# Patient Record
Sex: Female | Born: 1945
Health system: Southern US, Community
[De-identification: ages and names within clinical notes are randomized; demographics above are authoritative.]

## PROBLEM LIST (undated history)

## (undated) DIAGNOSIS — D229 Melanocytic nevi, unspecified: Secondary | ICD-10-CM

## (undated) DIAGNOSIS — M1990 Unspecified osteoarthritis, unspecified site: Secondary | ICD-10-CM

## (undated) DIAGNOSIS — R011 Cardiac murmur, unspecified: Secondary | ICD-10-CM

## (undated) DIAGNOSIS — I341 Nonrheumatic mitral (valve) prolapse: Secondary | ICD-10-CM

## (undated) DIAGNOSIS — I219 Acute myocardial infarction, unspecified: Secondary | ICD-10-CM

## (undated) DIAGNOSIS — G5600 Carpal tunnel syndrome, unspecified upper limb: Secondary | ICD-10-CM

## (undated) DIAGNOSIS — G709 Myoneural disorder, unspecified: Secondary | ICD-10-CM

## (undated) DIAGNOSIS — I8390 Asymptomatic varicose veins of unspecified lower extremity: Secondary | ICD-10-CM

## (undated) DIAGNOSIS — R519 Headache, unspecified: Secondary | ICD-10-CM

## (undated) DIAGNOSIS — F32A Depression, unspecified: Secondary | ICD-10-CM

## (undated) DIAGNOSIS — E7800 Pure hypercholesterolemia, unspecified: Secondary | ICD-10-CM

## (undated) DIAGNOSIS — H269 Unspecified cataract: Secondary | ICD-10-CM

## (undated) DIAGNOSIS — K219 Gastro-esophageal reflux disease without esophagitis: Secondary | ICD-10-CM

## (undated) DIAGNOSIS — L570 Actinic keratosis: Secondary | ICD-10-CM

## (undated) DIAGNOSIS — F329 Major depressive disorder, single episode, unspecified: Secondary | ICD-10-CM

## (undated) DIAGNOSIS — R51 Headache: Secondary | ICD-10-CM

## (undated) DIAGNOSIS — F419 Anxiety disorder, unspecified: Secondary | ICD-10-CM

## (undated) HISTORY — PX: EYE SURGERY: SHX253

## (undated) HISTORY — DX: Anxiety disorder, unspecified: F41.9

## (undated) HISTORY — DX: Unspecified cataract: H26.9

## (undated) HISTORY — DX: Melanocytic nevi, unspecified: D22.9

## (undated) HISTORY — DX: Cardiac murmur, unspecified: R01.1

## (undated) HISTORY — DX: Myoneural disorder, unspecified: G70.9

## (undated) HISTORY — PX: TUBAL LIGATION: SHX77

## (undated) HISTORY — PX: BREAST CYST ASPIRATION: SHX578

## (undated) HISTORY — DX: Acute myocardial infarction, unspecified: I21.9

## (undated) HISTORY — DX: Actinic keratosis: L57.0

---
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
## 1968-06-17 HISTORY — PX: LAPAROSCOPY: SHX197

## 1979-02-16 HISTORY — PX: BREAST CYST ASPIRATION: SHX578

## 1979-06-18 HISTORY — PX: OTHER SURGICAL HISTORY: SHX169

## 1988-06-17 HISTORY — PX: OTHER SURGICAL HISTORY: SHX169

## 2005-06-05 ENCOUNTER — Ambulatory Visit: Payer: Self-pay | Admitting: Family Medicine

## 2005-08-21 ENCOUNTER — Ambulatory Visit: Payer: Self-pay | Admitting: Family Medicine

## 2005-10-18 ENCOUNTER — Ambulatory Visit: Payer: Self-pay | Admitting: Unknown Physician Specialty

## 2005-10-23 ENCOUNTER — Ambulatory Visit: Payer: Self-pay | Admitting: Unknown Physician Specialty

## 2005-12-20 ENCOUNTER — Ambulatory Visit: Payer: Self-pay | Admitting: Family Medicine

## 2006-05-20 DIAGNOSIS — D239 Other benign neoplasm of skin, unspecified: Secondary | ICD-10-CM

## 2006-05-20 HISTORY — DX: Other benign neoplasm of skin, unspecified: D23.9

## 2006-12-23 ENCOUNTER — Ambulatory Visit: Payer: Self-pay | Admitting: Family Medicine

## 2007-12-04 ENCOUNTER — Ambulatory Visit: Payer: Self-pay | Admitting: Family Medicine

## 2007-12-14 ENCOUNTER — Ambulatory Visit: Payer: Self-pay | Admitting: Family Medicine

## 2007-12-29 ENCOUNTER — Ambulatory Visit: Payer: Self-pay | Admitting: Family Medicine

## 2008-11-07 DIAGNOSIS — F419 Anxiety disorder, unspecified: Secondary | ICD-10-CM | POA: Insufficient documentation

## 2008-11-07 DIAGNOSIS — G43909 Migraine, unspecified, not intractable, without status migrainosus: Secondary | ICD-10-CM | POA: Insufficient documentation

## 2008-12-08 ENCOUNTER — Ambulatory Visit: Payer: Self-pay | Admitting: Family Medicine

## 2008-12-08 DIAGNOSIS — M545 Low back pain, unspecified: Secondary | ICD-10-CM | POA: Insufficient documentation

## 2008-12-08 DIAGNOSIS — F32A Depression, unspecified: Secondary | ICD-10-CM | POA: Insufficient documentation

## 2008-12-29 ENCOUNTER — Ambulatory Visit: Payer: Self-pay | Admitting: Family Medicine

## 2009-09-08 DIAGNOSIS — K219 Gastro-esophageal reflux disease without esophagitis: Secondary | ICD-10-CM | POA: Insufficient documentation

## 2010-03-07 ENCOUNTER — Ambulatory Visit: Payer: Self-pay | Admitting: Family Medicine

## 2010-06-29 ENCOUNTER — Ambulatory Visit: Payer: Self-pay | Admitting: Unknown Physician Specialty

## 2010-06-29 LAB — HM COLONOSCOPY

## 2010-07-03 LAB — PATHOLOGY REPORT

## 2010-08-20 ENCOUNTER — Ambulatory Visit: Payer: Self-pay

## 2010-11-06 ENCOUNTER — Ambulatory Visit: Payer: Self-pay | Admitting: Sports Medicine

## 2011-05-02 ENCOUNTER — Ambulatory Visit: Payer: Self-pay | Admitting: Family Medicine

## 2011-05-14 ENCOUNTER — Ambulatory Visit: Payer: Self-pay | Admitting: Family Medicine

## 2011-06-06 ENCOUNTER — Ambulatory Visit: Payer: Self-pay | Admitting: Orthopedic Surgery

## 2011-06-18 HISTORY — PX: ROTATOR CUFF REPAIR: SHX139

## 2011-06-20 ENCOUNTER — Ambulatory Visit: Payer: Self-pay | Admitting: Orthopedic Surgery

## 2012-04-06 ENCOUNTER — Ambulatory Visit: Payer: Self-pay | Admitting: Family Medicine

## 2012-04-14 ENCOUNTER — Ambulatory Visit: Payer: Self-pay | Admitting: Family Medicine

## 2012-04-14 LAB — HM DEXA SCAN

## 2012-05-11 ENCOUNTER — Ambulatory Visit: Payer: Self-pay | Admitting: Family Medicine

## 2013-04-07 LAB — HM PAP SMEAR: HM Pap smear: NEGATIVE

## 2013-05-12 ENCOUNTER — Ambulatory Visit: Payer: Self-pay | Admitting: Family Medicine

## 2013-06-29 LAB — CBC AND DIFFERENTIAL
HEMATOCRIT: 40 % (ref 36–46)
Hemoglobin: 13.4 g/dL (ref 12.0–16.0)
Platelets: 287 10*3/uL (ref 150–399)
WBC: 5.6 10^3/mL

## 2013-06-29 LAB — TSH: TSH: 1.78 u[IU]/mL (ref <=5.90)

## 2014-05-18 ENCOUNTER — Ambulatory Visit: Payer: Self-pay | Admitting: Family Medicine

## 2014-05-18 LAB — HM MAMMOGRAPHY

## 2014-08-26 ENCOUNTER — Ambulatory Visit: Payer: Self-pay | Admitting: Family Medicine

## 2014-09-05 LAB — LIPID PANEL
CHOLESTEROL: 179 mg/dL (ref 0–200)
HDL: 69 mg/dL (ref 35–70)
LDL Cholesterol: 95 mg/dL
Triglycerides: 74 mg/dL (ref 40–160)

## 2014-10-07 LAB — HEPATIC FUNCTION PANEL
ALT: 8 U/L (ref 7–35)
AST: 25 U/L (ref 13–35)

## 2014-10-07 LAB — BASIC METABOLIC PANEL
BUN: 14 mg/dL (ref 4–21)
Creatinine: 1 mg/dL (ref 0.5–1.1)
Glucose: 101 mg/dL
Potassium: 4.1 mmol/L (ref 3.4–5.3)
Sodium: 142 mmol/L (ref 137–147)

## 2014-10-09 NOTE — Op Note (Signed)
PATIENT NAME:  Diana Blanchard, Diana Blanchard MR#:  [id number] DATE OF BIRTH:  19-Jul-1945  DATE OF PROCEDURE:  06/20/2011  PREOPERATIVE DIAGNOSIS: Right rotator cuff tear.   POSTOPERATIVE DIAGNOSIS: Right rotator cuff tear.   PROCEDURES:  1. Right rotator cuff repair.  2. Subacromial decompression.  ANESTHESIA: General.    SURGEON: Laurene Footman, MD   DESCRIPTION OF PROCEDURE: The patient was brought to the operating room and after adequate anesthesia was obtained the patient was placed in a semirecumbent position with a bump underneath the right shoulder blade. After prepping and draping in the usual sterile fashion a saber incision was made over the distal acromion and a deltoid on approach made to enter the subacromial space. Initial inspection revealed a great deal of bursitis within the subacromial space. This was debrided with use of a rongeur. The CA ligament was released and spur off the anterior aspect and lateral aspect of the acromion was removed with the use of an oscillating bur. At this point there was a tear noted at the anterior aspect of the rotator cuff approximately 1.5 cm in length without retraction. The tissue itself appeared to be thick around it. The tissue had obvious inflammation. The distal end of the stump was then debrided and underlying this there was a sharp spur on the greater tuberosity which was the partial cause of the impingement. This was debrided using rongeur getting fresh bleeding bone for the tendon to heal to as well as removing source of impingement. Two sutures were placed using the suture passing device from [employer]. Two suture anchors were then placed anterior and posterior using first tapping and then placing the speed screw. Speed screw was inserted. The anterior sutures were brought to the posterior anchor, tightened, and crimped with good tendon to bone apposition and then the more posterior sutures were brought to the more anterior one to get a good watertight  closure of the tendon without undue tension. After these sutures had been placed, there was stable fixation and the sutures were cut. The repair appeared stable through a range of motion. The wound was thoroughly irrigated and closed using #1 Vicryl for the deltoid, 2-0 Vicryl subcutaneously, and 4-0 nylon for the skin and a preop block. No additional local given. Xeroform, 4 x 4, ABD, tape, ACE wrap, and a shoulder sling applied. The patient was sent to the recovery room in stable condition.   ESTIMATED BLOOD LOSS: 25.   COMPLICATIONS: None.   SPECIMEN: None.   IMPLANTS: Speed screw anchor x2.  ____________________________ Laurene Footman, MD mjm:drc D: 06/20/2011 19:36:04 ET T: 06/21/2011 09:18:35 ET JOB#: [id number]  cc: Laurene Footman, MD, <Dictator> Laurene Footman MD ELECTRONICALLY SIGNED 06/21/2011 12:35

## 2015-03-03 ENCOUNTER — Telehealth: Payer: Self-pay | Admitting: Family Medicine

## 2015-03-03 DIAGNOSIS — Z1211 Encounter for screening for malignant neoplasm of colon: Secondary | ICD-10-CM | POA: Insufficient documentation

## 2015-03-03 NOTE — Telephone Encounter (Signed)
Pt's last Colonoscopy was 06/2010 with Dr. Vira Agar.    Thanks,   -Mickel Baas

## 2015-03-03 NOTE — Telephone Encounter (Signed)
Pt states [employer] has contacted her stating it is time to have her Colonoscopy.  Pt states she has an appt 06/05/2015 for CPE but was wanting to go ahead have her colonoscopy.  IC#[phone]/[id number]

## 2015-03-03 NOTE — Telephone Encounter (Signed)
Ok to put in order. Thanks.   

## 2015-04-05 ENCOUNTER — Ambulatory Visit (INDEPENDENT_AMBULATORY_CARE_PROVIDER_SITE_OTHER): Payer: Medicare Other

## 2015-04-05 DIAGNOSIS — Z23 Encounter for immunization: Secondary | ICD-10-CM

## 2015-05-31 DIAGNOSIS — K921 Melena: Secondary | ICD-10-CM | POA: Insufficient documentation

## 2015-05-31 DIAGNOSIS — R319 Hematuria, unspecified: Secondary | ICD-10-CM | POA: Insufficient documentation

## 2015-05-31 DIAGNOSIS — I341 Nonrheumatic mitral (valve) prolapse: Secondary | ICD-10-CM | POA: Insufficient documentation

## 2015-05-31 DIAGNOSIS — R011 Cardiac murmur, unspecified: Secondary | ICD-10-CM | POA: Insufficient documentation

## 2015-05-31 DIAGNOSIS — B009 Herpesviral infection, unspecified: Secondary | ICD-10-CM | POA: Insufficient documentation

## 2015-05-31 DIAGNOSIS — E7800 Pure hypercholesterolemia, unspecified: Secondary | ICD-10-CM | POA: Insufficient documentation

## 2015-05-31 DIAGNOSIS — M75100 Unspecified rotator cuff tear or rupture of unspecified shoulder, not specified as traumatic: Secondary | ICD-10-CM | POA: Insufficient documentation

## 2015-05-31 DIAGNOSIS — E875 Hyperkalemia: Secondary | ICD-10-CM | POA: Insufficient documentation

## 2015-06-02 ENCOUNTER — Ambulatory Visit (INDEPENDENT_AMBULATORY_CARE_PROVIDER_SITE_OTHER): Payer: Medicare Other | Admitting: Family Medicine

## 2015-06-02 ENCOUNTER — Encounter: Payer: Self-pay | Admitting: Family Medicine

## 2015-06-02 VITALS — BP 120/76 | HR 52 | Temp 98.1°F | Resp 16 | Ht 66.0 in | Wt 140.0 lb

## 2015-06-02 DIAGNOSIS — Z1211 Encounter for screening for malignant neoplasm of colon: Secondary | ICD-10-CM

## 2015-06-02 DIAGNOSIS — Z0000 Encounter for general adult medical examination without abnormal findings: Secondary | ICD-10-CM

## 2015-06-02 DIAGNOSIS — Z780 Asymptomatic menopausal state: Secondary | ICD-10-CM | POA: Diagnosis not present

## 2015-06-02 DIAGNOSIS — Z23 Encounter for immunization: Secondary | ICD-10-CM | POA: Diagnosis not present

## 2015-06-02 DIAGNOSIS — Z1239 Encounter for other screening for malignant neoplasm of breast: Secondary | ICD-10-CM

## 2015-06-02 NOTE — Patient Instructions (Signed)
Please call the [facility] at [facility] to schedule this at [phone]   

## 2015-06-02 NOTE — Progress Notes (Signed)
Patient ID: Mylia Eversman, female   DOB: 22-May-1946, 69 y.o.   MRN: [id number]        Patient: Jenneth Gilberti, Female    DOB: 1946-05-05, 69 y.o.   MRN: [id number] Visit Date: 06/02/2015  Today's Provider: Margarita Rana, MD   Chief Complaint  Patient presents with  . Medicare Wellness   Subjective:    Annual wellness visit Eibhlin Gornik is a 69 y.o. female. She feels well. She reports exercising active with daily activites. She reports she is sleeping well. 06/03/14 CPE 04/07/13 Pap-neg 05/18/14 Mammo-BI-RADS 1 06/29/10 Colon-internal hemorrhoids, polyps 04/14/12 BDM-osteoporosis  Lab Results  Component Value Date   WBC 5.6 06/29/2013   HGB 13.4 06/29/2013   HCT 40 06/29/2013   PLT 287 06/29/2013   CHOL 179 09/05/2014   TRIG 74 09/05/2014   HDL 69 09/05/2014   LDLCALC 95 09/05/2014   ALT 8 10/07/2014   AST 25 10/07/2014   NA 142 10/07/2014   K 4.1 10/07/2014   CREATININE 1.0 10/07/2014   BUN 14 10/07/2014   TSH 1.78 06/29/2013    -----------------------------------------------------------   Review of Systems  Constitutional: Negative.        Crying  HENT: Negative.   Eyes: Negative.   Respiratory: Negative.   Cardiovascular: Negative.   Gastrointestinal: Negative.   Endocrine: Negative.   Genitourinary: Positive for genital sores.  Musculoskeletal: Positive for back pain.  Skin: Negative.   Allergic/Immunologic: Negative.   Neurological: Negative.   Hematological: Negative.   Psychiatric/Behavioral: Negative.     Social History   Social History  . Marital Status: Married    Spouse Name: N/A  . Number of Children: N/A  . Years of Education: N/A   Occupational History  . Not on file.   Social History Main Topics  . Smoking status: Former Smoker    Quit date: 06/16/1969  . Smokeless tobacco: Never Used  . Alcohol Use: 1.8 oz/week    3 Glasses of wine per week  . Drug Use: No  . Sexual Activity: Not on file   Other Topics Concern  . Not on  file   Social History Narrative    Patient Active Problem List   Diagnosis Date Noted  . Blood in feces 05/31/2015  . Blood in the urine 05/31/2015  . Herpes simplex type 2 infection 05/31/2015  . Hypercholesterolemia 05/31/2015  . High potassium 05/31/2015  . Billowing mitral valve 05/31/2015  . Cardiac murmur 05/31/2015  . Rotator cuff syndrome 05/31/2015  . Encounter for screening colonoscopy 03/03/2015  . Acid reflux 09/08/2009  . Clinical depression 12/08/2008  . LBP (low back pain) 12/08/2008  . Anxiety 11/07/2008  . Headache, migraine 11/07/2008    Past Surgical History  Procedure Laterality Date  . Rotator cuff repair Right 06/2011  . Bowel obstruction  1990  . Tonsillectomy and adenoidectomy  1953  . Laparoscopy  123XX123    complications from IUD  . Cyst removed  1981    on the ovary    Her family history includes Arthritis in her mother; CVA in her father; Diabetes in her father; HIV in her brother; Hyperlipidemia in her mother; Hypertension in her mother.    Previous Medications   ASCORBIC ACID (VITAMIN C) 500 MG CAPS    Take 1 capsule by mouth daily.   CHOLECALCIFEROL (VITAMIN D PO)    Take 1 tablet by mouth daily.   ESCITALOPRAM (LEXAPRO) 20 MG TABLET    Take 1 tablet by mouth daily.  OMEGA-3 FATTY ACIDS (OMEGA-3 FISH OIL) 1200 MG CAPS    Take 1 capsule by mouth daily.   SIMVASTATIN (ZOCOR) 20 MG TABLET    Take 1 tablet by mouth daily.   VALACYCLOVIR (VALTREX) 500 MG TABLET    Take 500 mg by mouth 2 (two) times daily.    Patient Care Team: Margarita Rana, MD as PCP - General (Family Medicine)     Objective:   Vitals: BP 120/76 mmHg  Pulse 52  Temp(Src) 98.1 F (36.7 C) (Oral)  Resp 16  Ht 5\' 6"  (1.676 m)  Wt 140 lb (63.504 kg)  BMI 22.61 kg/m2  SpO2 98%  Physical Exam  Constitutional: She is oriented to person, place, and time. She appears well-developed and well-nourished.  HENT:  Head: Normocephalic and atraumatic.  Right Ear: Tympanic  membrane, external ear and ear canal normal.  Left Ear: Tympanic membrane, external ear and ear canal normal.  Nose: Nose normal.  Mouth/Throat: Uvula is midline, oropharynx is clear and moist and mucous membranes are normal.  Eyes: Conjunctivae, EOM and lids are normal. Pupils are equal, round, and reactive to light.  Neck: Trachea normal and normal range of motion. Neck supple. Carotid bruit is not present. No thyroid mass and no thyromegaly present.  Cardiovascular: Normal rate and regular rhythm.   Murmur heard. Pulmonary/Chest: Effort normal and breath sounds normal.  Abdominal: Soft. Normal appearance and bowel sounds are normal. There is no hepatosplenomegaly. There is no tenderness.  Genitourinary: No breast swelling, tenderness or discharge. There is lesion on the left labia.  Musculoskeletal: Normal range of motion.  Lymphadenopathy:    She has no cervical adenopathy.    She has no axillary adenopathy.  Neurological: She is alert and oriented to person, place, and time. She has normal strength. No cranial nerve deficit.  Skin: Skin is warm, dry and intact.  Psychiatric: She has a normal mood and affect. Her speech is normal and behavior is normal. Judgment and thought content normal. Cognition and memory are normal.    Activities of Daily Living In your present state of health, do you have any difficulty performing the following activities: 06/02/2015  Hearing? N  Vision? N  Difficulty concentrating or making decisions? N  Walking or climbing stairs? N  Dressing or bathing? N  Doing errands, shopping? N    Fall Risk Assessment Fall Risk  06/02/2015  Falls in the past year? No     Depression Screen PHQ 2/9 Scores 06/02/2015  PHQ - 2 Score 3  PHQ- 9 Score 3    Cognitive Testing - 6-CIT  Correct? Score   What year is it? yes 0 0 or 4  What month is it? yes 0 0 or 3  Memorize:    Pia Mau,  43,  [place],      What time is it? (within 1 hour) yes 0 0  or 3  Count backwards from 20 yes 0 0, 2, or 4  Name the months of the year yes 0 0, 2, or 4  Repeat name & address above yes 0 0, 2, 4, 6, 8, or 10       TOTAL SCORE  0/28   Interpretation:  Normal  Normal (0-7) Abnormal (8-28)       Assessment & Plan:     Annual Wellness Visit  Reviewed patient's Family Medical History Reviewed and updated list of patient's medical providers Assessment of cognitive impairment was done Assessed patient's functional ability  Established a written schedule for health screening [place] Completed and Reviewed  Exercise Activities and Dietary recommendations Goals    . Exercise 150 minutes per week (moderate activity)       Immunization History  Administered Date(s) Administered  . Influenza, High Dose Seasonal PF 04/05/2015  . Pneumococcal Conjugate-13 06/03/2014  . Pneumococcal Polysaccharide-23 06/17/1998, 04/07/2013  . Tdap 04/09/2005  . Zoster 01/13/2008       1. Medicare annual wellness visit, subsequent Stable. Patient advised to continue eating healthy and exercise daily.  2. Breast cancer screening - MM DIGITAL SCREENING BILATERAL; Future  3. Colon cancer screening - Ambulatory referral to Gastroenterology  4. Postmenopausal - DG Bone Density; Future  5. Need for Tdap vaccination - Tdap vaccine greater than or equal to 7yo IM     Patient seen and examined by Dr. Jerrell Belfast, and note scribed by Philbert Riser. Dimas, CMA. I have reviewed the document for accuracy and completeness and I agree with above. Jerrell Belfast, MD   Margarita Rana, MD    ------------------------------------------------------------------------------------------------------------

## 2015-06-05 ENCOUNTER — Encounter: Payer: Self-pay | Admitting: Family Medicine

## 2015-06-22 ENCOUNTER — Ambulatory Visit
Admission: RE | Admit: 2015-06-22 | Discharge: 2015-06-22 | Disposition: A | Discharge status: Home / Self Care | Payer: Medicare Other | Source: Ambulatory Visit | Attending: Family Medicine | Admitting: Family Medicine

## 2015-06-22 ENCOUNTER — Telehealth: Payer: Self-pay

## 2015-06-22 DIAGNOSIS — Z780 Asymptomatic menopausal state: Secondary | ICD-10-CM

## 2015-06-22 DIAGNOSIS — Z1231 Encounter for screening mammogram for malignant neoplasm of breast: Secondary | ICD-10-CM | POA: Insufficient documentation

## 2015-06-22 DIAGNOSIS — Z1239 Encounter for other screening for malignant neoplasm of breast: Secondary | ICD-10-CM

## 2015-06-22 NOTE — Telephone Encounter (Signed)
LMTCB Alaiza Yau Drozdowski, CMA  

## 2015-06-22 NOTE — Telephone Encounter (Signed)
-----   Message from Margarita Rana, MD sent at 06/22/2015  2:17 PM EST ----- Early stages of osteoporosis.   Recommend follow up ov to review.  Not emergent. Thanks.

## 2015-06-23 NOTE — Telephone Encounter (Signed)
Informed pt of results and scheduled appointment. Renaldo Fiddler, CMA

## 2015-06-28 ENCOUNTER — Encounter: Payer: Self-pay | Admitting: *Deleted

## 2015-06-29 ENCOUNTER — Ambulatory Visit: Payer: Medicare Other | Admitting: Anesthesiology

## 2015-06-29 ENCOUNTER — Ambulatory Visit: Admission: AD | Admit: 2015-06-29 | Payer: Self-pay | Source: Ambulatory Visit | Admitting: Unknown Physician Specialty

## 2015-06-29 ENCOUNTER — Encounter: Admission: AD | Payer: Self-pay | Source: Ambulatory Visit

## 2015-06-29 ENCOUNTER — Encounter: Payer: Self-pay | Admitting: *Deleted

## 2015-06-29 ENCOUNTER — Encounter
Admission: RE | Disposition: A | Discharge status: Home / Self Care | Payer: Self-pay | Source: Ambulatory Visit | Attending: Unknown Physician Specialty

## 2015-06-29 ENCOUNTER — Ambulatory Visit
Admission: RE | Admit: 2015-06-29 | Discharge: 2015-06-29 | Disposition: A | Discharge status: Home / Self Care | Payer: Medicare Other | Source: Ambulatory Visit | Attending: Unknown Physician Specialty | Admitting: Unknown Physician Specialty

## 2015-06-29 DIAGNOSIS — Z8261 Family history of arthritis: Secondary | ICD-10-CM | POA: Insufficient documentation

## 2015-06-29 DIAGNOSIS — E7800 Pure hypercholesterolemia, unspecified: Secondary | ICD-10-CM | POA: Diagnosis not present

## 2015-06-29 DIAGNOSIS — I059 Rheumatic mitral valve disease, unspecified: Secondary | ICD-10-CM | POA: Diagnosis not present

## 2015-06-29 DIAGNOSIS — Z8489 Family history of other specified conditions: Secondary | ICD-10-CM | POA: Diagnosis not present

## 2015-06-29 DIAGNOSIS — Z833 Family history of diabetes mellitus: Secondary | ICD-10-CM | POA: Diagnosis not present

## 2015-06-29 DIAGNOSIS — K640 First degree hemorrhoids: Secondary | ICD-10-CM | POA: Diagnosis not present

## 2015-06-29 DIAGNOSIS — Z823 Family history of stroke: Secondary | ICD-10-CM | POA: Diagnosis not present

## 2015-06-29 DIAGNOSIS — Z8249 Family history of ischemic heart disease and other diseases of the circulatory system: Secondary | ICD-10-CM | POA: Insufficient documentation

## 2015-06-29 DIAGNOSIS — R51 Headache: Secondary | ICD-10-CM | POA: Insufficient documentation

## 2015-06-29 DIAGNOSIS — F329 Major depressive disorder, single episode, unspecified: Secondary | ICD-10-CM | POA: Insufficient documentation

## 2015-06-29 DIAGNOSIS — K219 Gastro-esophageal reflux disease without esophagitis: Secondary | ICD-10-CM | POA: Diagnosis not present

## 2015-06-29 DIAGNOSIS — F419 Anxiety disorder, unspecified: Secondary | ICD-10-CM | POA: Diagnosis not present

## 2015-06-29 DIAGNOSIS — Z79899 Other long term (current) drug therapy: Secondary | ICD-10-CM | POA: Diagnosis not present

## 2015-06-29 DIAGNOSIS — Z86010 Personal history of colonic polyps: Secondary | ICD-10-CM | POA: Diagnosis present

## 2015-06-29 DIAGNOSIS — Z87891 Personal history of nicotine dependence: Secondary | ICD-10-CM | POA: Insufficient documentation

## 2015-06-29 HISTORY — DX: Depression, unspecified: F32.A

## 2015-06-29 HISTORY — DX: Gastro-esophageal reflux disease without esophagitis: K21.9

## 2015-06-29 HISTORY — DX: Headache, unspecified: R51.9

## 2015-06-29 HISTORY — PX: COLONOSCOPY WITH PROPOFOL: SHX5780

## 2015-06-29 HISTORY — DX: Major depressive disorder, single episode, unspecified: F32.9

## 2015-06-29 HISTORY — DX: Pure hypercholesterolemia, unspecified: E78.00

## 2015-06-29 HISTORY — DX: Nonrheumatic mitral (valve) prolapse: I34.1

## 2015-06-29 HISTORY — DX: Headache: R51

## 2015-06-29 SURGERY — COLONOSCOPY WITH PROPOFOL
Anesthesia: General | Laterality: Bilateral

## 2015-06-29 SURGERY — COLONOSCOPY WITH PROPOFOL
Anesthesia: General

## 2015-06-29 MED ORDER — SODIUM CHLORIDE 0.9 % IV SOLN
INTRAVENOUS | Status: DC | PRN
  Administered 2015-06-29: 11:00:00 via INTRAVENOUS

## 2015-06-29 MED ORDER — SODIUM CHLORIDE 0.9 % IV SOLN
INTRAVENOUS | Status: DC
  Administered 2015-06-29: 10:00:00 via INTRAVENOUS

## 2015-06-29 MED ORDER — PROPOFOL 10 MG/ML IV BOLUS
INTRAVENOUS | Status: DC | PRN
  Administered 2015-06-29: 150 mg via INTRAVENOUS

## 2015-06-29 MED ORDER — SODIUM CHLORIDE 0.9 % IV SOLN: INTRAVENOUS | Status: DC

## 2015-06-29 NOTE — Transfer of Care (Signed)
Immediate Anesthesia Transfer of Care Note  Patient: Diana Blanchard  Procedure(s) Performed: Procedure(s): COLONOSCOPY WITH PROPOFOL (N/A)  Patient Location: PACU  Anesthesia Type:General  Level of Consciousness: awake, alert  and oriented  Airway & Oxygen Therapy: Patient Spontanous Breathing and Patient connected to nasal cannula oxygen  Post-op Assessment: Report given to RN and Post -op Vital signs reviewed and stable  Post vital signs: Reviewed and stable  Last Vitals:  Filed Vitals:   06/29/15 0947 06/29/15 1112  BP: 123/79 96/64  Pulse: 54 47  Temp: 36.3 C 36.4 C  Resp: 14 16    Complications: No apparent anesthesia complications

## 2015-06-29 NOTE — Op Note (Signed)
[facility] Patient Name: Diana Blanchard Procedure Date: 06/29/2015 10:36 AM MRN: [id number] Account #: [id number] Date of Birth: [birth year] Admit Type: Outpatient Age: 70 Room: [facility] ENDO ROOM 1 Gender: Female Note Status: Finalized Procedure:         Colonoscopy Indications:       High risk colon cancer surveillance: Personal history of                     colonic polyps Providers:         Manya Silvas, MD Referring MD:      Jerrell Belfast, MD (Referring MD) Medicines:         Propofol per Anesthesia Complications:     No immediate complications. Procedure:         Pre-Anesthesia Assessment:                    - After reviewing the risks and benefits, the patient was                     deemed in satisfactory condition to undergo the procedure.                    After obtaining informed consent, the colonoscope was                     passed under direct vision. Throughout the procedure, the                     patient's blood pressure, pulse, and oxygen saturations                     were monitored continuously. The Colonoscope was                     introduced through the anus and advanced to the the cecum,                     identified by appendiceal orifice and ileocecal valve. The                     colonoscopy was somewhat difficult due to significant                     looping and a tortuous colon. Successful completion of the                     procedure was aided by applying abdominal pressure. The                     patient tolerated the procedure well. The quality of the                     bowel preparation was excellent. Findings:      Internal hemorrhoids were found during endoscopy. The hemorrhoids were       small and Grade I (internal hemorrhoids that do not prolapse). The       sigmoid was difficult and required withdrawal and reinsertion.      The exam was otherwise without abnormality. Impression:        -  Internal hemorrhoids.                    - The examination was otherwise normal.                    -  No specimens collected. Recommendation:    - Repeat colonoscopy in 5 years for surveillance. Manya Silvas, MD 06/29/2015 11:11:24 AM This report has been signed electronically. Number of Addenda: 0 Note Initiated On: 06/29/2015 10:36 AM Scope Withdrawal Time: 0 hours 6 minutes 29 seconds  Total Procedure Duration: 0 hours 22 minutes 10 seconds       [facility]

## 2015-06-29 NOTE — Anesthesia Preprocedure Evaluation (Signed)
Anesthesia Evaluation  Patient identified by MRN, date of birth, ID band Patient awake    Reviewed: Allergy & Precautions, H&P , NPO status , Patient's Chart, lab work & pertinent test results, reviewed documented beta blocker date and time   Airway Mallampati: II  TM Distance: >3 FB Neck ROM: full    Dental no notable dental hx. (+) Teeth Intact   Pulmonary neg pulmonary ROS, former smoker,    Pulmonary exam normal breath sounds clear to auscultation       Cardiovascular Exercise Tolerance: Good negative cardio ROS   Rhythm:regular Rate:Normal     Neuro/Psych  Headaches, PSYCHIATRIC DISORDERS negative neurological ROS  negative psych ROS   GI/Hepatic negative GI ROS, Neg liver ROS, GERD  ,  Endo/Other  negative endocrine ROS  Renal/GU negative Renal ROS  negative genitourinary   Musculoskeletal   Abdominal   Peds  Hematology negative hematology ROS (+)   Anesthesia Other Findings   Reproductive/Obstetrics negative OB ROS                             Anesthesia Physical Anesthesia Plan  ASA: III  Anesthesia Plan: General   Post-op Pain Management:    Induction:   Airway Management Planned:   Additional Equipment:   Intra-op Plan:   Post-operative Plan:   Informed Consent: I have reviewed the patients History and Physical, chart, labs and discussed the procedure including the risks, benefits and alternatives for the proposed anesthesia with the patient or authorized representative who has indicated his/her understanding and acceptance.   Dental Advisory Given  Plan Discussed with: CRNA  Anesthesia Plan Comments:         Anesthesia Quick Evaluation

## 2015-06-29 NOTE — H&P (Signed)
   Primary Care Physician:  Margarita Rana, MD Primary Gastroenterologist:  Dr. Vira Agar  Pre-Procedure History & Physical: HPI:  Diana Blanchard is a 70 y.o. female is here for an colonoscopy.   Past Medical History  Diagnosis Date  . Anxiety   . Hypercholesterolemia   . Billowing mitral valve   . GERD (gastroesophageal reflux disease)   . Depression   . Headache     Past Surgical History  Procedure Laterality Date  . Rotator cuff repair Right 06/2011  . Bowel obstruction  1990  . Tonsillectomy and adenoidectomy  1953  . Laparoscopy  123XX123    complications from IUD  . Cyst removed  1981    on the ovary  . Breast cyst aspiration Right 1980's    Prior to Admission medications   Medication Sig Start Date End Date Taking? Authorizing Provider  escitalopram (LEXAPRO) 20 MG tablet Take 1 tablet by mouth daily. 07/01/14  Yes Historical Provider, MD  simvastatin (ZOCOR) 20 MG tablet Take 1 tablet by mouth daily. 07/01/14  Yes Historical Provider, MD  Ascorbic Acid (VITAMIN C) 500 MG CAPS Take 1 capsule by mouth daily.    Historical Provider, MD  Cholecalciferol (VITAMIN D PO) Take 1 tablet by mouth daily. 09/08/09   Historical Provider, MD  Omega-3 Fatty Acids (OMEGA-3 FISH OIL) 1200 MG CAPS Take 1 capsule by mouth daily. 03/01/10   Historical Provider, MD  valACYclovir (VALTREX) 500 MG tablet Take 500 mg by mouth 2 (two) times daily. 05/24/15   Historical Provider, MD    Allergies as of 06/27/2015  . (No Known Allergies)    Family History  Problem Relation Age of Onset  . Arthritis Mother   . Hyperlipidemia Mother   . Hypertension Mother   . Diabetes Father   . CVA Father   . HIV Brother   . Breast cancer Neg Hx     Social History   Social History  . Marital Status: Married    Spouse Name: N/A  . Number of Children: N/A  . Years of Education: N/A   Occupational History  . Not on file.   Social History Main Topics  . Smoking status: Former Smoker    Quit date:  06/16/1969  . Smokeless tobacco: Never Used  . Alcohol Use: 1.8 oz/week    3 Glasses of wine per week  . Drug Use: No  . Sexual Activity: Not on file   Other Topics Concern  . Not on file   Social History Narrative    Review of Systems: See HPI, otherwise negative ROS  Physical Exam: BP 123/79 mmHg  Pulse 54  Temp(Src) 97.4 F (36.3 C) (Tympanic)  Resp 14  Ht 5\' 6"  (1.676 m)  Wt 62.143 kg (137 lb)  BMI 22.12 kg/m2  SpO2 100% General:   Alert,  pleasant and cooperative in NAD Head:  Normocephalic and atraumatic. Neck:  Supple; no masses or thyromegaly. Lungs:  Clear throughout to auscultation.    Heart:  Regular rate and rhythm. Abdomen:  Soft, nontender and nondistended. Normal bowel sounds, without guarding, and without rebound.   Neurologic:  Alert and  oriented x4;  grossly normal neurologically.  Impression/Plan: Diana Blanchard is here for an colonoscopy to be performed for [facility] colon polyps  Risks, benefits, limitations, and alternatives regarding  colonoscopy have been reviewed with the patient.  Questions have been answered.  All parties agreeable.   Gaylyn Cheers, MD  06/29/2015, 10:35 AM

## 2015-07-02 NOTE — Anesthesia Postprocedure Evaluation (Signed)
Anesthesia Post Note  Patient: Diana Blanchard  Procedure(s) Performed: Procedure(s) (LRB): COLONOSCOPY WITH PROPOFOL (N/A)  Patient location during evaluation: PACU Anesthesia Type: General Level of consciousness: awake and alert Pain management: pain level controlled Vital Signs Assessment: post-procedure vital signs reviewed and stable Respiratory status: spontaneous breathing, nonlabored ventilation, respiratory function stable and patient connected to nasal cannula oxygen Cardiovascular status: blood pressure returned to baseline and stable Postop Assessment: no signs of nausea or vomiting Anesthetic complications: no    Last Vitals:  Filed Vitals:   06/29/15 1130 06/29/15 1140  BP: 109/79 123/77  Pulse:    Temp:    Resp:      Last Pain:  Filed Vitals:   06/30/15 0754  PainSc: 0-No pain                 Molli Barrows

## 2015-07-04 ENCOUNTER — Ambulatory Visit (INDEPENDENT_AMBULATORY_CARE_PROVIDER_SITE_OTHER): Payer: Medicare Other | Admitting: Family Medicine

## 2015-07-04 ENCOUNTER — Encounter: Payer: Self-pay | Admitting: Family Medicine

## 2015-07-04 VITALS — BP 106/64 | HR 56 | Temp 98.8°F | Resp 16 | Ht 66.0 in | Wt 142.0 lb

## 2015-07-04 DIAGNOSIS — M810 Age-related osteoporosis without current pathological fracture: Secondary | ICD-10-CM | POA: Diagnosis not present

## 2015-07-04 MED ORDER — ASPIRIN 81 MG PO TABS: 81.0000 mg | ORAL_TABLET | Freq: Every day | ORAL | Status: DC

## 2015-07-04 NOTE — Patient Instructions (Signed)
Alendronate tablets What is this medicine? ALENDRONATE (a LEN droe nate) slows calcium loss from bones. It helps to make normal healthy bone and to slow bone loss in people with Paget's disease and osteoporosis. It may be used in others at risk for bone loss. This medicine may be used for other purposes; ask your health care provider or pharmacist if you have questions. What should I tell my health care provider before I take this medicine? They need to know if you have any of these conditions: -dental disease -esophagus, stomach, or intestine problems, like acid reflux or GERD -kidney disease -low blood calcium -low vitamin D -problems sitting or standing 30 minutes -trouble swallowing -an unusual or allergic reaction to alendronate, other medicines, foods, dyes, or preservatives -pregnant or trying to get pregnant -breast-feeding How should I use this medicine? You must take this medicine exactly as directed or you will lower the amount of the medicine you absorb into your body or you may cause yourself harm. Take this medicine by mouth first thing in the morning, after you are up for the day. Do not eat or drink anything before you take your medicine. Swallow the tablet with a full glass (6 to 8 fluid ounces) of plain water. Do not take this medicine with any other drink. Do not chew or crush the tablet. After taking this medicine, do not eat breakfast, drink, or take any medicines or vitamins for at least 30 minutes. Sit or stand up for at least 30 minutes after you take this medicine; do not lie down. Do not take your medicine more often than directed. Talk to your pediatrician regarding the use of this medicine in children. Special care may be needed. Overdosage: If you think you have taken too much of this medicine contact a poison control center or emergency room at once. NOTE: This medicine is only for you. Do not share this medicine with others. What if I miss a dose? If you miss a  dose, do not take it later in the day. Continue your normal schedule starting the next morning. Do not take double or extra doses. What may interact with this medicine? -aluminum hydroxide -antacids -aspirin -calcium supplements -drugs for inflammation like ibuprofen, naproxen, and others -iron supplements -magnesium supplements -vitamins with minerals This list may not describe all possible interactions. Give your health care provider a list of all the medicines, herbs, non-prescription drugs, or dietary supplements you use. Also tell them if you smoke, drink alcohol, or use illegal drugs. Some items may interact with your medicine. What should I watch for while using this medicine? Visit your doctor or health care professional for regular checks ups. It may be some time before you see benefit from this medicine. Do not stop taking your medicine except on your doctor's advice. Your doctor or health care professional may order blood tests and other tests to see how you are doing. You should make sure you get enough calcium and vitamin D while you are taking this medicine, unless your doctor tells you not to. Discuss the foods you eat and the vitamins you take with your health care professional. Some people who take this medicine have severe bone, joint, and/or muscle pain. This medicine may also increase your risk for a broken thigh bone. Tell your doctor right away if you have pain in your upper leg or groin. Tell your doctor if you have any pain that does not go away or that gets worse. This medicine can make  you more sensitive to the sun. If you get a rash while taking this medicine, sunlight may cause the rash to get worse. Keep out of the sun. If you cannot avoid being in the sun, wear protective clothing and use sunscreen. Do not use sun lamps or tanning beds/booths. What side effects may I notice from receiving this medicine? Side effects that you should report to your doctor or health care  professional as soon as possible: -allergic reactions like skin rash, itching or hives, swelling of the face, lips, or tongue -black or tarry stools -bone, muscle or joint pain -changes in vision -chest pain -heartburn or stomach pain -jaw pain, especially after dental work -pain or trouble when swallowing -redness, blistering, peeling or loosening of the skin, including inside the mouth Side effects that usually do not require medical attention (report to your doctor or health care professional if they continue or are bothersome): -changes in taste -diarrhea or constipation -eye pain or itching -headache -nausea or vomiting -stomach gas or fullness This list may not describe all possible side effects. Call your doctor for medical advice about side effects. You may report side effects to FDA at 1-800-FDA-1088. Where should I keep my medicine? Keep out of the reach of children. Store at room temperature of 15 and 30 degrees C (59 and 86 degrees F). Throw away any unused medicine after the expiration date. NOTE: This sheet is a summary. It may not cover all possible information. If you have questions about this medicine, talk to your doctor, pharmacist, or health care provider.    2016, Elsevier/Gold Standard. (2010-11-30 08:56:09) Osteoporosis Osteoporosis is the thinning and loss of density in the bones. Osteoporosis makes the bones more brittle, fragile, and likely to break (fracture). Over time, osteoporosis can cause the bones to become so weak that they fracture after a simple fall. The bones most likely to fracture are the bones in the hip, wrist, and spine. CAUSES  The exact cause is not known. RISK FACTORS Anyone can develop osteoporosis. You may be at greater risk if you have a family history of the condition or have poor nutrition. You may also have a higher risk if you are:   Female.   17 years old or older.  A smoker.  Not physically active.   White or  Asian.  Slender. SIGNS AND SYMPTOMS  A fracture might be the first sign of the disease, especially if it results from a fall or injury that would not usually cause a bone to break. Other signs and symptoms include:   Low back and neck pain.  Stooped posture.  Height loss. DIAGNOSIS  To make a diagnosis, your health care provider may:  Take a medical history.  Perform a physical exam.  Order tests, such as:  A bone mineral density test.  A dual-energy X-ray absorptiometry test. TREATMENT  The goal of osteoporosis treatment is to strengthen your bones to reduce your risk of a fracture. Treatment may involve:  Making lifestyle changes, such as:  Eating a diet rich in calcium.  Doing weight-bearing and muscle-strengthening exercises.  Stopping tobacco use.  Limiting alcohol intake.  Taking medicine to slow the process of bone loss or to increase bone density.  Monitoring your levels of calcium and vitamin D. HOME CARE INSTRUCTIONS  Include calcium and vitamin D in your diet. Calcium is important for bone health, and vitamin D helps the body absorb calcium.  Perform weight-bearing and muscle-strengthening exercises as directed by your health care  provider.  Do not use any tobacco products, including cigarettes, chewing tobacco, and electronic cigarettes. If you need help quitting, ask your health care provider.  Limit your alcohol intake.  Take medicines only as directed by your health care provider.  Keep all follow-up visits as directed by your health care provider. This is important.  Take precautions at home to lower your risk of falling, such as:  Keeping rooms well lit and clutter free.  Installing safety rails on stairs.  Using rubber mats in the bathroom and other areas that are often wet or slippery. SEEK IMMEDIATE MEDICAL CARE IF:  You fall or injure yourself.    This information is not intended to replace advice given to you by your health care  provider. Make sure you discuss any questions you have with your health care provider.   Document Released: 03/13/2005 Document Revised: 06/24/2014 Document Reviewed: 11/11/2013 Elsevier Interactive Patient Education [employer].

## 2015-07-04 NOTE — Progress Notes (Signed)
Patient ID: Diana Blanchard, female   DOB: 1946/01/26, 70 y.o.   MRN: [id number]        Patient: Diana Blanchard Female    DOB: [birth year]   70 y.o.   MRN: [id number] Visit Date: 07/04/2015  Today's Provider: Margarita Rana, MD   Chief Complaint  Patient presents with  . Osteoporosis   Subjective:    HPI   Osteoporosis: Patient complains of osteoporosis. She was diagnosed with osteoporosis by bone density scan in 06/22/2015 . Patient denies history of fracture.The cause of osteoporosis is felt to be due to postmenopausal estrogen deficiency.   She is currently being treated with calcium and vitamin D supplementation.  She is not currently being treated with bisphosphonates  Osteoporosis Risk Factors  Nonmodifiable Personal Hx of fracture as an adult: no Hx of fracture in first-degree relative: no   Caucasian race: yes  Advanced age: yes Female sex: yes Dementia: no Poor health/frailty: no  Potentially modifiable: Tobacco use: no Low body weight (<127 lbs): no Estrogen deficiency  early menopause (age <45) or bilateral ovariectomy: Pt is unsure  prolonged premenopausal amenorrhea (>1 yr): no Low calcium intake (lifelong): no Alcoholism: no Recurrent falls: yes Inadequate physical activity: yes  Current calcium and Vit D intake: Dietary sources: Yes.  Supplements: Taking Vitamin D 1,000 Units a day.      Allergies  Allergen Reactions  . Latex Itching  . Neosporin [Neomycin-Bacitracin Zn-Polymyx]    Previous Medications   ASCORBIC ACID (VITAMIN C) 500 MG CAPS    Take 1 capsule by mouth daily.   CHOLECALCIFEROL (VITAMIN D PO)    Take 1 tablet by mouth daily.   ESCITALOPRAM (LEXAPRO) 20 MG TABLET    Take 1 tablet by mouth daily.   OMEGA-3 FATTY ACIDS (OMEGA-3 FISH OIL) 1200 MG CAPS    Take 1 capsule by mouth daily.   SIMVASTATIN (ZOCOR) 20 MG TABLET    Take 1 tablet by mouth daily.   VALACYCLOVIR (VALTREX) 500 MG TABLET    Take 500 mg by mouth 2 (two) times daily.     Review of Systems  Constitutional: Negative.   Musculoskeletal: Negative.     Social History  Substance Use Topics  . Smoking status: Former Smoker    Quit date: 06/16/1969  . Smokeless tobacco: Never Used  . Alcohol Use: 1.8 oz/week    3 Glasses of wine per week   Objective:   BP 106/64 mmHg  Pulse 56  Temp(Src) 98.8 F (37.1 C) (Oral)  Resp 16  Ht 5\' 6"  (1.676 m)  Wt 142 lb (64.411 kg)  BMI 22.93 kg/m2  Physical Exam  Constitutional: She is oriented to person, place, and time. She appears well-developed and well-nourished.  Neurological: She is alert and oriented to person, place, and time.  Psychiatric: She has a normal mood and affect. Her behavior is normal. Judgment and thought content normal.      Assessment & Plan:     1. Osteoporosis New diagnosis.   Discussed risks and benefits of Fosamax. Patient will think about it and call me with her plan.  Spent over 30 minutes in direct patient counseling.   - VITAMIN D 25 Hydroxy (Vit-D Deficiency, Fractures)     Patient was seen and examined by Jerrell Belfast, MD, and note scribed by Ashley Royalty, CMA.    Margarita Rana, MD  [facility]

## 2015-08-11 DIAGNOSIS — M810 Age-related osteoporosis without current pathological fracture: Secondary | ICD-10-CM | POA: Diagnosis not present

## 2015-08-12 LAB — VITAMIN D 25 HYDROXY (VIT D DEFICIENCY, FRACTURES): Vit D, 25-Hydroxy: 38.3 ng/mL (ref 30.0–100.0)

## 2015-08-14 ENCOUNTER — Telehealth: Payer: Self-pay

## 2015-08-14 NOTE — Telephone Encounter (Signed)
Advised pt of lab results. Pt verbally acknowledges understanding. Emily Drozdowski, CMA   

## 2015-08-14 NOTE — Telephone Encounter (Signed)
-----   Message from Margarita Rana, MD sent at 08/12/2015  7:39 AM EST ----- Vit D stable. Please notify patient. Thanks.

## 2015-08-15 ENCOUNTER — Other Ambulatory Visit: Payer: Self-pay | Admitting: Family Medicine

## 2015-08-15 DIAGNOSIS — B009 Herpesviral infection, unspecified: Secondary | ICD-10-CM

## 2015-08-16 ENCOUNTER — Other Ambulatory Visit: Payer: Self-pay | Admitting: Family Medicine

## 2015-08-16 MED ORDER — ESCITALOPRAM OXALATE 20 MG PO TABS: 20.0000 mg | ORAL_TABLET | Freq: Every day | ORAL | Status: DC

## 2015-08-16 MED ORDER — SIMVASTATIN 20 MG PO TABS: 20.0000 mg | ORAL_TABLET | Freq: Every day | ORAL | Status: DC

## 2015-08-16 NOTE — Telephone Encounter (Signed)
Last refills for both medications was 07/01/2014.  Last OV was 07/04/2015

## 2015-08-16 NOTE — Telephone Encounter (Signed)
Pt needs refill on   escitalopram (LEXAPRO) 20 MG tablet  simvastatin (ZOCOR) 20 MG tablet  [facility].  Her call back is [phone]  Thanks Con Memos

## 2015-08-22 ENCOUNTER — Other Ambulatory Visit: Payer: Self-pay

## 2015-08-22 DIAGNOSIS — E7800 Pure hypercholesterolemia, unspecified: Secondary | ICD-10-CM

## 2015-08-22 DIAGNOSIS — F419 Anxiety disorder, unspecified: Secondary | ICD-10-CM

## 2015-08-22 MED ORDER — ESCITALOPRAM OXALATE 20 MG PO TABS: 20.0000 mg | ORAL_TABLET | Freq: Every day | ORAL | Status: DC

## 2015-08-22 MED ORDER — SIMVASTATIN 20 MG PO TABS: 20.0000 mg | ORAL_TABLET | Freq: Every day | ORAL | Status: DC

## 2015-08-24 ENCOUNTER — Other Ambulatory Visit: Payer: Self-pay

## 2015-08-24 DIAGNOSIS — F419 Anxiety disorder, unspecified: Secondary | ICD-10-CM

## 2015-08-24 DIAGNOSIS — E7800 Pure hypercholesterolemia, unspecified: Secondary | ICD-10-CM

## 2015-08-24 MED ORDER — ESCITALOPRAM OXALATE 20 MG PO TABS: 20.0000 mg | ORAL_TABLET | Freq: Every day | ORAL | Status: DC

## 2015-08-24 MED ORDER — SIMVASTATIN 20 MG PO TABS: 20.0000 mg | ORAL_TABLET | Freq: Every day | ORAL | Status: DC

## 2015-09-06 DIAGNOSIS — Z85828 Personal history of other malignant neoplasm of skin: Secondary | ICD-10-CM | POA: Diagnosis not present

## 2015-09-06 DIAGNOSIS — L821 Other seborrheic keratosis: Secondary | ICD-10-CM | POA: Diagnosis not present

## 2015-09-06 DIAGNOSIS — Z1283 Encounter for screening for malignant neoplasm of skin: Secondary | ICD-10-CM | POA: Diagnosis not present

## 2015-09-06 DIAGNOSIS — D1800 Hemangioma unspecified site: Secondary | ICD-10-CM | POA: Diagnosis not present

## 2015-09-06 DIAGNOSIS — L812 Freckles: Secondary | ICD-10-CM | POA: Diagnosis not present

## 2015-09-06 DIAGNOSIS — D229 Melanocytic nevi, unspecified: Secondary | ICD-10-CM | POA: Diagnosis not present

## 2015-09-06 DIAGNOSIS — D485 Neoplasm of uncertain behavior of skin: Secondary | ICD-10-CM | POA: Diagnosis not present

## 2015-09-06 DIAGNOSIS — L578 Other skin changes due to chronic exposure to nonionizing radiation: Secondary | ICD-10-CM | POA: Diagnosis not present

## 2015-09-06 DIAGNOSIS — L720 Epidermal cyst: Secondary | ICD-10-CM | POA: Diagnosis not present

## 2015-09-06 DIAGNOSIS — L820 Inflamed seborrheic keratosis: Secondary | ICD-10-CM | POA: Diagnosis not present

## 2016-04-11 ENCOUNTER — Ambulatory Visit (INDEPENDENT_AMBULATORY_CARE_PROVIDER_SITE_OTHER): Payer: Medicare Other

## 2016-04-11 DIAGNOSIS — Z23 Encounter for immunization: Secondary | ICD-10-CM | POA: Diagnosis not present

## 2016-05-14 ENCOUNTER — Ambulatory Visit (INDEPENDENT_AMBULATORY_CARE_PROVIDER_SITE_OTHER): Payer: Medicare Other | Admitting: Physician Assistant

## 2016-05-14 ENCOUNTER — Telehealth: Payer: Self-pay

## 2016-05-14 ENCOUNTER — Ambulatory Visit
Admission: RE | Admit: 2016-05-14 | Discharge: 2016-05-14 | Disposition: A | Discharge status: Home / Self Care | Payer: Medicare Other | Source: Ambulatory Visit | Attending: Physician Assistant | Admitting: Physician Assistant

## 2016-05-14 ENCOUNTER — Encounter: Payer: Self-pay | Admitting: Physician Assistant

## 2016-05-14 VITALS — BP 102/64 | HR 54 | Temp 97.8°F | Resp 16 | Wt 148.0 lb

## 2016-05-14 DIAGNOSIS — M25561 Pain in right knee: Secondary | ICD-10-CM | POA: Insufficient documentation

## 2016-05-14 DIAGNOSIS — M179 Osteoarthritis of knee, unspecified: Secondary | ICD-10-CM | POA: Diagnosis not present

## 2016-05-14 DIAGNOSIS — M769 Unspecified enthesopathy, lower limb, excluding foot: Secondary | ICD-10-CM | POA: Insufficient documentation

## 2016-05-14 NOTE — Telephone Encounter (Signed)
-----   Message from Trinna Post, [place] sent at 05/14/2016  1:19 PM EST ----- Mild arthritic changes but no acute abnormality on knee xray. Have put in orthopedic referral, patient will be contacted by Judson Roch.

## 2016-05-14 NOTE — Progress Notes (Signed)
Patient: Diana Blanchard Female    DOB: 03-18-1946   70 y.o.   MRN: [id number] Visit Date: 05/14/2016  Today's Provider: Trinna Post, PA-C   Chief Complaint  Patient presents with  . Knee Pain    Started in October   Subjective:    Knee Pain   There was no injury mechanism. The pain is present in the right knee. The quality of the pain is described as aching. The pain is at a severity of 5/10 (Can be as high as 7/10. ). Pertinent negatives include no inability to bear weight, loss of motion, loss of sensation, muscle weakness, numbness or tingling. The symptoms are aggravated by palpation and movement (Climbing steps.). She has tried nothing for the symptoms.   Patient is a 70 y/o female with history of hyperextension injury to her right knee many years ago presenting today with pain in the right knee. She reports the pain started in October with no injury mechanism that she can remember. She was on a trip to the beach and woke up one morning with her right knee hurting. She denies history of arthritis. She describes it as an aching on the inside of her right knee that gets worse with climbing stairs. She reports walking without issues but does feel her right knee buckling on occasion.   Allergies  Allergen Reactions  . Latex Itching  . Neosporin [Neomycin-Bacitracin Zn-Polymyx]      Current Outpatient Prescriptions:  .  Ascorbic Acid (VITAMIN C) 500 MG CAPS, Take 1 capsule by mouth daily., Disp: , Rfl:  .  Cholecalciferol (VITAMIN D PO), Take 1 tablet by mouth daily., Disp: , Rfl:  .  escitalopram (LEXAPRO) 20 MG tablet, Take 1 tablet (20 mg total) by mouth daily., Disp: 90 tablet, Rfl: 1 .  simvastatin (ZOCOR) 20 MG tablet, Take 1 tablet (20 mg total) by mouth daily., Disp: 90 tablet, Rfl: 1 .  valACYclovir (VALTREX) 500 MG tablet, TAKE ONE TABLET BY MOUTH TWICE DAILY, Disp: 12 tablet, Rfl: 5 .  aspirin 81 MG tablet, Take 1 tablet (81 mg total) by mouth daily. (Patient not  taking: Reported on 05/14/2016), Disp: 1 tablet, Rfl: 0  Review of Systems  Constitutional: Negative.   Musculoskeletal: Positive for arthralgias. Negative for back pain, gait problem, joint swelling, myalgias, neck pain and neck stiffness.  Neurological: Negative for dizziness, tingling, light-headedness, numbness and headaches.    Social History  Substance Use Topics  . Smoking status: Former Smoker    Quit date: 06/16/1969  . Smokeless tobacco: Never Used  . Alcohol use 1.8 oz/week    3 Glasses of wine per week   Objective:   BP 102/64 (BP Location: Right Arm, Patient Position: Sitting, Cuff Size: Normal)   Pulse (!) 54   Temp 97.8 F (36.6 C) (Oral)   Resp 16   Wt 148 lb (67.1 kg)   BMI 23.89 kg/m   Physical Exam  Constitutional: She is oriented to person, place, and time. She appears well-developed and well-nourished.  Cardiovascular: Normal rate.   Musculoskeletal: Normal range of motion. She exhibits tenderness. She exhibits no edema or deformity.       Right knee: She exhibits normal range of motion, no swelling, no effusion, no ecchymosis, no deformity, no laceration, no erythema, normal alignment, no LCL laxity, normal patellar mobility, no bony tenderness, normal meniscus and no MCL laxity. Tenderness found. MCL tenderness noted. No medial joint line, no lateral joint line and  no LCL tenderness noted.       Left knee: Normal.       Left ankle: Normal.  There is some tenderness along the right MCL. No laxity upon anterior or posterior drawer tests. No MCL or LCL laxity, but pain upon testing for this in the medial knee area. Some crepitus felt bilateral knees upon ROM. Negative McMurray test on right leg.  Neurological: She is alert and oriented to person, place, and time.        Assessment & Plan:      Problem List Items Addressed This Visit    None    Visit Diagnoses    Right knee pain, unspecified chronicity    -  Primary   Relevant Orders   DG Knee  Complete 4 Views Right     Patient is 70 y/o female presenting with right knee pain concerning for soft tissue injury. Does have a history of injury in this knee. Will get XRAY to evaluate bony etiology. Informed patient she probably has some baseline arthritis, but this does not sound like the cause of her pain. Will refer to ortho for further evaluation pending XRAY. She has not tried anything for pain, may use Tylenol for relief.   Return if symptoms worsen or fail to improve.  Patient Instructions  Knee Pain Knee pain is a very common symptom and can have many causes. Knee pain often goes away when you follow your health care provider's instructions for relieving pain and discomfort at home. However, knee pain can develop into a condition that needs treatment. Some conditions may include:  Arthritis caused by wear and tear (osteoarthritis).  Arthritis caused by swelling and irritation (rheumatoid arthritis or gout).  A cyst or growth in your knee.  An infection in your knee joint.  An injury that will not heal.  Damage, swelling, or irritation of the tissues that support your knee (torn ligaments or tendinitis). If your knee pain continues, additional tests may be ordered to diagnose your condition. Tests may include X-rays or other imaging studies of your knee. You may also need to have fluid removed from your knee. Treatment for ongoing knee pain depends on the cause, but treatment may include:  Medicines to relieve pain or swelling.  Steroid injections in your knee.  Physical therapy.  Surgery. HOME CARE INSTRUCTIONS  Take medicines only as directed by your health care provider.  Rest your knee and keep it raised (elevated) while you are resting.  Do not do things that cause or worsen pain.  Avoid high-impact activities or exercises, such as running, jumping rope, or doing jumping jacks.  Apply ice to the knee area:  Put ice in a plastic bag.  Place a towel between  your skin and the bag.  Leave the ice on for 20 minutes, 2-3 times a day.  Ask your health care provider if you should wear an elastic knee support.  Keep a pillow under your knee when you sleep.  Lose weight if you are overweight. Extra weight can put pressure on your knee.  Do not use any tobacco products, including cigarettes, chewing tobacco, or electronic cigarettes. If you need help quitting, ask your health care provider. Smoking may slow the healing of any bone and joint problems that you may have. SEEK MEDICAL CARE IF:  Your knee pain continues, changes, or gets worse.  You have a fever along with knee pain.  Your knee buckles or locks up.  Your knee becomes more  swollen. SEEK IMMEDIATE MEDICAL CARE IF:   Your knee joint feels hot to the touch.  You have chest pain or trouble breathing. This information is not intended to replace advice given to you by your health care provider. Make sure you discuss any questions you have with your health care provider. Document Released: 03/31/2007 Document Revised: 06/24/2014 Document Reviewed: 01/17/2014 Elsevier Interactive Patient Education  2017 [employer].    The entirety of the information documented in the History of Present Illness, Review of Systems and Physical Exam were personally obtained by me. Portions of this information were initially documented by Ashley Royalty, CMA and reviewed by me for thoroughness and accuracy.         Trinna Post, PA-C  [facility]

## 2016-05-14 NOTE — Telephone Encounter (Signed)
Patient has been notified. KW

## 2016-05-14 NOTE — Telephone Encounter (Signed)
[id number] 

## 2016-05-14 NOTE — Patient Instructions (Signed)

## 2016-06-07 ENCOUNTER — Ambulatory Visit (INDEPENDENT_AMBULATORY_CARE_PROVIDER_SITE_OTHER): Payer: Medicare Other | Admitting: Physician Assistant

## 2016-06-07 ENCOUNTER — Encounter: Payer: Self-pay | Admitting: Physician Assistant

## 2016-06-07 VITALS — BP 110/76 | HR 60 | Temp 98.3°F | Resp 16 | Ht 66.0 in | Wt 146.0 lb

## 2016-06-07 DIAGNOSIS — Z1239 Encounter for other screening for malignant neoplasm of breast: Secondary | ICD-10-CM

## 2016-06-07 DIAGNOSIS — B009 Herpesviral infection, unspecified: Secondary | ICD-10-CM | POA: Diagnosis not present

## 2016-06-07 DIAGNOSIS — R0789 Other chest pain: Secondary | ICD-10-CM | POA: Diagnosis not present

## 2016-06-07 DIAGNOSIS — Z0000 Encounter for general adult medical examination without abnormal findings: Secondary | ICD-10-CM | POA: Diagnosis not present

## 2016-06-07 DIAGNOSIS — E7800 Pure hypercholesterolemia, unspecified: Secondary | ICD-10-CM | POA: Diagnosis not present

## 2016-06-07 DIAGNOSIS — Z1231 Encounter for screening mammogram for malignant neoplasm of breast: Secondary | ICD-10-CM | POA: Diagnosis not present

## 2016-06-07 DIAGNOSIS — E875 Hyperkalemia: Secondary | ICD-10-CM | POA: Diagnosis not present

## 2016-06-07 DIAGNOSIS — F419 Anxiety disorder, unspecified: Secondary | ICD-10-CM | POA: Diagnosis not present

## 2016-06-07 MED ORDER — ESCITALOPRAM OXALATE 20 MG PO TABS: 20.0000 mg | ORAL_TABLET | Freq: Every day | ORAL | 1 refills | Status: DC

## 2016-06-07 MED ORDER — VALACYCLOVIR HCL 500 MG PO TABS: 500.0000 mg | ORAL_TABLET | Freq: Two times a day (BID) | ORAL | 3 refills | Status: DC

## 2016-06-07 MED ORDER — SIMVASTATIN 20 MG PO TABS: 20.0000 mg | ORAL_TABLET | Freq: Every day | ORAL | 1 refills | Status: DC

## 2016-06-07 NOTE — Progress Notes (Deleted)
Patient: Diana Blanchard, Female    DOB: May 03, 1946, 70 y.o.   MRN: [id number] Visit Date: 06/07/2016  Today's Provider: Mar Daring, PA-C   No chief complaint on file.  Subjective:    Annual physical exam Diana Blanchard is a 70 y.o. female who presents today for health maintenance and complete physical. She feels {DESC; WELL/FAIRLY WELL/POORLY:18703}. She reports exercising ***. She reports she is sleeping {DESC; WELL/FAIRLY WELL/POORLY:18703}.  Last CPE- 06/02/2015 Last BMD- 06/22/2015- OP Last mammo- 06/22/2015- BI-RADS 1 Last pap- 04/07/2013- negative -----------------------------------------------------------------   Review of Systems  Social History      She  reports that she quit smoking about 47 years ago. She has never used smokeless tobacco. She reports that she drinks about 1.8 oz of alcohol per week . She reports that she does not use drugs.       Social History   Social History  . Marital status: Married    Spouse name: N/A  . Number of children: N/A  . Years of education: N/A   Social History Main Topics  . Smoking status: Former Smoker    Quit date: 06/16/1969  . Smokeless tobacco: Never Used  . Alcohol use 1.8 oz/week    3 Glasses of wine per week  . Drug use: No  . Sexual activity: Not on file   Other Topics Concern  . Not on file   Social History Narrative  . No narrative on file    Past Medical History:  Diagnosis Date  . Anxiety   . Billowing mitral valve   . Depression   . GERD (gastroesophageal reflux disease)   . Headache   . Hypercholesterolemia      Patient Active Problem List   Diagnosis Date Noted  . Blood in the urine 05/31/2015  . Herpes simplex type 2 infection 05/31/2015  . Hypercholesterolemia 05/31/2015  . High potassium 05/31/2015  . Billowing mitral valve 05/31/2015  . Rotator cuff syndrome 05/31/2015  . Encounter for screening colonoscopy 03/03/2015  . Acid reflux 09/08/2009  . Clinical depression  12/08/2008  . LBP (low back pain) 12/08/2008  . Anxiety 11/07/2008  . Headache, migraine 11/07/2008    Past Surgical History:  Procedure Laterality Date  . bowel obstruction  1990  . BREAST CYST ASPIRATION Right 1980's  . COLONOSCOPY WITH PROPOFOL N/A 06/29/2015   Procedure: COLONOSCOPY WITH PROPOFOL;  Surgeon: Manya Silvas, MD;  Location: [facility] END[facility]COPY;  Service: Endoscopy;  Laterality: N/A;  . cyst removed  1981   on the ovary  . LAPAROSCOPY  [id number]   complications from IUD  . ROTATOR CUFF REPAIR Right 06/2011  . TONSILLECTOMY AND ADENOIDECTOMY  1953    Family History        Family Status  Relation Status  . Mother Alive  . Father Deceased at age 50   MI  . Brother Deceased        Her family history includes Arthritis in her mother; CVA in her father; Diabetes in her father; HIV in her brother; Hyperlipidemia in her mother; Hypertension in her mother.     Allergies  Allergen Reactions  . Latex Itching  . Neosporin [Neomycin-Bacitracin Zn-Polymyx]      Current Outpatient Prescriptions:  .  Ascorbic Acid (VITAMIN C) 500 MG CAPS, Take 1 capsule by mouth daily., Disp: , Rfl:  .  aspirin 81 MG tablet, Take 1 tablet (81 mg total) by mouth daily. (Patient not taking: Reported on 05/14/2016), Disp: 1  tablet, Rfl: 0 .  Cholecalciferol (VITAMIN D PO), Take 1 tablet by mouth daily., Disp: , Rfl:  .  escitalopram (LEXAPRO) 20 MG tablet, Take 1 tablet (20 mg total) by mouth daily., Disp: 90 tablet, Rfl: 1 .  simvastatin (ZOCOR) 20 MG tablet, Take 1 tablet (20 mg total) by mouth daily., Disp: 90 tablet, Rfl: 1 .  valACYclovir (VALTREX) 500 MG tablet, TAKE ONE TABLET BY MOUTH TWICE DAILY, Disp: 12 tablet, Rfl: 5   Patient Care Team: Mar Daring, PA-C as PCP - General (Family Medicine)      Objective:   Vitals: There were no vitals taken for this visit.   Physical Exam   Depression Screen PHQ 2/9 Scores 06/02/2015  PHQ - 2 Score 3  PHQ- 9 Score 3       Assessment & Plan:     Routine Health Maintenance and Physical Exam  Exercise Activities and Dietary recommendations Goals    . Exercise 150 minutes per week (moderate activity)       Immunization History  Administered Date(s) Administered  . Influenza, High Dose Seasonal PF 04/05/2015, 04/11/2016  . Pneumococcal Conjugate-13 06/03/2014  . Pneumococcal Polysaccharide-23 04/07/2013  . Tdap 04/09/2005, 06/02/2015  . Zoster 01/13/2008    Health Maintenance  Topic Date Due  . Hepatitis C Screening  [birth year]  . MAMMOGRAM  06/21/2017  . TETANUS/TDAP  06/01/2025  . COLONOSCOPY  06/28/2025  . INFLUENZA VACCINE  Completed  . DEXA SCAN  Completed  . ZOSTAVAX  Completed  . PNA vac Low Risk Adult  Completed     Discussed health benefits of physical activity, and encouraged her to engage in regular exercise appropriate for her age and condition.    --------------------------------------------------------------------  Patient seen and examined by Mar Daring, PA-C, and note scribed by Renaldo Fiddler, CMA.   Mar Daring, PA-C  [facility]

## 2016-06-07 NOTE — Progress Notes (Signed)
Patient: Diana Blanchard, Female    DOB: 04-12-1946, 70 y.o.   MRN: [id number] Visit Date: 06/07/2016  Today's Provider: Mar Daring, PA-C   Chief Complaint  Patient presents with  . Medicare Wellness   Subjective:    Annual wellness visit Diana Blanchard is a 70 y.o. female. She feels well. She reports exercising daily. Walks for 1 mile per day. She reports she is sleeping well.  Last AWV- 06/02/2015 Last BMD- 06/22/2015- OP Last pap- 04/07/2013- negative Last mammogram- 06/22/2015- negative. Last colonoscopy- 06/29/2015- Internal hemorrhoids. Otherwise without abnormality. Repeat 5 years for surveillance. Dr. Vira Agar Immunizations are UTD.  -----------------------------------------------------------   Review of Systems  Constitutional: Negative.   HENT: Negative.   Eyes: Negative.   Respiratory: Negative.   Cardiovascular: Negative.   Gastrointestinal: Negative.   Endocrine: Negative.   Genitourinary: Positive for genital sores. Negative for decreased urine volume, difficulty urinating, dyspareunia, dysuria, enuresis, flank pain, frequency, hematuria, menstrual problem, pelvic pain, urgency, vaginal bleeding, vaginal discharge and vaginal pain.  Musculoskeletal: Positive for neck stiffness. Negative for arthralgias, back pain, gait problem, joint swelling, myalgias and neck pain.  Skin: Negative.   Allergic/Immunologic: Negative.   Neurological: Positive for numbness. Negative for dizziness, tremors, seizures, syncope, facial asymmetry, speech difficulty, weakness, light-headedness and headaches.  Hematological: Negative.   Psychiatric/Behavioral: Negative.     Social History   Social History  . Marital status: Married    Spouse name: Merry Proud  . Number of children: 1  . Years of education: business college   Occupational History  . Rertired    Social History Main Topics  . Smoking status: Former Smoker    Quit date: 06/16/1969  . Smokeless tobacco:  Never Used  . Alcohol use 1.8 oz/week    3 Glasses of wine per week  . Drug use: No  . Sexual activity: Not on file   Other Topics Concern  . Not on file   Social History Narrative  . No narrative on file    Past Medical History:  Diagnosis Date  . Anxiety   . Billowing mitral valve   . Depression   . GERD (gastroesophageal reflux disease)   . Headache   . Hypercholesterolemia      Patient Active Problem List   Diagnosis Date Noted  . Blood in the urine 05/31/2015  . Herpes simplex type 2 infection 05/31/2015  . Hypercholesterolemia 05/31/2015  . High potassium 05/31/2015  . Billowing mitral valve 05/31/2015  . Rotator cuff syndrome 05/31/2015  . Encounter for screening colonoscopy 03/03/2015  . Acid reflux 09/08/2009  . Clinical depression 12/08/2008  . LBP (low back pain) 12/08/2008  . Anxiety 11/07/2008  . Headache, migraine 11/07/2008    Past Surgical History:  Procedure Laterality Date  . bowel obstruction  1990  . BREAST CYST ASPIRATION Right 1980's  . COLONOSCOPY WITH PROPOFOL N/A 06/29/2015   Procedure: COLONOSCOPY WITH PROPOFOL;  Surgeon: Manya Silvas, MD;  Location: [facility] ENDOSCOPY;  Service: Endoscopy;  Laterality: N/A;  . cyst removed  1981   on the ovary  . LAPAROSCOPY  [id number]   complications from IUD  . ROTATOR CUFF REPAIR Right 06/2011  . TONSILLECTOMY AND ADENOIDECTOMY  1953    Her family history includes Arthritis in her mother; CVA in her father; Diabetes in her father; HIV in her brother; Hyperlipidemia in her mother; Hypertension in her mother.      Current Outpatient Prescriptions:  .  Ascorbic Acid (VITAMIN C) 500  MG CAPS, Take 1 capsule by mouth daily., Disp: , Rfl:  .  Cholecalciferol (VITAMIN D PO), Take 1 tablet by mouth daily., Disp: , Rfl:  .  escitalopram (LEXAPRO) 20 MG tablet, Take 1 tablet (20 mg total) by mouth daily., Disp: 90 tablet, Rfl: 1 .  simvastatin (ZOCOR) 20 MG tablet, Take 1 tablet (20 mg total) by mouth daily.,  Disp: 90 tablet, Rfl: 1 .  valACYclovir (VALTREX) 500 MG tablet, TAKE ONE TABLET BY MOUTH TWICE DAILY, Disp: 12 tablet, Rfl: 5 .  aspirin 81 MG tablet, Take 1 tablet (81 mg total) by mouth daily. (Patient not taking: Reported on 06/07/2016), Disp: 1 tablet, Rfl: 0  Patient Care Team: Mar Daring, PA-C as PCP - General (Family Medicine)     Objective:   Vitals: BP 110/76 (BP Location: Left Arm, Patient Position: Sitting, Cuff Size: Normal)   Pulse 60   Temp 98.3 F (36.8 C) (Oral)   Resp 16   Ht 5\' 6"  (1.676 m)   Wt 146 lb (66.2 kg)   BMI 23.57 kg/m   Physical Exam  Constitutional: She is oriented to person, place, and time. She appears well-developed and well-nourished. No distress.  HENT:  Head: Normocephalic and atraumatic.  Right Ear: Tympanic membrane, external ear and ear canal normal.  Left Ear: Tympanic membrane, external ear and ear canal normal.  Nose: Nose normal.  Mouth/Throat: Uvula is midline, oropharynx is clear and moist and mucous membranes are normal. No oropharyngeal exudate.  Eyes: Conjunctivae and EOM are normal. Pupils are equal, round, and reactive to light. Right eye exhibits no discharge. Left eye exhibits no discharge. No scleral icterus.  Neck: Normal range of motion. Neck supple. No JVD present. No tracheal deviation present. No thyromegaly present.  Cardiovascular: Normal rate, regular rhythm, normal heart sounds and intact distal pulses.  Exam reveals no gallop and no friction rub.   No murmur heard. Pulmonary/Chest: Effort normal and breath sounds normal. No respiratory distress. She has no decreased breath sounds. She has no wheezes. She has no rales. She exhibits no tenderness. Right breast exhibits no inverted nipple, no mass, no nipple discharge, no skin change and no tenderness. Left breast exhibits no inverted nipple, no mass, no nipple discharge, no skin change and no tenderness. Breasts are symmetrical.  Abdominal: Soft. Bowel sounds are  normal. She exhibits no distension and no mass. There is no tenderness. There is no rebound and no guarding.  Musculoskeletal: Normal range of motion. She exhibits no edema or tenderness.  Lymphadenopathy:    She has no cervical adenopathy.  Neurological: She is alert and oriented to person, place, and time.  Skin: Skin is warm and dry. No rash noted. She is not diaphoretic.  Psychiatric: She has a normal mood and affect. Her behavior is normal. Judgment and thought content normal.  Vitals reviewed.   Activities of Daily Living In your present state of health, do you have any difficulty performing the following activities: 06/07/2016  Hearing? N  Vision? N  Difficulty concentrating or making decisions? N  Walking or climbing stairs? N  Dressing or bathing? N  Doing errands, shopping? N  Some recent data might be hidden    Fall Risk Assessment Fall Risk  06/07/2016 06/02/2015  Falls in the past year? No No     Depression Screen PHQ 2/9 Scores 06/07/2016 06/02/2015  PHQ - 2 Score 1 3  PHQ- 9 Score - 3    Cognitive Testing - 6-CIT  Correct?  Score   What year is it? yes 0 0 or 4  What month is it? yes 0 0 or 3  Memorize:    Pia Mau,  43,  [address],  [place],      What time is it? (within 1 hour) yes 0 0 or 3  Count backwards from 20 yes 0 0, 2, or 4  Name the months of the year yes 0 0, 2, or 4  Repeat name & address above yes 0 0, 2, 4, 6, 8, or 10       TOTAL SCORE  0/28   Interpretation:  Normal  Normal (0-7) Abnormal (8-28)       Assessment & Plan:     Annual Wellness Visit  Reviewed patient's Family Medical History Reviewed and updated list of patient's medical providers Assessment of cognitive impairment was done Assessed patient's functional ability Established a written schedule for health screening [place] Completed and Reviewed  Exercise Activities and Dietary recommendations Goals    . Exercise 150 minutes per week  (moderate activity)       Immunization History  Administered Date(s) Administered  . Influenza, High Dose Seasonal PF 04/05/2015, 04/11/2016  . Pneumococcal Conjugate-13 06/03/2014  . Pneumococcal Polysaccharide-23 04/07/2013  . Tdap 04/09/2005, 06/02/2015  . Zoster 01/13/2008    Health Maintenance  Topic Date Due  . Hepatitis C Screening  [birth year]  . MAMMOGRAM  06/21/2017  . TETANUS/TDAP  06/01/2025  . COLONOSCOPY  06/28/2025  . INFLUENZA VACCINE  Completed  . DEXA SCAN  Completed  . ZOSTAVAX  Completed  . PNA vac Low Risk Adult  Completed     Discussed health benefits of physical activity, and encouraged her to engage in regular exercise appropriate for her age and condition.    1. Medicare annual wellness visit, subsequent Normal physical exam.  2. Breast cancer screening Breast exam today was normal. There is no family history of breast cancer. She does perform regular self breast exams. Mammogram was ordered as below. Information for [facility] clinic was given to patient so she may schedule her mammogram at her convenience. - MM Digital Screening; Future  3. Hypercholesterolemia Will check labs as below and f/u pending results. - CBC with Differential/Platelet - TSH - Lipid panel - simvastatin (ZOCOR) 20 MG tablet; Take 1 tablet (20 mg total) by mouth daily.  Dispense: 90 tablet; Refill: 1  4. High potassium Will check labs as below and f/u pending results. - Comprehensive metabolic panel  5. Anxiety Stable. Diagnosis pulled for medication refill. Continue current medical treatment plan. - escitalopram (LEXAPRO) 20 MG tablet; Take 1 tablet (20 mg total) by mouth daily.  Dispense: 90 tablet; Refill: 1  6. Herpes simplex type 2 infection Stable. Diagnosis pulled for medication refill. Continue current medical treatment plan. - valACYclovir (VALTREX) 500 MG tablet; Take 1 tablet (500 mg total) by mouth 2 (two) times daily. As needed  Dispense: 30 tablet;  Refill: 3  7. Other chest pain EKG stable. Noted sinus bradycardia. No heart block noted. No ST elevation. Patient is not symptomatic.  - EKG 12-Lead  ------------------------------------------------------------------------------------------------------------  Patient seen and examined by Mar Daring, PA-C, and note scribed by Renaldo Fiddler, CMA.  Mar Daring, PA-C  [facility]

## 2016-06-07 NOTE — Patient Instructions (Signed)

## 2016-06-11 DIAGNOSIS — M25561 Pain in right knee: Secondary | ICD-10-CM | POA: Diagnosis not present

## 2016-06-11 DIAGNOSIS — M2391 Unspecified internal derangement of right knee: Secondary | ICD-10-CM | POA: Diagnosis not present

## 2016-06-13 DIAGNOSIS — E875 Hyperkalemia: Secondary | ICD-10-CM | POA: Diagnosis not present

## 2016-06-13 DIAGNOSIS — E7800 Pure hypercholesterolemia, unspecified: Secondary | ICD-10-CM | POA: Diagnosis not present

## 2016-06-14 ENCOUNTER — Telehealth: Payer: Self-pay

## 2016-06-14 LAB — COMPREHENSIVE METABOLIC PANEL
A/G RATIO: 2 (ref 1.2–2.2)
ALK PHOS: 73 IU/L (ref 39–117)
ALT: 11 IU/L (ref 0–32)
AST: 23 IU/L (ref 0–40)
Albumin: 4.2 g/dL (ref 3.6–4.8)
BILIRUBIN TOTAL: 0.3 mg/dL (ref 0.0–1.2)
BUN/Creatinine Ratio: 17 (ref 12–28)
BUN: 15 mg/dL (ref 8–27)
CHLORIDE: 103 mmol/L (ref 96–106)
CO2: 27 mmol/L (ref 18–29)
Calcium: 9.6 mg/dL (ref 8.7–10.3)
Creatinine, Ser: 0.88 mg/dL (ref 0.57–1.00)
GFR calc Af Amer: 78 mL/min/{1.73_m2} (ref >59)
GFR calc non Af Amer: 67 mL/min/{1.73_m2} (ref >59)
GLUCOSE: 81 mg/dL (ref 65–99)
Globulin, Total: 2.1 g/dL (ref 1.5–4.5)
POTASSIUM: 4.7 mmol/L (ref 3.5–5.2)
Sodium: 144 mmol/L (ref 134–144)
Total Protein: 6.3 g/dL (ref 6.0–8.5)

## 2016-06-14 LAB — LIPID PANEL
CHOL/HDL RATIO: 2.7 ratio (ref 0.0–4.4)
CHOLESTEROL TOTAL: 195 mg/dL (ref 100–199)
HDL: 73 mg/dL (ref >39)
LDL Calculated: 111 mg/dL — ABNORMAL HIGH (ref 0–99)
TRIGLYCERIDES: 56 mg/dL (ref 0–149)
VLDL Cholesterol Cal: 11 mg/dL (ref 5–40)

## 2016-06-14 LAB — CBC WITH DIFFERENTIAL/PLATELET
BASOS ABS: 0 10*3/uL (ref 0.0–0.2)
BASOS: 1 %
EOS (ABSOLUTE): 0.3 10*3/uL (ref 0.0–0.4)
Eos: 6 %
Hematocrit: 37.8 % (ref 34.0–46.6)
Hemoglobin: 12.6 g/dL (ref 11.1–15.9)
IMMATURE GRANULOCYTES: 0 %
Immature Grans (Abs): 0 10*3/uL (ref 0.0–0.1)
Lymphocytes Absolute: 1.7 10*3/uL (ref 0.7–3.1)
Lymphs: 36 %
MCH: 32 pg (ref 26.6–33.0)
MCHC: 33.3 g/dL (ref 31.5–35.7)
MCV: 96 fL (ref 79–97)
MONOS ABS: 0.5 10*3/uL (ref 0.1–0.9)
Monocytes: 10 %
NEUTROS ABS: 2.3 10*3/uL (ref 1.4–7.0)
NEUTROS PCT: 47 %
PLATELETS: 246 10*3/uL (ref 150–379)
RBC: 3.94 x10E6/uL (ref 3.77–5.28)
RDW: 12.7 % (ref 12.3–15.4)
WBC: 4.8 10*3/uL (ref 3.4–10.8)

## 2016-06-14 LAB — TSH: TSH: 2.52 u[IU]/mL (ref 0.450–4.500)

## 2016-06-14 NOTE — Telephone Encounter (Signed)
Patient advised as below.  

## 2016-06-14 NOTE — Telephone Encounter (Signed)
-----   Message from Mar Daring, [place] sent at 06/14/2016  8:18 AM EST ----- All labs are within normal limits and stable.  Thanks! -JB

## 2016-06-25 ENCOUNTER — Ambulatory Visit
Admission: RE | Admit: 2016-06-25 | Discharge: 2016-06-25 | Disposition: A | Discharge status: Home / Self Care | Payer: Medicare Other | Source: Ambulatory Visit | Attending: Physician Assistant | Admitting: Physician Assistant

## 2016-06-25 DIAGNOSIS — Z1239 Encounter for other screening for malignant neoplasm of breast: Secondary | ICD-10-CM

## 2016-06-25 DIAGNOSIS — Z1231 Encounter for screening mammogram for malignant neoplasm of breast: Secondary | ICD-10-CM | POA: Diagnosis not present

## 2016-08-06 ENCOUNTER — Ambulatory Visit (INDEPENDENT_AMBULATORY_CARE_PROVIDER_SITE_OTHER): Payer: Medicare Other | Admitting: Physician Assistant

## 2016-08-06 VITALS — BP 102/60 | HR 68 | Temp 98.5°F | Resp 16 | Wt 143.0 lb

## 2016-08-06 DIAGNOSIS — A084 Viral intestinal infection, unspecified: Secondary | ICD-10-CM | POA: Diagnosis not present

## 2016-08-06 MED ORDER — PROMETHAZINE HCL 25 MG PO TABS: 25.0000 mg | ORAL_TABLET | Freq: Three times a day (TID) | ORAL | 0 refills | Status: DC | PRN

## 2016-08-06 NOTE — Patient Instructions (Signed)
Viral Gastroenteritis, Adult Viral gastroenteritis is also known as the stomach flu. This condition is caused by various viruses. These viruses can be passed from person to person very easily (are very contagious). This condition may affect your stomach, small intestine, and large intestine. It can cause sudden watery diarrhea, fever, and vomiting. Diarrhea and vomiting can make you feel weak and cause you to become dehydrated. You may not be able to keep fluids down. Dehydration can make you tired and thirsty, cause you to have a dry mouth, and decrease how often you urinate. Older adults and people with other diseases or a weak immune system are at higher risk for dehydration. It is important to replace the fluids that you lose from diarrhea and vomiting. If you become severely dehydrated, you may need to get fluids through an IV tube. What are the causes? Gastroenteritis is caused by various viruses, including rotavirus and norovirus. Norovirus is the most common cause in adults. You can get sick by eating food, drinking water, or touching a surface contaminated with one of these viruses. You can also get sick from sharing utensils or other personal items with an infected person. What increases the risk? This condition is more likely to develop in people:  Who have a weak defense system (immune system).  Who live with one or more children who are younger than 34 years old.  Who live in a nursing home.  Who go on cruise ships. What are the signs or symptoms? Symptoms of this condition start suddenly 1-2 days after exposure to a virus. Symptoms may last a few days or as long as a week. The most common symptoms are watery diarrhea and vomiting. Other symptoms include:  Fever.  Headache.  Fatigue.  Pain in the abdomen.  Chills.  Weakness.  Nausea.  Muscle aches.  Loss of appetite. How is this diagnosed? This condition is diagnosed with a medical history and physical exam. You may  also have a stool test to check for viruses or other infections. How is this treated? This condition typically goes away on its own. The focus of treatment is to restore lost fluids (rehydration). Your health care provider may recommend that you take an oral rehydration solution (ORS) to replace important salts and minerals (electrolytes) in your body. Severe cases of this condition may require giving fluids through an IV tube. Treatment may also include medicine to help with your symptoms. Follow these instructions at home: Follow instructions from your health care provider about how to care for yourself at home. Eating and drinking Follow these recommendations as told by your health care provider:  Take an ORS. This is a drink that is sold at pharmacies and retail stores.  Drink clear fluids in small amounts as you are able. Clear fluids include water, ice chips, diluted fruit juice, and low-calorie sports drinks.  Eat bland, easy-to-digest foods in small amounts as you are able. These foods include bananas, applesauce, rice, lean meats, toast, and crackers.  Avoid fluids that contain a lot of sugar or caffeine, such as energy drinks, sports drinks, and soda.  Avoid alcohol.  Avoid spicy or fatty foods. General instructions  Drink enough fluid to keep your urine clear or pale yellow.  Wash your hands often. If soap and water are not available, use hand sanitizer.  Make sure that all people in your household wash their hands well and often.  Take over-the-counter and prescription medicines only as told by your health care provider.  Rest  at home while you recover.  Watch your condition for any changes.  Take a warm bath to relieve any burning or pain from frequent diarrhea episodes.  Keep all follow-up visits as told by your health care provider. This is important. Contact a health care provider if:  You cannot keep fluids down.  Your symptoms get worse.  You have new  symptoms.  You feel light-headed or dizzy.  You have muscle cramps. Get help right away if:  You have chest pain.  You feel extremely weak or you faint.  You see blood in your vomit.  Your vomit looks like coffee grounds.  You have bloody or black stools or stools that look like tar.  You have a severe headache, a stiff neck, or both.  You have a rash.  You have severe pain, cramping, or bloating in your abdomen.  You have trouble breathing or you are breathing very quickly.  Your heart is beating very quickly.  Your skin feels cold and clammy.  You feel confused.  You have pain when you urinate.  You have signs of dehydration, such as:  Dark urine, very little urine, or no urine.  Cracked lips.  Dry mouth.  Sunken eyes.  Sleepiness.  Weakness. This information is not intended to replace advice given to you by your health care provider. Make sure you discuss any questions you have with your health care provider. Document Released: 06/03/2005 Document Revised: 11/15/2015 Document Reviewed: 02/07/2015 Elsevier Interactive Patient Education  2017 [place] Choices to Help Relieve Diarrhea, Adult When you have diarrhea, the foods you eat and your eating habits are very important. Choosing the right foods and drinks can help relieve diarrhea. Also, because diarrhea can last up to 7 days, you need to replace lost fluids and electrolytes (such as sodium, potassium, and chloride) in order to help prevent dehydration. What general guidelines do I need to follow?  Slowly drink 1 cup (8 oz) of fluid for each episode of diarrhea. If you are getting enough fluid, your urine will be clear or pale yellow.  Eat starchy foods. Some good choices include white rice, white toast, pasta, low-fiber cereal, baked potatoes (without the skin), saltine crackers, and bagels.  Avoid large servings of any cooked vegetables.  Limit fruit to two servings per day. A serving is   cup or 1 small piece.  Choose foods with less than 2 g of fiber per serving.  Limit fats to less than 8 tsp (38 g) per day.  Avoid fried foods.  Eat foods that have probiotics in them. Probiotics can be found in certain dairy products.  Avoid foods and beverages that may increase the speed at which food moves through the stomach and intestines (gastrointestinal tract). Things to avoid include:  High-fiber foods, such as dried fruit, raw fruits and vegetables, nuts, seeds, and whole grain foods.  Spicy foods and high-fat foods.  Foods and beverages sweetened with high-fructose corn syrup, honey, or sugar alcohols such as xylitol, sorbitol, and mannitol. What foods are recommended? Grains  White rice. White, [place], or pita breads (fresh or toasted), including plain rolls, buns, or bagels. White pasta. Saltine, soda, or graham crackers. Pretzels. Low-fiber cereal. Cooked cereals made with water (such as cornmeal, farina, or cream cereals). Plain muffins. Matzo. Melba toast. Zwieback. Vegetables  Potatoes (without the skin). Strained tomato and vegetable juices. Most well-cooked and canned vegetables without seeds. Tender lettuce. Fruits  Cooked or canned applesauce, apricots, cherries, fruit cocktail, grapefruit, peaches,  pears, or plums. Fresh bananas, apples without skin, cherries, grapes, cantaloupe, grapefruit, peaches, oranges, or plums. Meat and Other Protein Products  Baked or boiled chicken. Eggs. Tofu. Fish. Seafood. Smooth peanut butter. Ground or well-cooked tender beef, ham, veal, lamb, pork, or poultry. Dairy  Plain yogurt, kefir, and unsweetened liquid yogurt. Lactose-free milk, buttermilk, or soy milk. Plain hard cheese. Beverages  Sport drinks. Clear broths. Diluted fruit juices (except prune). Regular, caffeine-free sodas such as ginger ale. Water. Decaffeinated teas. Oral rehydration solutions. Sugar-free beverages not sweetened with sugar alcohols. Other  Bouillon,  broth, or soups made from recommended foods. The items listed above may not be a complete list of recommended foods or beverages. Contact your dietitian for more options.  What foods are not recommended? Grains  Whole grain, whole wheat, bran, or rye breads, rolls, pastas, crackers, and cereals. Wild or brown rice. Cereals that contain more than 2 g of fiber per serving. Corn tortillas or taco shells. Cooked or dry oatmeal. Granola. Popcorn. Vegetables  Raw vegetables. Cabbage, broccoli, Brussels sprouts, artichokes, baked beans, beet greens, corn, kale, legumes, peas, sweet potatoes, and yams. Potato skins. Cooked spinach and cabbage. Fruits  Dried fruit, including raisins and dates. Raw fruits. Stewed or dried prunes. Fresh apples with skin, apricots, mangoes, pears, raspberries, and strawberries. Meat and Other Protein Products  Chunky peanut butter. Nuts and seeds. Beans and lentils. Berniece Salines. Dairy  High-fat cheeses. Milk, chocolate milk, and beverages made with milk, such as milk shakes. Cream. Ice cream. Sweets and Desserts  Sweet rolls, doughnuts, and sweet breads. Pancakes and waffles. Fats and Oils  Butter. Cream sauces. Margarine. Salad oils. Plain salad dressings. Olives. Avocados. Beverages  Caffeinated beverages (such as coffee, tea, soda, or energy drinks). Alcoholic beverages. Fruit juices with pulp. Prune juice. Soft drinks sweetened with high-fructose corn syrup or sugar alcohols. Other  Coconut. Hot sauce. Chili powder. Mayonnaise. Gravy. Cream-based or milk-based soups. The items listed above may not be a complete list of foods and beverages to avoid. Contact your dietitian for more information.  What should I do if I become dehydrated? Diarrhea can sometimes lead to dehydration. Signs of dehydration include dark urine and dry mouth and skin. If you think you are dehydrated, you should rehydrate with an oral rehydration solution. These solutions can be purchased at pharmacies,  retail stores, or online. Drink -1 cup (120-240 mL) of oral rehydration solution each time you have an episode of diarrhea. If drinking this amount makes your diarrhea worse, try drinking smaller amounts more often. For example, drink 1-3 tsp (5-15 mL) every 5-10 minutes. A general rule for staying hydrated is to drink 1-2 L of fluid per day. Talk to your health care provider about the specific amount you should be drinking each day. Drink enough fluids to keep your urine clear or pale yellow. This information is not intended to replace advice given to you by your health care provider. Make sure you discuss any questions you have with your health care provider. Document Released: 08/24/2003 Document Revised: 11/09/2015 Document Reviewed: 04/26/2013 Elsevier Interactive Patient Education  2017 [employer].

## 2016-08-06 NOTE — Progress Notes (Signed)
Diana Blanchard  MRN: [id number] DOB: 07/01/1945  Subjective:  HPI  The patient is a 22 year oild female who presents with about 24-30 hours of nausea, vomiting, diarrhea and fever.  She has only has a couple of sips of fluid today and has kept that in but has not attempted anything more than that.  Patient started with vomiting and within minutes had diarrhea.    She checked her temperature yesterday and it was about 99. She states she was fine over the weekend but was with a large crowd of people.  She does not have anyone else in the household right now that is sick.  Patient Active Problem List   Diagnosis Date Noted  . Blood in the urine 05/31/2015  . Herpes simplex type 2 infection 05/31/2015  . Hypercholesterolemia 05/31/2015  . High potassium 05/31/2015  . Billowing mitral valve 05/31/2015  . Rotator cuff syndrome 05/31/2015  . Encounter for screening colonoscopy 03/03/2015  . Acid reflux 09/08/2009  . Clinical depression 12/08/2008  . LBP (low back pain) 12/08/2008  . Anxiety 11/07/2008  . Headache, migraine 11/07/2008    Past Medical History:  Diagnosis Date  . Anxiety   . Billowing mitral valve   . Depression   . GERD (gastroesophageal reflux disease)   . Headache   . Hypercholesterolemia     Social History   Social History  . Marital status: Married    Spouse name: Diana Blanchard  . Number of children: 1  . Years of education: business college   Occupational History  . Rertired    Social History Main Topics  . Smoking status: Former Smoker    Quit date: 06/16/1969  . Smokeless tobacco: Never Used  . Alcohol use 1.8 oz/week    3 Glasses of wine per week  . Drug use: No  . Sexual activity: Not on file   Other Topics Concern  . Not on file   Social History Narrative  . No narrative on file    Outpatient Encounter Prescriptions as of 08/06/2016  Medication Sig Note  . Ascorbic Acid (VITAMIN C) 500 MG CAPS Take 1 capsule by mouth daily. 05/31/2015:  Received from: [place]: Take by mouth.  Marland Kitchen aspirin 81 MG tablet Take 1 tablet (81 mg total) by mouth daily.   . Cholecalciferol (VITAMIN D PO) Take 1 tablet by mouth daily. 05/31/2015: Received from: [place]:   . escitalopram (LEXAPRO) 20 MG tablet Take 1 tablet (20 mg total) by mouth daily.   . simvastatin (ZOCOR) 20 MG tablet Take 1 tablet (20 mg total) by mouth daily.   . valACYclovir (VALTREX) 500 MG tablet Take 1 tablet (500 mg total) by mouth 2 (two) times daily. As needed   . promethazine (PHENERGAN) 25 MG tablet Take 1 tablet (25 mg total) by mouth every 8 (eight) hours as needed for nausea or vomiting.    No facility-administered encounter medications on file as of 08/06/2016.     Allergies  Allergen Reactions  . Latex Itching  . Neosporin [Neomycin-Bacitracin Zn-Polymyx]   . Sulfa Antibiotics Other (See Comments)    Review of Systems  Constitutional: Positive for chills, diaphoresis, fever and malaise/fatigue.  HENT: Negative for congestion, sinus pain and sore throat.   Eyes: Positive for discharge. Negative for photophobia, pain and redness.  Respiratory: Negative for cough, shortness of breath and wheezing.   Cardiovascular: Positive for palpitations. Negative for chest pain and orthopnea.  Gastrointestinal: Positive for  diarrhea, nausea and vomiting. Negative for abdominal pain (stomach rumbling with diarrhea), blood in stool, constipation, heartburn and melena.  Genitourinary: Negative for dysuria, flank pain, frequency, hematuria and urgency.  Musculoskeletal: Negative for myalgias.  Neurological: Positive for weakness.    Objective:  BP 102/60 (BP Location: Right Arm, Patient Position: Sitting, Cuff Size: Normal)   Pulse 68   Temp 98.5 F (36.9 C) (Oral)   Resp 16   Wt 143 lb (64.9 kg)   BMI 23.08 kg/m   Physical Exam  Constitutional: She is well-developed, well-nourished, and in no distress.  No distress.  HENT:  Head: Normocephalic and atraumatic.  Right Ear: Hearing, tympanic membrane, external ear and ear canal normal.  Left Ear: Hearing, tympanic membrane, external ear and ear canal normal.  Nose: Nose normal.  Mouth/Throat: Uvula is midline, oropharynx is clear and moist and mucous membranes are normal. No oropharyngeal exudate.  Eyes: Conjunctivae are normal. Pupils are equal, round, and reactive to light. Right eye exhibits no discharge. Left eye exhibits no discharge.  Neck: Normal range of motion. Neck supple. No tracheal deviation present. No thyromegaly present.  Cardiovascular: Normal rate, regular rhythm and normal heart sounds.  Exam reveals no gallop and no friction rub.   No murmur heard. Pulmonary/Chest: Effort normal and breath sounds normal. No respiratory distress. She has no wheezes. She has no rales.  Abdominal: Soft. Bowel sounds are normal. She exhibits no distension and no mass. There is no hepatosplenomegaly. There is generalized tenderness. There is no rebound, no guarding and no CVA tenderness.  Lymphadenopathy:    She has no cervical adenopathy.  Skin: She is not diaphoretic.  Vitals reviewed.   Assessment and Plan :  Viral gastroenteritis - Plan: promethazine (PHENERGAN) 25 MG tablet Suspect viral gastroenteritis. Offered promethazine IM but patient declined. Promethazine oral tablet sent in for patient. Advised to use immodium for diarrhea. Push fluids. Bland diet and slowly increase diet as tolerated.

## 2016-08-29 ENCOUNTER — Other Ambulatory Visit: Payer: Self-pay | Admitting: Physician Assistant

## 2016-08-29 DIAGNOSIS — F419 Anxiety disorder, unspecified: Secondary | ICD-10-CM

## 2016-08-29 DIAGNOSIS — E7800 Pure hypercholesterolemia, unspecified: Secondary | ICD-10-CM

## 2016-08-29 NOTE — Telephone Encounter (Signed)
Please review-aa 

## 2016-08-29 NOTE — Telephone Encounter (Signed)
Can we see if it is [facility] in [place] or the [facility] in [place]? Thanks

## 2016-08-29 NOTE — Telephone Encounter (Signed)
Pt needs refill on   simvastatin (ZOCOR) 20 MG tablet  escitalopram (LEXAPRO) 20 MG tablet  [facility] [place] told her we need to call them  Thanks teri

## 2016-08-30 MED ORDER — ESCITALOPRAM OXALATE 20 MG PO TABS: 20.0000 mg | ORAL_TABLET | Freq: Every day | ORAL | 1 refills | Status: DC

## 2016-08-30 MED ORDER — SIMVASTATIN 20 MG PO TABS: 20.0000 mg | ORAL_TABLET | Freq: Every day | ORAL | 1 refills | Status: DC

## 2016-08-30 NOTE — Telephone Encounter (Signed)
[facility] in [place]

## 2016-09-12 DIAGNOSIS — Z1283 Encounter for screening for malignant neoplasm of skin: Secondary | ICD-10-CM | POA: Diagnosis not present

## 2016-09-12 DIAGNOSIS — D485 Neoplasm of uncertain behavior of skin: Secondary | ICD-10-CM | POA: Diagnosis not present

## 2016-09-12 DIAGNOSIS — Z85828 Personal history of other malignant neoplasm of skin: Secondary | ICD-10-CM | POA: Diagnosis not present

## 2016-09-12 DIAGNOSIS — L820 Inflamed seborrheic keratosis: Secondary | ICD-10-CM | POA: Diagnosis not present

## 2016-10-21 ENCOUNTER — Other Ambulatory Visit: Payer: Self-pay | Admitting: Physician Assistant

## 2016-10-21 DIAGNOSIS — B009 Herpesviral infection, unspecified: Secondary | ICD-10-CM

## 2016-10-21 MED ORDER — VALACYCLOVIR HCL 500 MG PO TABS: 500.0000 mg | ORAL_TABLET | Freq: Two times a day (BID) | ORAL | 3 refills | Status: DC

## 2016-10-21 NOTE — Telephone Encounter (Signed)
[facility] faxed a request for the following medication. Thanks CC  valACYclovir (VALTREX) 500 MG tablet  Take 1 tablet by mouth twice daily.

## 2017-02-20 ENCOUNTER — Other Ambulatory Visit: Payer: Self-pay | Admitting: Physician Assistant

## 2017-02-20 DIAGNOSIS — E7800 Pure hypercholesterolemia, unspecified: Secondary | ICD-10-CM

## 2017-02-20 DIAGNOSIS — F419 Anxiety disorder, unspecified: Secondary | ICD-10-CM

## 2017-03-06 DIAGNOSIS — D485 Neoplasm of uncertain behavior of skin: Secondary | ICD-10-CM | POA: Diagnosis not present

## 2017-03-06 DIAGNOSIS — Z85828 Personal history of other malignant neoplasm of skin: Secondary | ICD-10-CM | POA: Diagnosis not present

## 2017-03-06 DIAGNOSIS — L820 Inflamed seborrheic keratosis: Secondary | ICD-10-CM | POA: Diagnosis not present

## 2017-03-06 DIAGNOSIS — L578 Other skin changes due to chronic exposure to nonionizing radiation: Secondary | ICD-10-CM | POA: Diagnosis not present

## 2017-03-26 ENCOUNTER — Ambulatory Visit (INDEPENDENT_AMBULATORY_CARE_PROVIDER_SITE_OTHER): Payer: Medicare Other | Admitting: Physician Assistant

## 2017-03-26 DIAGNOSIS — Z23 Encounter for immunization: Secondary | ICD-10-CM

## 2017-03-26 NOTE — Progress Notes (Signed)
Flu vaccine given today without complication. Patient sat upright for 15 minutes to check for adverse reaction before being released. °

## 2017-06-20 ENCOUNTER — Other Ambulatory Visit: Payer: Self-pay | Admitting: Physician Assistant

## 2017-06-20 DIAGNOSIS — Z1231 Encounter for screening mammogram for malignant neoplasm of breast: Secondary | ICD-10-CM

## 2017-06-23 ENCOUNTER — Ambulatory Visit (INDEPENDENT_AMBULATORY_CARE_PROVIDER_SITE_OTHER): Payer: Medicare Other | Admitting: Physician Assistant

## 2017-06-23 ENCOUNTER — Encounter: Payer: Self-pay | Admitting: Physician Assistant

## 2017-06-23 ENCOUNTER — Other Ambulatory Visit: Payer: Self-pay

## 2017-06-23 VITALS — BP 108/70 | HR 60 | Temp 98.4°F | Resp 16 | Ht 66.0 in | Wt 144.6 lb

## 2017-06-23 DIAGNOSIS — Z780 Asymptomatic menopausal state: Secondary | ICD-10-CM | POA: Diagnosis not present

## 2017-06-23 DIAGNOSIS — E875 Hyperkalemia: Secondary | ICD-10-CM

## 2017-06-23 DIAGNOSIS — Z0000 Encounter for general adult medical examination without abnormal findings: Secondary | ICD-10-CM | POA: Diagnosis not present

## 2017-06-23 DIAGNOSIS — E7800 Pure hypercholesterolemia, unspecified: Secondary | ICD-10-CM

## 2017-06-23 DIAGNOSIS — Z1159 Encounter for screening for other viral diseases: Secondary | ICD-10-CM | POA: Diagnosis not present

## 2017-06-23 DIAGNOSIS — Z1239 Encounter for other screening for malignant neoplasm of breast: Secondary | ICD-10-CM

## 2017-06-23 NOTE — Progress Notes (Signed)
Patient: Diana Blanchard, Female    DOB: Jul 10, 1945, 72 y.o.   MRN: [id number] Visit Date: 06/23/2017  Today's Provider: Mar Daring, PA-C   Chief Complaint  Patient presents with  . Medicare Wellness   Subjective:    Annual wellness visit Diana Blanchard is a 72 y.o. female. She feels well. She reports exercising some. She reports she is sleeping well.  Last AWE:06/07/2016 -----------------------------------------------------------   Review of Systems  Constitutional: Negative.   HENT: Positive for ear pain.   Eyes: Negative.   Respiratory: Negative.   Cardiovascular: Positive for palpitations.  Gastrointestinal: Negative.   Endocrine: Negative.   Genitourinary: Negative.   Musculoskeletal: Positive for back pain and neck stiffness.  Skin: Negative.   Allergic/Immunologic: Negative.   Neurological: Positive for numbness.  Hematological: Negative.   Psychiatric/Behavioral: Negative.     Social History   Socioeconomic History  . Marital status: Married    Spouse name: Merry Proud  . Number of children: 1  . Years of education: business college  . Highest education level: Not on file  Social Needs  . Financial resource strain: Not on file  . Food insecurity - worry: Not on file  . Food insecurity - inability: Not on file  . Transportation needs - medical: Not on file  . Transportation needs - non-medical: Not on file  Occupational History  . Occupation: Rertired  Tobacco Use  . Smoking status: Former Smoker    Last attempt to quit: 06/16/1969    Years since quitting: 48.0  . Smokeless tobacco: Never Used  Substance and Sexual Activity  . Alcohol use: Yes    Alcohol/week: 1.8 oz    Types: 3 Glasses of wine per week  . Drug use: No  . Sexual activity: Not on file  Other Topics Concern  . Not on file  Social History Narrative  . Not on file    Past Medical History:  Diagnosis Date  . Anxiety   . Billowing mitral valve   . Depression   . GERD  (gastroesophageal reflux disease)   . Headache   . Hypercholesterolemia      Patient Active Problem List   Diagnosis Date Noted  . Blood in the urine 05/31/2015  . Herpes simplex type 2 infection 05/31/2015  . Hypercholesterolemia 05/31/2015  . High potassium 05/31/2015  . Billowing mitral valve 05/31/2015  . Rotator cuff syndrome 05/31/2015  . Encounter for screening colonoscopy 03/03/2015  . Acid reflux 09/08/2009  . Clinical depression 12/08/2008  . LBP (low back pain) 12/08/2008  . Anxiety 11/07/2008  . Headache, migraine 11/07/2008    Past Surgical History:  Procedure Laterality Date  . bowel obstruction  1990  . BREAST CYST ASPIRATION Right 1980's  . COLONOSCOPY WITH PROPOFOL N/A 06/29/2015   Procedure: COLONOSCOPY WITH PROPOFOL;  Surgeon: Manya Silvas, MD;  Location: [facility] ENDOSCOPY;  Service: Endoscopy;  Laterality: N/A;  . cyst removed  1981   on the ovary  . LAPAROSCOPY  0174   complications from IUD  . ROTATOR CUFF REPAIR Right 06/2011  . TONSILLECTOMY AND ADENOIDECTOMY  1953    Her family history includes Arthritis in her mother; CVA in her father; Diabetes in her father; HIV in her brother; Hyperlipidemia in her mother; Hypertension in her mother.      Current Outpatient Medications:  .  Ascorbic Acid (VITAMIN C) 500 MG CAPS, Take 1 capsule by mouth daily., Disp: , Rfl:  .  aspirin 81 MG tablet,  Take 1 tablet (81 mg total) by mouth daily., Disp: 1 tablet, Rfl: 0 .  Cholecalciferol (VITAMIN D PO), Take 1 tablet by mouth daily., Disp: , Rfl:  .  escitalopram (LEXAPRO) 20 MG tablet, TAKE 1 TABLET BY MOUTH DAILY, Disp: 90 tablet, Rfl: 1 .  simvastatin (ZOCOR) 20 MG tablet, TAKE 1 TABLET BY MOUTH DAILY, Disp: 90 tablet, Rfl: 1 .  valACYclovir (VALTREX) 500 MG tablet, Take 1 tablet (500 mg total) by mouth 2 (two) times daily. As needed, Disp: 30 tablet, Rfl: 3  Patient Care Team: Mar Daring, PA-C as PCP - General (Family Medicine)     Objective:    Vitals: BP 108/70 (BP Location: Left Arm, Patient Position: Sitting, Cuff Size: Normal)   Pulse 60   Temp 98.4 F (36.9 C) (Oral)   Resp 16   Ht 5\' 6"  (1.676 m)   Wt 144 lb 9.6 oz (65.6 kg)   BMI 23.34 kg/m   Physical Exam  Constitutional: She is oriented to person, place, and time. She appears well-developed and well-nourished. No distress.  HENT:  Head: Normocephalic and atraumatic.  Right Ear: Hearing, tympanic membrane, external ear and ear canal normal.  Left Ear: Hearing, tympanic membrane, external ear and ear canal normal.  Nose: Nose normal.  Mouth/Throat: Uvula is midline, oropharynx is clear and moist and mucous membranes are normal. No oropharyngeal exudate.  Eyes: Conjunctivae and EOM are normal. Pupils are equal, round, and reactive to light. Right eye exhibits no discharge. Left eye exhibits no discharge. No scleral icterus.  Neck: Normal range of motion. Neck supple. No JVD present. Carotid bruit is not present. No tracheal deviation present. No thyromegaly present.  Cardiovascular: Normal rate, regular rhythm, normal heart sounds and intact distal pulses. Exam reveals no gallop and no friction rub.  No murmur heard. Pulmonary/Chest: Effort normal and breath sounds normal. No respiratory distress. She has no wheezes. She has no rales. She exhibits no tenderness.  Abdominal: Soft. Bowel sounds are normal. She exhibits no distension and no mass. There is no tenderness. There is no rebound and no guarding.  Musculoskeletal: Normal range of motion. She exhibits no edema or tenderness.  Lymphadenopathy:    She has no cervical adenopathy.  Neurological: She is alert and oriented to person, place, and time. She has normal reflexes.  Skin: Skin is warm and dry. No rash noted. She is not diaphoretic.  Psychiatric: She has a normal mood and affect. Her behavior is normal. Judgment and thought content normal.  Vitals reviewed.   Activities of Daily Living In your present  state of health, do you have any difficulty performing the following activities: 06/23/2017  Hearing? N  Vision? N  Difficulty concentrating or making decisions? N  Walking or climbing stairs? N  Dressing or bathing? N  Doing errands, shopping? N  Some recent data might be hidden    Fall Risk Assessment Fall Risk  06/23/2017 06/07/2016 06/02/2015  Falls in the past year? No No No     Depression Screen PHQ 2/9 Scores 06/23/2017 06/07/2016 06/02/2015  PHQ - 2 Score 1 1 3   PHQ- 9 Score - - 3    Cognitive Testing - 6-CIT  Correct? Score   What year is it? yes 0 0 or 4  What month is it? yes 0 0 or 3  Memorize:    Pia Mau,  43,  [place],      What time is it? (within 1 hour) yes  0 0 or 3  Count backwards from 20 yes 0 0, 2, or 4  Name the months of the year yes 0 0, 2, or 4  Repeat name & address above yes 0 0, 2, 4, 6, 8, or 10       TOTAL SCORE  0/28   Interpretation:  Normal  Normal (0-7) Abnormal (8-28)       Assessment & Plan:     Annual Wellness Visit  Reviewed patient's Family Medical History Reviewed and updated list of patient's medical providers Assessment of cognitive impairment was done Assessed patient's functional ability Established a written schedule for health screening [place] Completed and Reviewed  Exercise Activities and Dietary recommendations Goals    . Exercise 150 minutes per week (moderate activity)       Immunization History  Administered Date(s) Administered  . Influenza, High Dose Seasonal PF 04/05/2015, 04/11/2016, 03/26/2017  . Pneumococcal Conjugate-13 06/03/2014  . Pneumococcal Polysaccharide-23 04/07/2013  . Tdap 04/09/2005, 06/02/2015  . Zoster 01/13/2008    Health Maintenance  Topic Date Due  . Hepatitis C Screening  1945/11/26  . MAMMOGRAM  06/25/2018  . TETANUS/TDAP  06/01/2025  . COLONOSCOPY  06/28/2025  . INFLUENZA VACCINE  Completed  . DEXA SCAN  Completed  . PNA vac Low Risk  Adult  Completed     Discussed health benefits of physical activity, and encouraged her to engage in regular exercise appropriate for her age and condition.    1. Medicare annual wellness visit, subsequent Normal physical exam today. Up to date on all vaccinations.  2. Breast cancer screening There is no family history of breast cancer. She does perform regular self breast exams. Mammogram was ordered as below. Information for [facility] was given to patient so she may schedule her mammogram at her convenience. - MM Digital Screening; Future  3. Hypercholesterolemia Stable on simvastatin 20mg . Will check labs as below and f/u pending results. - CBC w/Diff/Platelet - Comprehensive Metabolic Panel (CMET) - Lipid Profile  4. High potassium H/O this. Will check labs as below and f/u pending results. - CBC w/Diff/Platelet - Comprehensive Metabolic Panel (CMET)  5. Postmenopausal estrogen deficiency Due for bone density. Last done in 2017. Osteopenic. - DG Bone Density; Future  6. Need for hepatitis C screening test - Hepatitis C Antibody  ------------------------------------------------------------------------------------------------------------    Mar Daring, PA-C  [facility]

## 2017-06-23 NOTE — Patient Instructions (Signed)

## 2017-06-25 ENCOUNTER — Encounter: Payer: Self-pay | Admitting: Physician Assistant

## 2017-06-27 DIAGNOSIS — E875 Hyperkalemia: Secondary | ICD-10-CM | POA: Diagnosis not present

## 2017-06-27 DIAGNOSIS — E7800 Pure hypercholesterolemia, unspecified: Secondary | ICD-10-CM | POA: Diagnosis not present

## 2017-06-27 DIAGNOSIS — Z1159 Encounter for screening for other viral diseases: Secondary | ICD-10-CM | POA: Diagnosis not present

## 2017-06-28 LAB — LIPID PANEL
CHOLESTEROL TOTAL: 203 mg/dL — AB (ref 100–199)
Chol/HDL Ratio: 2.4 ratio (ref 0.0–4.4)
HDL: 83 mg/dL (ref >39)
LDL CALC: 105 mg/dL — AB (ref 0–99)
Triglycerides: 77 mg/dL (ref 0–149)
VLDL Cholesterol Cal: 15 mg/dL (ref 5–40)

## 2017-06-28 LAB — CBC WITH DIFFERENTIAL/PLATELET
BASOS ABS: 0 10*3/uL (ref 0.0–0.2)
Basos: 1 %
EOS (ABSOLUTE): 0.2 10*3/uL (ref 0.0–0.4)
Eos: 4 %
HEMATOCRIT: 40.2 % (ref 34.0–46.6)
Hemoglobin: 13.6 g/dL (ref 11.1–15.9)
Immature Grans (Abs): 0 10*3/uL (ref 0.0–0.1)
Immature Granulocytes: 0 %
LYMPHS ABS: 1.6 10*3/uL (ref 0.7–3.1)
Lymphs: 37 %
MCH: 32 pg (ref 26.6–33.0)
MCHC: 33.8 g/dL (ref 31.5–35.7)
MCV: 95 fL (ref 79–97)
Monocytes Absolute: 0.4 10*3/uL (ref 0.1–0.9)
Monocytes: 10 %
NEUTROS ABS: 2 10*3/uL (ref 1.4–7.0)
Neutrophils: 48 %
Platelets: 277 10*3/uL (ref 150–379)
RBC: 4.25 x10E6/uL (ref 3.77–5.28)
RDW: 13.1 % (ref 12.3–15.4)
WBC: 4.2 10*3/uL (ref 3.4–10.8)

## 2017-06-28 LAB — COMPREHENSIVE METABOLIC PANEL
ALBUMIN: 4.7 g/dL (ref 3.5–4.8)
ALK PHOS: 75 IU/L (ref 39–117)
ALT: 9 IU/L (ref 0–32)
AST: 18 IU/L (ref 0–40)
Albumin/Globulin Ratio: 2 (ref 1.2–2.2)
BILIRUBIN TOTAL: 0.6 mg/dL (ref 0.0–1.2)
BUN / CREAT RATIO: 15 (ref 12–28)
BUN: 14 mg/dL (ref 8–27)
CHLORIDE: 104 mmol/L (ref 96–106)
CO2: 25 mmol/L (ref 20–29)
CREATININE: 0.95 mg/dL (ref 0.57–1.00)
Calcium: 9.7 mg/dL (ref 8.7–10.3)
GFR calc Af Amer: 70 mL/min/{1.73_m2} (ref >59)
GFR calc non Af Amer: 61 mL/min/{1.73_m2} (ref >59)
GLOBULIN, TOTAL: 2.3 g/dL (ref 1.5–4.5)
GLUCOSE: 88 mg/dL (ref 65–99)
POTASSIUM: 4.4 mmol/L (ref 3.5–5.2)
SODIUM: 144 mmol/L (ref 134–144)
Total Protein: 7 g/dL (ref 6.0–8.5)

## 2017-06-28 LAB — HEPATITIS C ANTIBODY: Hep C Virus Ab: 0.1 s/co ratio (ref 0.0–0.9)

## 2017-07-01 ENCOUNTER — Telehealth: Payer: Self-pay

## 2017-07-01 NOTE — Telephone Encounter (Signed)
-----   Message from Mar Daring, [place] sent at 07/01/2017 11:26 AM EST ----- All labs are within normal limits and stable.  Thanks! -JB

## 2017-07-01 NOTE — Telephone Encounter (Signed)
Patient advised as directed below.  Thanks,  -Sharunda Salmon 

## 2017-07-04 ENCOUNTER — Ambulatory Visit
Admission: RE | Admit: 2017-07-04 | Discharge: 2017-07-04 | Disposition: A | Discharge status: Home / Self Care | Payer: Medicare Other | Source: Ambulatory Visit | Attending: Physician Assistant | Admitting: Physician Assistant

## 2017-07-04 DIAGNOSIS — Z1231 Encounter for screening mammogram for malignant neoplasm of breast: Secondary | ICD-10-CM

## 2017-07-22 ENCOUNTER — Ambulatory Visit
Admission: RE | Admit: 2017-07-22 | Discharge: 2017-07-22 | Disposition: A | Discharge status: Home / Self Care | Payer: Medicare Other | Source: Ambulatory Visit | Attending: Physician Assistant | Admitting: Physician Assistant

## 2017-07-22 DIAGNOSIS — Z780 Asymptomatic menopausal state: Secondary | ICD-10-CM | POA: Insufficient documentation

## 2017-07-22 DIAGNOSIS — M8589 Other specified disorders of bone density and structure, multiple sites: Secondary | ICD-10-CM | POA: Diagnosis not present

## 2017-07-22 DIAGNOSIS — M8588 Other specified disorders of bone density and structure, other site: Secondary | ICD-10-CM | POA: Insufficient documentation

## 2017-07-22 DIAGNOSIS — M85851 Other specified disorders of bone density and structure, right thigh: Secondary | ICD-10-CM | POA: Insufficient documentation

## 2017-07-24 ENCOUNTER — Telehealth: Payer: Self-pay

## 2017-07-24 NOTE — Telephone Encounter (Signed)
Patient advised as below. Patient reports she will continue OTC treatment. sd

## 2017-07-24 NOTE — Telephone Encounter (Signed)
-----   Message from Mar Daring, [place] sent at 07/22/2017  8:23 PM EST ----- Bone density shows osteopenia still but risk for hip fracture and risk for "major" fracture have both increased when compared to bone density from 2017, thus bone density has worsened slightly over the last 2 years. Definitely continue Vit D and calcium supplementation with weight bearing exercises such as walking. If interested in other treatment options please schedule an appointment to discuss treatment options.

## 2017-08-28 ENCOUNTER — Other Ambulatory Visit: Payer: Self-pay | Admitting: Physician Assistant

## 2017-08-28 DIAGNOSIS — F419 Anxiety disorder, unspecified: Secondary | ICD-10-CM

## 2017-08-28 DIAGNOSIS — E7800 Pure hypercholesterolemia, unspecified: Secondary | ICD-10-CM

## 2017-09-17 DIAGNOSIS — D225 Melanocytic nevi of trunk: Secondary | ICD-10-CM | POA: Diagnosis not present

## 2017-09-17 DIAGNOSIS — D485 Neoplasm of uncertain behavior of skin: Secondary | ICD-10-CM | POA: Diagnosis not present

## 2017-09-17 DIAGNOSIS — D2230 Melanocytic nevi of unspecified part of face: Secondary | ICD-10-CM | POA: Diagnosis not present

## 2017-09-17 DIAGNOSIS — Z85828 Personal history of other malignant neoplasm of skin: Secondary | ICD-10-CM | POA: Diagnosis not present

## 2017-12-01 ENCOUNTER — Other Ambulatory Visit: Payer: Self-pay | Admitting: Physician Assistant

## 2017-12-01 DIAGNOSIS — E7800 Pure hypercholesterolemia, unspecified: Secondary | ICD-10-CM

## 2017-12-01 DIAGNOSIS — F419 Anxiety disorder, unspecified: Secondary | ICD-10-CM

## 2017-12-01 MED ORDER — SIMVASTATIN 20 MG PO TABS: 20.0000 mg | ORAL_TABLET | Freq: Every day | ORAL | 1 refills | Status: DC

## 2017-12-01 MED ORDER — ESCITALOPRAM OXALATE 20 MG PO TABS: 20.0000 mg | ORAL_TABLET | Freq: Every day | ORAL | 1 refills | Status: DC

## 2017-12-01 NOTE — Telephone Encounter (Signed)
Pt contacted office for refill request on the following medications:  1. escitalopram (LEXAPRO) 20 MG tablet  2. simvastatin (ZOCOR) 20 MG tablet  [facility]  Pt stated that her insurance is requiring her to use [facility] and needs her medications switched over. Please advise. Thanks TNP

## 2018-02-06 ENCOUNTER — Other Ambulatory Visit: Payer: Self-pay | Admitting: Physician Assistant

## 2018-02-06 DIAGNOSIS — B009 Herpesviral infection, unspecified: Secondary | ICD-10-CM

## 2018-02-06 MED ORDER — VALACYCLOVIR HCL 500 MG PO TABS: 500.0000 mg | ORAL_TABLET | Freq: Two times a day (BID) | ORAL | 3 refills | Status: DC

## 2018-02-06 NOTE — Telephone Encounter (Signed)
Pt contacted office for refill request on the following medications:  valACYclovir (VALTREX) 500 MG tablet  [facility]  Last Rx: 10/21/16 LOV: 06/23/17 Please advise. Thanks TNP

## 2018-03-04 ENCOUNTER — Other Ambulatory Visit: Payer: Self-pay

## 2018-03-04 DIAGNOSIS — E7800 Pure hypercholesterolemia, unspecified: Secondary | ICD-10-CM

## 2018-03-04 DIAGNOSIS — F419 Anxiety disorder, unspecified: Secondary | ICD-10-CM

## 2018-03-04 MED ORDER — ESCITALOPRAM OXALATE 20 MG PO TABS: 20.0000 mg | ORAL_TABLET | Freq: Every day | ORAL | 1 refills | Status: DC

## 2018-03-04 MED ORDER — SIMVASTATIN 20 MG PO TABS: 20.0000 mg | ORAL_TABLET | Freq: Every day | ORAL | 1 refills | Status: DC

## 2018-03-04 NOTE — Telephone Encounter (Signed)
Patient called saying that she is out of medication. She reports that she requested med 2 days ago and never got a response. Please review. Thanks!

## 2018-04-08 ENCOUNTER — Ambulatory Visit (INDEPENDENT_AMBULATORY_CARE_PROVIDER_SITE_OTHER): Payer: Medicare Other

## 2018-04-08 DIAGNOSIS — Z23 Encounter for immunization: Secondary | ICD-10-CM

## 2018-06-23 ENCOUNTER — Encounter: Payer: Self-pay | Admitting: Physician Assistant

## 2018-06-30 ENCOUNTER — Ambulatory Visit (INDEPENDENT_AMBULATORY_CARE_PROVIDER_SITE_OTHER): Payer: Medicare Other

## 2018-06-30 VITALS — BP 108/62 | HR 56 | Temp 98.8°F | Ht 66.0 in | Wt 150.4 lb

## 2018-06-30 DIAGNOSIS — Z0000 Encounter for general adult medical examination without abnormal findings: Secondary | ICD-10-CM | POA: Diagnosis not present

## 2018-06-30 NOTE — Patient Instructions (Addendum)
Diana Blanchard , Thank you for taking time to come for your Medicare Wellness Visit. I appreciate your ongoing commitment to your health goals. Please review the following plan we discussed and let me know if I can assist you in the future.   Screening recommendations/referrals: Colonoscopy: Up to date, due 06/2020 Mammogram: Up to date, due 06/2019 Bone Density: Up to date, due 07/2022 Recommended yearly ophthalmology/optometry visit for glaucoma screening and checkup Recommended yearly dental visit for hygiene and checkup  Vaccinations: Influenza vaccine: Up to date Pneumococcal vaccine: Completed series Tdap vaccine: Up to date, due 05/2025 Shingles vaccine: Completed series.    Advanced directives: Please bring a copy of your POA (Power of Attorney) and/or Living Will to your next appointment.   Conditions/risks identified: Recommend to exercise for 3 days a week for at least 30 minutes at a time.   Next appointment: 07/08/18 @ 4:00 PM with Fenton Malling.    Preventive Care 75 Years and Older, Female Preventive care refers to lifestyle choices and visits with your health care provider that can promote health and wellness. What does preventive care include?  A yearly physical exam. This is also called an annual well check.  Dental exams once or twice a year.  Routine eye exams. Ask your health care provider how often you should have your eyes checked.  Personal lifestyle choices, including:  Daily care of your teeth and gums.  Regular physical activity.  Eating a healthy diet.  Avoiding tobacco and drug use.  Limiting alcohol use.  Practicing safe sex.  Taking low-dose aspirin every day.  Taking vitamin and mineral supplements as recommended by your health care provider. What happens during an annual well check? The services and screenings done by your health care provider during your annual well check will depend on your age, overall health, lifestyle risk  factors, and family history of disease. Counseling  Your health care provider may ask you questions about your:  Alcohol use.  Tobacco use.  Drug use.  Emotional well-being.  Home and relationship well-being.  Sexual activity.  Eating habits.  History of falls.  Memory and ability to understand (cognition).  Work and work Statistician.  Reproductive health. Screening  You may have the following tests or measurements:  Height, weight, and BMI.  Blood pressure.  Lipid and cholesterol levels. These may be checked every 5 years, or more frequently if you are over 74 years old.  Skin check.  Lung cancer screening. You may have this screening every year starting at age 65 if you have a 30-pack-year history of smoking and currently smoke or have quit within the past 15 years.  Fecal occult blood test (FOBT) of the stool. You may have this test every year starting at age 19.  Flexible sigmoidoscopy or colonoscopy. You may have a sigmoidoscopy every 5 years or a colonoscopy every 10 years starting at age 72.  Hepatitis C blood test.  Hepatitis B blood test.  Sexually transmitted disease (STD) testing.  Diabetes screening. This is done by checking your blood sugar (glucose) after you have not eaten for a while (fasting). You may have this done every 1-3 years.  Bone density scan. This is done to screen for osteoporosis. You may have this done starting at age 17.  Mammogram. This may be done every 1-2 years. Talk to your health care provider about how often you should have regular mammograms. Talk with your health care provider about your test results, treatment options, and if necessary,  the need for more tests. Vaccines  Your health care provider may recommend certain vaccines, such as:  Influenza vaccine. This is recommended every year.  Tetanus, diphtheria, and acellular pertussis (Tdap, Td) vaccine. You may need a Td booster every 10 years.  Zoster vaccine. You  may need this after age 94.  Pneumococcal 13-valent conjugate (PCV13) vaccine. One dose is recommended after age 102.  Pneumococcal polysaccharide (PPSV23) vaccine. One dose is recommended after age 4. Talk to your health care provider about which screenings and vaccines you need and how often you need them. This information is not intended to replace advice given to you by your health care provider. Make sure you discuss any questions you have with your health care provider. Document Released: 06/30/2015 Document Revised: 02/21/2016 Document Reviewed: 04/04/2015 Elsevier Interactive Patient Education  2017 [place] Prevention in the Home Falls can cause injuries. They can happen to people of all ages. There are many things you can do to make your home safe and to help prevent falls. What can I do on the outside of my home?  Regularly fix the edges of walkways and driveways and fix any cracks.  Remove anything that might make you trip as you walk through a door, such as a raised step or threshold.  Trim any bushes or trees on the path to your home.  Use bright outdoor lighting.  Clear any walking paths of anything that might make someone trip, such as rocks or tools.  Regularly check to see if handrails are loose or broken. Make sure that both sides of any steps have handrails.  Any raised decks and porches should have guardrails on the edges.  Have any leaves, snow, or ice cleared regularly.  Use sand or salt on walking paths during winter.  Clean up any spills in your garage right away. This includes oil or grease spills. What can I do in the bathroom?  Use night lights.  Install grab bars by the toilet and in the tub and shower. Do not use towel bars as grab bars.  Use non-skid mats or decals in the tub or shower.  If you need to sit down in the shower, use a plastic, non-slip stool.  Keep the floor dry. Clean up any water that spills on the floor as soon as  it happens.  Remove soap buildup in the tub or shower regularly.  Attach bath mats securely with double-sided non-slip rug tape.  Do not have throw rugs and other things on the floor that can make you trip. What can I do in the bedroom?  Use night lights.  Make sure that you have a light by your bed that is easy to reach.  Do not use any sheets or blankets that are too big for your bed. They should not hang down onto the floor.  Have a firm chair that has side arms. You can use this for support while you get dressed.  Do not have throw rugs and other things on the floor that can make you trip. What can I do in the kitchen?  Clean up any spills right away.  Avoid walking on wet floors.  Keep items that you use a lot in easy-to-reach places.  If you need to reach something above you, use a strong step stool that has a grab bar.  Keep electrical cords out of the way.  Do not use floor polish or wax that makes floors slippery. If you must use  wax, use non-skid floor wax.  Do not have throw rugs and other things on the floor that can make you trip. What can I do with my stairs?  Do not leave any items on the stairs.  Make sure that there are handrails on both sides of the stairs and use them. Fix handrails that are broken or loose. Make sure that handrails are as long as the stairways.  Check any carpeting to make sure that it is firmly attached to the stairs. Fix any carpet that is loose or worn.  Avoid having throw rugs at the top or bottom of the stairs. If you do have throw rugs, attach them to the floor with carpet tape.  Make sure that you have a light switch at the top of the stairs and the bottom of the stairs. If you do not have them, ask someone to add them for you. What else can I do to help prevent falls?  Wear shoes that:  Do not have high heels.  Have rubber bottoms.  Are comfortable and fit you well.  Are closed at the toe. Do not wear sandals.  If  you use a stepladder:  Make sure that it is fully opened. Do not climb a closed stepladder.  Make sure that both sides of the stepladder are locked into place.  Ask someone to hold it for you, if possible.  Clearly mark and make sure that you can see:  Any grab bars or handrails.  First and last steps.  Where the edge of each step is.  Use tools that help you move around (mobility aids) if they are needed. These include:  Canes.  Walkers.  Scooters.  Crutches.  Turn on the lights when you go into a dark area. Replace any light bulbs as soon as they burn out.  Set up your furniture so you have a clear path. Avoid moving your furniture around.  If any of your floors are uneven, fix them.  If there are any pets around you, be aware of where they are.  Review your medicines with your doctor. Some medicines can make you feel dizzy. This can increase your chance of falling. Ask your doctor what other things that you can do to help prevent falls. This information is not intended to replace advice given to you by your health care provider. Make sure you discuss any questions you have with your health care provider. Document Released: 03/30/2009 Document Revised: 11/09/2015 Document Reviewed: 07/08/2014 Elsevier Interactive Patient Education  2017 [employer].

## 2018-06-30 NOTE — Progress Notes (Signed)
Subjective:   Diana Blanchard is a 73 y.o. female who presents for Medicare Annual (Subsequent) preventive examination.  Review of Systems:  N/A  Cardiac Risk Factors include: advanced age (>44men, >27 women);dyslipidemia     Objective:     Vitals: BP 108/62 (BP Location: Right Arm)   Pulse (!) 56   Temp 98.8 F (37.1 C) (Oral)   Ht 5\' 6"  (1.676 m)   Wt 150 lb 6.4 oz (68.2 kg)   BMI 24.28 kg/m   Body mass index is 24.28 kg/m.  Advanced Directives 06/30/2018 08/06/2016 06/07/2016 06/02/2015  Does Patient Have a Medical Advance Directive? Yes Yes Yes Yes  Type of Paramedic of [place];Living will [place];Living will [place];Living will [place];Living will  Copy of [place] in Chart? No - copy requested - - -    Tobacco Social History   Tobacco Use  Smoking Status Former Smoker  . Last attempt to quit: 06/16/1969  . Years since quitting: 49.0  Smokeless Tobacco Never Used     Counseling given: Not Answered   Clinical Intake:  Pre-visit preparation completed: Yes  Pain : No/denies pain Pain Score: 0-No pain     Nutritional Status: BMI of 19-24  Normal Nutritional Risks: None Diabetes: No  How often do you need to have someone help you when you read instructions, pamphlets, or other written materials from your doctor or pharmacy?: 1 - Never  Interpreter Needed?: No  Information entered by :: [facility], LPN  Past Medical History:  Diagnosis Date  . Anxiety   . Billowing mitral valve   . Depression   . GERD (gastroesophageal reflux disease)   . Headache   . Hypercholesterolemia    Past Surgical History:  Procedure Laterality Date  . bowel obstruction  1990  . BREAST CYST ASPIRATION Right 1980's  . COLONOSCOPY WITH PROPOFOL N/A 06/29/2015   Procedure: COLONOSCOPY WITH PROPOFOL;  Surgeon: Manya Silvas, MD;  Location: [facility] END[facility]COPY;  Service:  Endoscopy;  Laterality: N/A;  . cyst removed  1981   on the ovary  . LAPAROSCOPY  1027   complications from IUD  . ROTATOR CUFF REPAIR Right 06/2011  . TONSILLECTOMY AND ADENOIDECTOMY  1953   Family History  Problem Relation Age of Onset  . Arthritis Mother   . Hyperlipidemia Mother   . Hypertension Mother   . Alzheimer's disease Mother   . Diabetes Father   . CVA Father   . HIV Brother   . Breast cancer Neg Hx    Social History   Socioeconomic History  . Marital status: Married    Spouse name: Merry Proud  . Number of children: 1  . Years of education: business college  . Highest education level: Associate degree: occupational, Hotel manager, or vocational program  Occupational History  . Occupation: Rertired  Scientific laboratory technician  . Financial resource strain: Not hard at all  . Food insecurity:    Worry: Never true    Inability: Never true  . Transportation needs:    Medical: No    Non-medical: No  Tobacco Use  . Smoking status: Former Smoker    Last attempt to quit: 06/16/1969    Years since quitting: 49.0  . Smokeless tobacco: Never Used  Substance and Sexual Activity  . Alcohol use: Yes    Alcohol/week: 0.0 - 4.0 standard drinks  . Drug use: No  . Sexual activity: Not on file  Lifestyle  .  Physical activity:    Days per week: 0 days    Minutes per session: 0 min  . Stress: Rather much  Relationships  . Social connections:    Talks on phone: Patient refused    Gets together: Patient refused    Attends religious service: Patient refused    Active member of club or organization: Patient refused    Attends meetings of clubs or organizations: Patient refused    Relationship status: Patient refused  Other Topics Concern  . Not on file  Social History Narrative  . Not on file    Outpatient Encounter Medications as of 06/30/2018  Medication Sig  . Ascorbic Acid (VITAMIN C) 500 MG CAPS Take 1 capsule by mouth daily.  . Cholecalciferol (VITAMIN D PO) Take 1 tablet by mouth  daily.  Marland Kitchen escitalopram (LEXAPRO) 20 MG tablet Take 1 tablet (20 mg total) by mouth daily.  . simvastatin (ZOCOR) 20 MG tablet Take 1 tablet (20 mg total) by mouth daily.  . valACYclovir (VALTREX) 500 MG tablet Take 1 tablet (500 mg total) by mouth 2 (two) times daily. As needed  . aspirin 81 MG tablet Take 1 tablet (81 mg total) by mouth daily. (Patient not taking: Reported on 06/30/2018)   No facility-administered encounter medications on file as of 06/30/2018.     Activities of Daily Living In your present state of health, do you have any difficulty performing the following activities: 06/30/2018  Hearing? N  Vision? N  Comment Wears eyeglasses.   Difficulty concentrating or making decisions? N  Walking or climbing stairs? N  Dressing or bathing? N  Doing errands, shopping? N  Preparing Food and eating ? N  Using the Toilet? N  In the past six months, have you accidently leaked urine? N  Do you have problems with loss of bowel control? N  Managing your Medications? N  Managing your Finances? N  Housekeeping or managing your Housekeeping? N  Some recent data might be hidden    Patient Care Team: Rubye Beach as PCP - General (Family Medicine) Ralene Bathe, MD (Dermatology) Arelia Sneddon, OD (Optometry)    Assessment:   This is a routine wellness examination for Diana Blanchard.  Exercise Activities and Dietary recommendations Current Exercise Habits: The patient does not participate in regular exercise at present, Exercise limited by: None identified  Goals    . Exercise 150 minutes per week (moderate activity)    . Exercise 3x per week (30 min per time)     Recommend to exercise for 3 days a week for at least 30 minutes at a time.        Fall Risk Fall Risk  06/30/2018 06/23/2017 06/07/2016 06/02/2015  Falls in the past year? 0 No No No   FALL RISK PREVENTION PERTAINING TO THE HOME:  Any stairs in or around the home WITH handrails? Yes  Home free of loose  throw rugs in walkways, pet beds, electrical cords, etc? Yes  Adequate lighting in your home to reduce risk of falls? Yes   ASSISTIVE DEVICES UTILIZED TO PREVENT FALLS:  Life alert? No  Use of a cane, walker or w/c? No  Grab bars in the bathroom? Yes Shower chair or bench in shower? No  Elevated toilet seat or a handicapped toilet? Yes    TIMED UP AND GO:  Was the test performed? No .     Depression Screen PHQ 2/9 Scores 06/30/2018 06/23/2017 06/07/2016 06/02/2015  PHQ - 2 Score 2  1 1 3   PHQ- 9 Score 2 - - 3     Cognitive Function     6CIT Screen 06/30/2018  What Year? 0 points  What month? 0 points  What time? 0 points  Count back from 20 0 points  Months in reverse 0 points  Repeat phrase 0 points  Total Score 0    Immunization History  Administered Date(s) Administered  . Influenza Split 04/03/2009, 03/01/2010, 03/08/2011, 04/06/2012  . Influenza, High Dose Seasonal PF 04/19/2014, 04/05/2015, 04/11/2016, 03/26/2017, 04/08/2018  . Influenza,inj,Quad PF,6+ Mos 04/07/2013  . Pneumococcal Conjugate-13 06/03/2014  . Pneumococcal Polysaccharide-23 06/17/1998, 04/07/2013  . Tdap 04/09/2005, 06/02/2015  . Zoster 01/13/2008  . Zoster Recombinat (Shingrix) 12/02/2017, 04/07/2018    Qualifies for Shingles Vaccine? Up to date  Tdap: Up to date  Flu Vaccine: Up to date  Pneumococcal Vaccine: Up to date   Screening Tests Health Maintenance  Topic Date Due  . MAMMOGRAM  07/05/2019  . COLONOSCOPY  06/28/2020  . DEXA SCAN  07/22/2022  . TETANUS/TDAP  06/01/2025  . INFLUENZA VACCINE  Completed  . Hepatitis C Screening  Completed  . PNA vac Low Risk Adult  Completed    Cancer Screenings:  Colorectal Screening: Completed 06/29/15. Repeat every 5 years.  Mammogram: Completed 07/04/17.    Bone Density: Completed 07/22/14. Results reflect OSTEOPENIA. Repeat every 5 years.   Lung Cancer Screening: (Low Dose CT Chest recommended if Age 87-80 years, 30 pack-year  currently smoking OR have quit w/in 15years.) does not qualify.    Additional Screening:  Hepatitis C Screening: Up to date  Vision Screening: Recommended annual ophthalmology exams for early detection of glaucoma and other disorders of the eye.  Dental Screening: Recommended annual dental exams for proper oral hygiene  Community Resource Referral:  CRR required this visit?  No       Plan:  I have personally reviewed and addressed the Medicare Annual Wellness questionnaire and have noted the following in the patient's chart:  A. Medical and social history B. Use of alcohol, tobacco or illicit drugs  C. Current medications and supplements D. Functional ability and status E.  Nutritional status F.  Physical activity G. Advance directives H. List of other physicians I.  Hospitalizations, surgeries, and ER visits in previous 12 months J.  [place] such as hearing and vision if needed, cognitive and depression L. Referrals and appointments - none  In addition, I have reviewed and discussed with patient certain preventive protocols, quality metrics, and best practice recommendations. A written personalized care plan for preventive services as well as general preventive health recommendations were provided to patient.  See attached scanned questionnaire for additional information.   Signed,  Fabio Neighbors, LPN Nurse Health Advisor   Nurse Recommendations: None.

## 2018-07-06 NOTE — Progress Notes (Signed)
Patient: Diana Blanchard, Female    DOB: Nov 13, 1945, 73 y.o.   MRN: [id number] Visit Date: 07/08/2018  Today's Provider: Mar Daring, PA-C   Chief Complaint  Patient presents with  . Annual Exam   Subjective:     Complete Physical Diana Blanchard is a 73 y.o. female. She feels fairly well. She reports exercising some. She reports she is sleeping well. -----------------------------------------------------------   Review of Systems  Musculoskeletal: Positive for arthralgias, back pain, neck pain and neck stiffness.  Neurological: Positive for numbness.  Psychiatric/Behavioral:       Crying  All other systems reviewed and are negative.   Social History   Socioeconomic History  . Marital status: Married    Spouse name: Merry Proud  . Number of children: 1  . Years of education: business college  . Highest education level: Associate degree: occupational, Hotel manager, or vocational program  Occupational History  . Occupation: Rertired  Scientific laboratory technician  . Financial resource strain: Not hard at all  . Food insecurity:    Worry: Never true    Inability: Never true  . Transportation needs:    Medical: No    Non-medical: No  Tobacco Use  . Smoking status: Former Smoker    Last attempt to quit: 06/16/1969    Years since quitting: 49.0  . Smokeless tobacco: Never Used  Substance and Sexual Activity  . Alcohol use: Yes    Alcohol/week: 0.0 - 4.0 standard drinks  . Drug use: No  . Sexual activity: Not on file  Lifestyle  . Physical activity:    Days per week: 0 days    Minutes per session: 0 min  . Stress: Rather much  Relationships  . Social connections:    Talks on phone: Patient refused    Gets together: Patient refused    Attends religious service: Patient refused    Active member of club or organization: Patient refused    Attends meetings of clubs or organizations: Patient refused    Relationship status: Patient refused  . Intimate partner violence:    Fear of  current or ex partner: Patient refused    Emotionally abused: Patient refused    Physically abused: Patient refused    Forced sexual activity: Patient refused  Other Topics Concern  . Not on file  Social History Narrative  . Not on file    Past Medical History:  Diagnosis Date  . Anxiety   . Billowing mitral valve   . Depression   . GERD (gastroesophageal reflux disease)   . Headache   . Hypercholesterolemia      Patient Active Problem List   Diagnosis Date Noted  . Herpes simplex type 2 infection 05/31/2015  . Hypercholesterolemia 05/31/2015  . High potassium 05/31/2015  . Billowing mitral valve 05/31/2015  . Rotator cuff syndrome 05/31/2015  . Acid reflux 09/08/2009  . Clinical depression 12/08/2008  . Low back pain 12/08/2008  . Anxiety 11/07/2008  . Headache, migraine 11/07/2008    Past Surgical History:  Procedure Laterality Date  . bowel obstruction  1990  . BREAST CYST ASPIRATION Right 1980's  . COLONOSCOPY WITH PROPOFOL N/A 06/29/2015   Procedure: COLONOSCOPY WITH PROPOFOL;  Surgeon: Manya Silvas, MD;  Location: [facility] ENDOSCOPY;  Service: Endoscopy;  Laterality: N/A;  . cyst removed  1981   on the ovary  . LAPAROSCOPY  3086   complications from IUD  . ROTATOR CUFF REPAIR Right 06/2011  . [place]  Her family history includes Alzheimer's disease in her mother; Arthritis in her mother; CVA in her father; Diabetes in her father; HIV in her brother; Hyperlipidemia in her mother; Hypertension in her mother. There is no history of Breast cancer.      Current Outpatient Medications:  .  Ascorbic Acid (VITAMIN C) 500 MG CAPS, Take 1 capsule by mouth daily., Disp: , Rfl:  .  Cholecalciferol (VITAMIN D PO), Take 1 tablet by mouth daily., Disp: , Rfl:  .  escitalopram (LEXAPRO) 20 MG tablet, Take 1 tablet (20 mg total) by mouth daily., Disp: 90 tablet, Rfl: 1 .  simvastatin (ZOCOR) 20 MG tablet, Take 1 tablet (20 mg total) by mouth  daily., Disp: 90 tablet, Rfl: 1 .  valACYclovir (VALTREX) 500 MG tablet, Take 1 tablet (500 mg total) by mouth 2 (two) times daily. As needed, Disp: 30 tablet, Rfl: 3  Patient Care Team: Mar Daring, PA-C as PCP - General (Family Medicine) Ralene Bathe, MD (Dermatology) Arelia Sneddon, OD (Optometry)     Objective:   Vitals: BP 120/79 (BP Location: Left Arm, Cuff Size: Normal)   Pulse (!) 56   Temp 97.9 F (36.6 C) (Oral)   Resp 16   Ht 5\' 6"  (1.676 m)   Wt 151 lb (68.5 kg)   SpO2 96%   BMI 24.37 kg/m   Physical Exam Vitals signs reviewed.  Constitutional:      General: She is not in acute distress.    Appearance: Normal appearance. She is well-developed and normal weight. She is not diaphoretic.  HENT:     Head: Normocephalic and atraumatic.     Right Ear: Tympanic membrane, ear canal and external ear normal.     Left Ear: Tympanic membrane, ear canal and external ear normal.     Nose: Nose normal.     Mouth/Throat:     Pharynx: No oropharyngeal exudate.  Eyes:     General: No scleral icterus.       Right eye: No discharge.        Left eye: No discharge.     Conjunctiva/sclera: Conjunctivae normal.     Pupils: Pupils are equal, round, and reactive to light.  Neck:     Musculoskeletal: Normal range of motion and neck supple.     Thyroid: No thyromegaly.     Vascular: No carotid bruit or JVD.     Trachea: No tracheal deviation.  Cardiovascular:     Rate and Rhythm: Normal rate and regular rhythm.     Heart sounds: Normal heart sounds. No murmur. No friction rub. No gallop.   Pulmonary:     Effort: Pulmonary effort is normal. No respiratory distress.     Breath sounds: Normal breath sounds. No wheezing or rales.  Chest:     Chest wall: No tenderness.  Abdominal:     General: Bowel sounds are normal. There is no distension.     Palpations: Abdomen is soft. There is no mass.     Tenderness: There is no abdominal tenderness. There is no guarding or  rebound.  Musculoskeletal: Normal range of motion.        General: No tenderness.  Lymphadenopathy:     Cervical: No cervical adenopathy.  Skin:    General: Skin is warm and dry.     Findings: No rash.  Neurological:     Mental Status: She is alert and oriented to person, place, and time.  Psychiatric:  Behavior: Behavior normal.        Thought Content: Thought content normal.        Judgment: Judgment normal.     Activities of Daily Living In your present state of health, do you have any difficulty performing the following activities: 06/30/2018  Hearing? N  Vision? N  Comment Wears eyeglasses.   Difficulty concentrating or making decisions? N  Walking or climbing stairs? N  Dressing or bathing? N  Doing errands, shopping? N  Preparing Food and eating ? N  Using the Toilet? N  In the past six months, have you accidently leaked urine? N  Do you have problems with loss of bowel control? N  Managing your Medications? N  Managing your Finances? N  Housekeeping or managing your Housekeeping? N  Some recent data might be hidden    Fall Risk Assessment Fall Risk  06/30/2018 06/23/2017 06/07/2016 06/02/2015  Falls in the past year? 0 No No No     Depression Screen PHQ 2/9 Scores 06/30/2018 06/23/2017 06/07/2016 06/02/2015  PHQ - 2 Score 2 1 1 3   PHQ- 9 Score 2 - - 3    6CIT Screen 06/30/2018  What Year? 0 points  What month? 0 points  What time? 0 points  Count back from 20 0 points  Months in reverse 0 points  Repeat phrase 0 points  Total Score 0      Assessment & Plan:    Annual Physical Reviewed patient's Family Medical History Reviewed and updated list of patient's medical providers Assessment of cognitive impairment was done Assessed patient's functional ability Established a written schedule for health screening [place] Completed and Reviewed  Exercise Activities and Dietary recommendations Goals    . Exercise 150 minutes per  week (moderate activity)    . Exercise 3x per week (30 min per time)     Recommend to exercise for 3 days a week for at least 30 minutes at a time.        Immunization History  Administered Date(s) Administered  . Influenza Split 04/03/2009, 03/01/2010, 03/08/2011, 04/06/2012  . Influenza, High Dose Seasonal PF 04/19/2014, 04/05/2015, 04/11/2016, 03/26/2017, 04/08/2018  . Influenza,inj,Quad PF,6+ Mos 04/07/2013  . Pneumococcal Conjugate-13 06/03/2014  . Pneumococcal Polysaccharide-23 06/17/1998, 04/07/2013  . Tdap 04/09/2005, 06/02/2015  . Zoster 01/13/2008  . Zoster Recombinat (Shingrix) 12/02/2017, 04/07/2018    Health Maintenance  Topic Date Due  . MAMMOGRAM  07/05/2019  . COLONOSCOPY  06/28/2020  . DEXA SCAN  07/22/2022  . TETANUS/TDAP  06/01/2025  . INFLUENZA VACCINE  Completed  . Hepatitis C Screening  Completed  . PNA vac Low Risk Adult  Completed     Discussed health benefits of physical activity, and encouraged her to engage in regular exercise appropriate for her age and condition.    1. Annual physical exam Normal physical exam today. Will check labs as below and f/u pending lab results. If labs are stable and WNL she will not need to have these rechecked for one year at her next annual physical exam. She is to call the office in the meantime if she has any acute issue, questions or concerns. - CBC w/Diff/Platelet - Comprehensive Metabolic Panel (CMET) - Lipid Profile  2. Breast cancer screening There is no family history of breast cancer. She does perform regular self breast exams. Mammogram was ordered as below. Information for [facility] was given to patient so she may schedule her mammogram at her convenience. - MM 3D  SCREEN BREAST BILATERAL; Future  3. High potassium H/O this. Will check labs as below and f/u pending results. - Comprehensive Metabolic Panel (CMET)  4. Hypercholesterolemia Stable on simvastatin 20mg . Will check labs as below  and f/u pending results. - CBC w/Diff/Platelet - Comprehensive Metabolic Panel (CMET) - Lipid Profile  5. Chronic bilateral low back pain without sciatica H/O this. Acute flares intermittently that improve with rest and heat.   ------------------------------------------------------------------------------------------------------------    Mar Daring, PA-C  [facility]

## 2018-07-08 ENCOUNTER — Ambulatory Visit (INDEPENDENT_AMBULATORY_CARE_PROVIDER_SITE_OTHER): Payer: Medicare Other | Admitting: Physician Assistant

## 2018-07-08 ENCOUNTER — Encounter: Payer: Self-pay | Admitting: Physician Assistant

## 2018-07-08 VITALS — BP 120/79 | HR 56 | Temp 97.9°F | Resp 16 | Ht 66.0 in | Wt 151.0 lb

## 2018-07-08 DIAGNOSIS — Z0000 Encounter for general adult medical examination without abnormal findings: Secondary | ICD-10-CM | POA: Diagnosis not present

## 2018-07-08 DIAGNOSIS — Z1239 Encounter for other screening for malignant neoplasm of breast: Secondary | ICD-10-CM

## 2018-07-08 DIAGNOSIS — G8929 Other chronic pain: Secondary | ICD-10-CM

## 2018-07-08 DIAGNOSIS — M545 Low back pain, unspecified: Secondary | ICD-10-CM

## 2018-07-08 DIAGNOSIS — E875 Hyperkalemia: Secondary | ICD-10-CM | POA: Diagnosis not present

## 2018-07-08 DIAGNOSIS — E7800 Pure hypercholesterolemia, unspecified: Secondary | ICD-10-CM | POA: Diagnosis not present

## 2018-07-08 NOTE — Patient Instructions (Addendum)
Tumeric/Cucurmin 500-1000mg  daily for inflammation  Health Maintenance After Age 72 After age 88, you are at a higher risk for certain long-term diseases and infections as well as injuries from falls. Falls are a major cause of broken bones and head injuries in people who are older than age 41. Getting regular preventive care can help to keep you healthy and well. Preventive care includes getting regular testing and making lifestyle changes as recommended by your health care provider. Talk with your health care provider about:  Which screenings and tests you should have. A screening is a test that checks for a disease when you have no symptoms.  A diet and exercise plan that is right for you. What should I know about screenings and tests to prevent falls? Screening and testing are the best ways to find a health problem early. Early diagnosis and treatment give you the best chance of managing medical conditions that are common after age 57. Certain conditions and lifestyle choices may make you more likely to have a fall. Your health care provider may recommend:  Regular vision checks. Poor vision and conditions such as cataracts can make you more likely to have a fall. If you wear glasses, make sure to get your prescription updated if your vision changes.  Medicine review. Work with your health care provider to regularly review all of the medicines you are taking, including over-the-counter medicines. Ask your health care provider about any side effects that may make you more likely to have a fall. Tell your health care provider if any medicines that you take make you feel dizzy or sleepy.  Osteoporosis screening. Osteoporosis is a condition that causes the bones to get weaker. This can make the bones weak and cause them to break more easily.  Blood pressure screening. Blood pressure changes and medicines to control blood pressure can make you feel dizzy.  Strength and balance checks. Your health  care provider may recommend certain tests to check your strength and balance while standing, walking, or changing positions.  Foot health exam. Foot pain and numbness, as well as not wearing proper footwear, can make you more likely to have a fall.  Depression screening. You may be more likely to have a fall if you have a fear of falling, feel emotionally low, or feel unable to do activities that you used to do.  Alcohol use screening. Using too much alcohol can affect your balance and may make you more likely to have a fall. What actions can I take to lower my risk of falls? General instructions  Talk with your health care provider about your risks for falling. Tell your health care provider if: ? You fall. Be sure to tell your health care provider about all falls, even ones that seem minor. ? You feel dizzy, sleepy, or off-balance.  Take over-the-counter and prescription medicines only as told by your health care provider. These include any supplements.  Eat a healthy diet and maintain a healthy weight. A healthy diet includes low-fat dairy products, low-fat (lean) meats, and fiber from whole grains, beans, and lots of fruits and vegetables. Home safety  Remove any tripping hazards, such as rugs, cords, and clutter.  Install safety equipment such as grab bars in bathrooms and safety rails on stairs.  Keep rooms and walkways well-lit. Activity   Follow a regular exercise program to stay fit. This will help you maintain your balance. Ask your health care provider what types of exercise are appropriate for you.  If you need a cane or walker, use it as recommended by your health care provider.  Wear supportive shoes that have nonskid soles. Lifestyle  Do not drink alcohol if your health care provider tells you not to drink.  If you drink alcohol, limit how much you have: ? 0-1 drink a day for women. ? 0-2 drinks a day for men.  Be aware of how much alcohol is in your drink. In  the [place] one drink equals one typical bottle of beer (12 oz), one-half glass of wine (5 oz), or one shot of hard liquor (1 oz).  Do not use any products that contain nicotine or tobacco, such as cigarettes and e-cigarettes. If you need help quitting, ask your health care provider. Summary  Having a healthy lifestyle and getting preventive care can help to protect your health and wellness after age 74.  Screening and testing are the best way to find a health problem early and help you avoid having a fall. Early diagnosis and treatment give you the best chance for managing medical conditions that are more common for people who are older than age 64.  Falls are a major cause of broken bones and head injuries in people who are older than age 60. Take precautions to prevent a fall at home.  Work with your health care provider to learn what changes you can make to improve your health and wellness and to prevent falls. This information is not intended to replace advice given to you by your health care provider. Make sure you discuss any questions you have with your health care provider. Document Released: 04/16/2017 Document Revised: 04/16/2017 Document Reviewed: 04/16/2017 Elsevier Interactive Patient Education  2019 [employer].

## 2018-07-13 DIAGNOSIS — Z0000 Encounter for general adult medical examination without abnormal findings: Secondary | ICD-10-CM | POA: Diagnosis not present

## 2018-07-13 DIAGNOSIS — E875 Hyperkalemia: Secondary | ICD-10-CM | POA: Diagnosis not present

## 2018-07-13 DIAGNOSIS — E7800 Pure hypercholesterolemia, unspecified: Secondary | ICD-10-CM | POA: Diagnosis not present

## 2018-07-14 ENCOUNTER — Ambulatory Visit
Admission: RE | Admit: 2018-07-14 | Discharge: 2018-07-14 | Disposition: A | Discharge status: Home / Self Care | Payer: Medicare Other | Source: Ambulatory Visit | Attending: Physician Assistant | Admitting: Physician Assistant

## 2018-07-14 DIAGNOSIS — Z1239 Encounter for other screening for malignant neoplasm of breast: Secondary | ICD-10-CM

## 2018-07-14 DIAGNOSIS — Z1231 Encounter for screening mammogram for malignant neoplasm of breast: Secondary | ICD-10-CM | POA: Diagnosis not present

## 2018-07-14 LAB — COMPREHENSIVE METABOLIC PANEL
ALBUMIN: 4.5 g/dL (ref 3.7–4.7)
ALK PHOS: 72 IU/L (ref 39–117)
ALT: 5 IU/L (ref 0–32)
AST: 18 IU/L (ref 0–40)
Albumin/Globulin Ratio: 2.5 — ABNORMAL HIGH (ref 1.2–2.2)
BILIRUBIN TOTAL: 0.6 mg/dL (ref 0.0–1.2)
BUN / CREAT RATIO: 17 (ref 12–28)
BUN: 16 mg/dL (ref 8–27)
CHLORIDE: 104 mmol/L (ref 96–106)
CO2: 23 mmol/L (ref 20–29)
Calcium: 9.6 mg/dL (ref 8.7–10.3)
Creatinine, Ser: 0.92 mg/dL (ref 0.57–1.00)
GFR calc Af Amer: 72 mL/min/{1.73_m2} (ref >59)
GFR calc non Af Amer: 63 mL/min/{1.73_m2} (ref >59)
GLOBULIN, TOTAL: 1.8 g/dL (ref 1.5–4.5)
Glucose: 90 mg/dL (ref 65–99)
Potassium: 4.3 mmol/L (ref 3.5–5.2)
Sodium: 141 mmol/L (ref 134–144)
Total Protein: 6.3 g/dL (ref 6.0–8.5)

## 2018-07-14 LAB — CBC WITH DIFFERENTIAL/PLATELET
BASOS: 1 %
Basophils Absolute: 0 10*3/uL (ref 0.0–0.2)
EOS (ABSOLUTE): 0.2 10*3/uL (ref 0.0–0.4)
EOS: 4 %
HEMATOCRIT: 37.1 % (ref 34.0–46.6)
HEMOGLOBIN: 12.7 g/dL (ref 11.1–15.9)
IMMATURE GRANS (ABS): 0 10*3/uL (ref 0.0–0.1)
Immature Granulocytes: 0 %
LYMPHS ABS: 1.7 10*3/uL (ref 0.7–3.1)
Lymphs: 37 %
MCH: 33 pg (ref 26.6–33.0)
MCHC: 34.2 g/dL (ref 31.5–35.7)
MCV: 96 fL (ref 79–97)
MONOCYTES: 10 %
Monocytes Absolute: 0.5 10*3/uL (ref 0.1–0.9)
NEUTROS ABS: 2.3 10*3/uL (ref 1.4–7.0)
Neutrophils: 48 %
Platelets: 249 10*3/uL (ref 150–450)
RBC: 3.85 x10E6/uL (ref 3.77–5.28)
RDW: 12 % (ref 11.7–15.4)
WBC: 4.7 10*3/uL (ref 3.4–10.8)

## 2018-07-14 LAB — LIPID PANEL
CHOLESTEROL TOTAL: 177 mg/dL (ref 100–199)
Chol/HDL Ratio: 2.3 ratio (ref 0.0–4.4)
HDL: 78 mg/dL (ref >39)
LDL CALC: 85 mg/dL (ref 0–99)
Triglycerides: 71 mg/dL (ref 0–149)
VLDL Cholesterol Cal: 14 mg/dL (ref 5–40)

## 2018-07-15 ENCOUNTER — Telehealth: Payer: Self-pay

## 2018-07-15 NOTE — Telephone Encounter (Signed)
-----   Message from Mar Daring, [place] sent at 07/15/2018  7:54 AM EST ----- Blood count is normal. Kidney and liver function normal. Sugar normal. Cholesterol much improved from last year and lowest it has been in the last 3 years! Keep up the good work and continue medications.

## 2018-07-15 NOTE — Telephone Encounter (Signed)
Patient was advised.  

## 2018-08-09 DIAGNOSIS — J209 Acute bronchitis, unspecified: Secondary | ICD-10-CM | POA: Diagnosis not present

## 2018-08-11 ENCOUNTER — Telehealth: Payer: Self-pay

## 2018-08-11 NOTE — Telephone Encounter (Signed)
Patient calling that she went to the Urgent Care on Sunday and was dx with Bronchitis. Patient reports that she was given a steroid injection and was prescribed Zpak,prednisone and inhaler. Per patient she is feeling better but thinks that she has a reaction to the prednisone and wants to know if is ok to stop it. She reports that her face feels hot and it looks swollen. Told patient that yes is ok to stop the Prednisone and to take some Benadryl.If symptoms get worse to call us or go the emergency room. Patient agreed. Patient denies sob, throat closing,choking,no tongue swelling. Dr. B advised.

## 2018-08-26 ENCOUNTER — Other Ambulatory Visit: Payer: Self-pay | Admitting: Physician Assistant

## 2018-08-26 DIAGNOSIS — E7800 Pure hypercholesterolemia, unspecified: Secondary | ICD-10-CM

## 2018-08-26 DIAGNOSIS — F419 Anxiety disorder, unspecified: Secondary | ICD-10-CM

## 2018-09-23 DIAGNOSIS — D485 Neoplasm of uncertain behavior of skin: Secondary | ICD-10-CM | POA: Diagnosis not present

## 2018-09-23 DIAGNOSIS — Z1283 Encounter for screening for malignant neoplasm of skin: Secondary | ICD-10-CM | POA: Diagnosis not present

## 2018-09-23 DIAGNOSIS — Z85828 Personal history of other malignant neoplasm of skin: Secondary | ICD-10-CM | POA: Diagnosis not present

## 2018-09-23 DIAGNOSIS — L820 Inflamed seborrheic keratosis: Secondary | ICD-10-CM | POA: Diagnosis not present

## 2018-12-04 IMAGING — MG MM DIGITAL SCREENING BILAT W/ TOMO W/ CAD
9 of 13 series · 9 of 29 positions shown · non-contrast
Comparison: Previous exam(s).

CLINICAL DATA: Screening.

EXAM:
2D DIGITAL SCREENING BILATERAL MAMMOGRAM WITH 3D TOMO WITH CAD

[R MLO (1 of 2)]
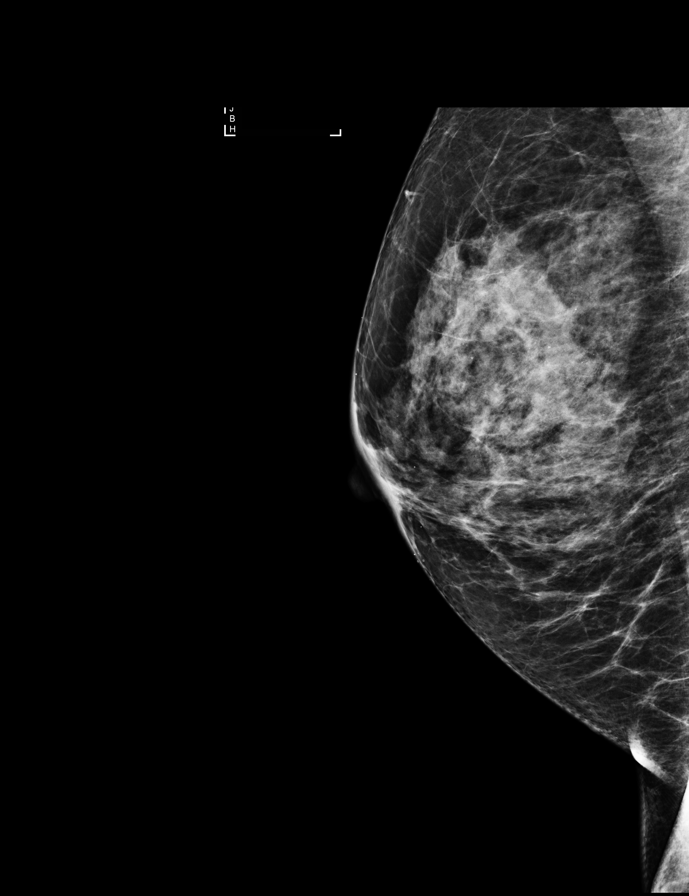

[R MLO (2 of 2)]
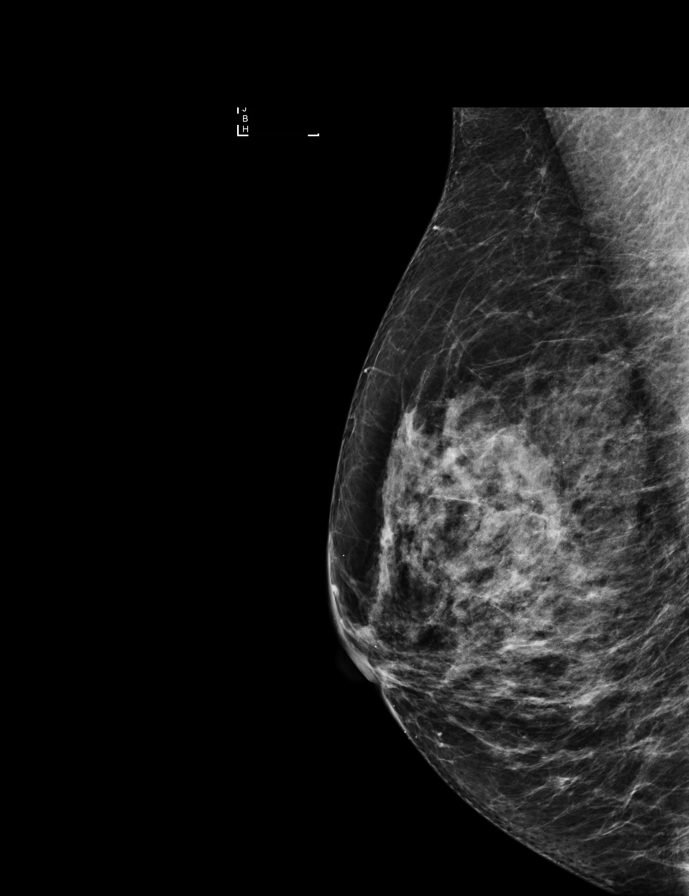

[R CC]
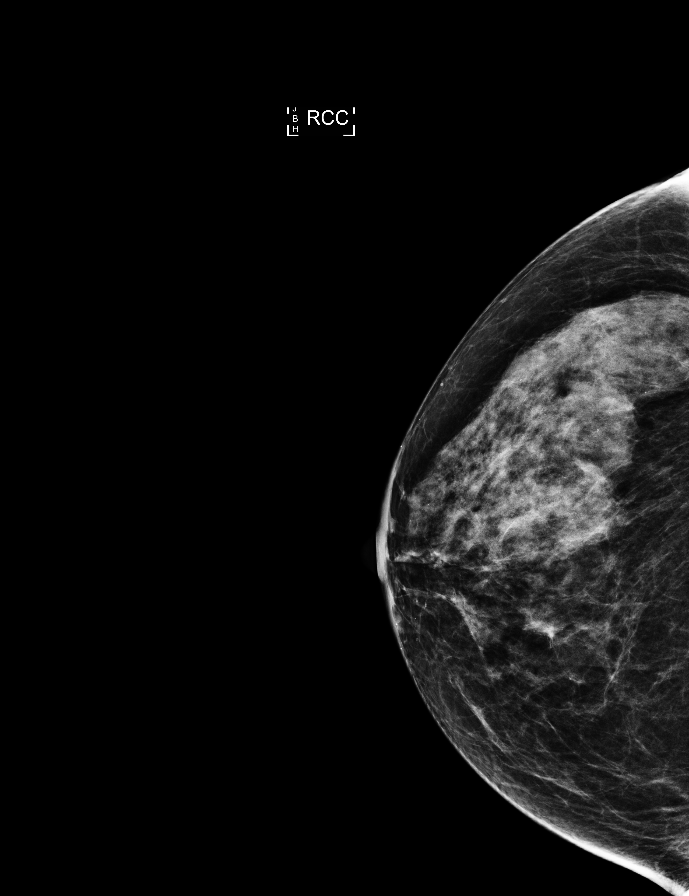

[R MLO synth-2D]
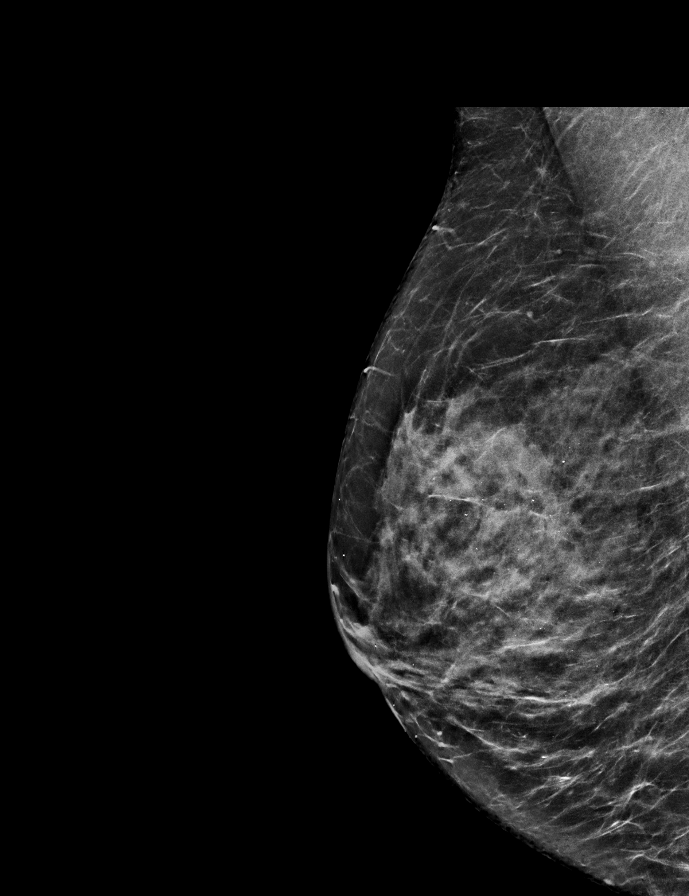

[L MLO synth-2D]
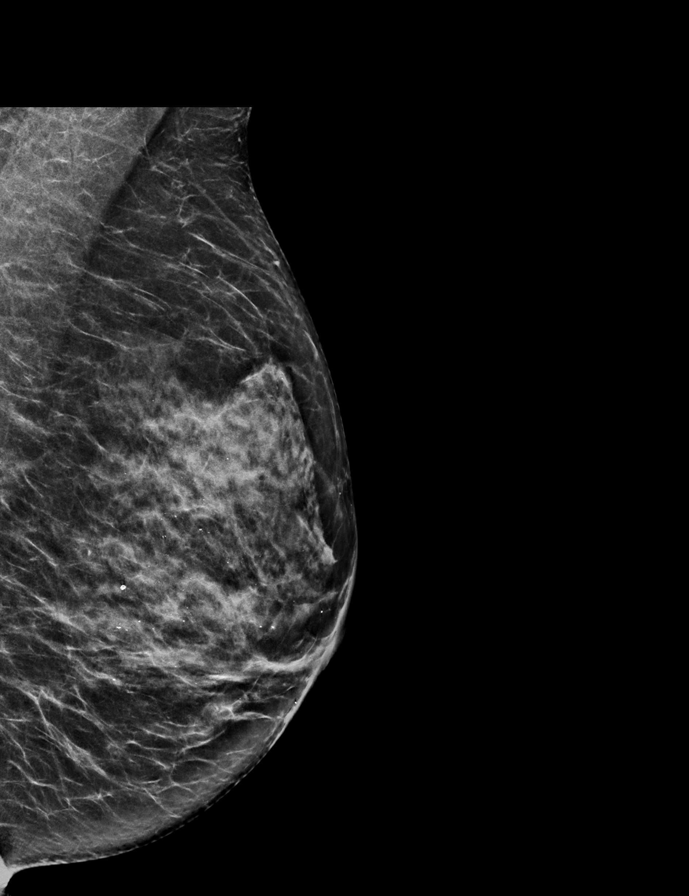

[L CC]
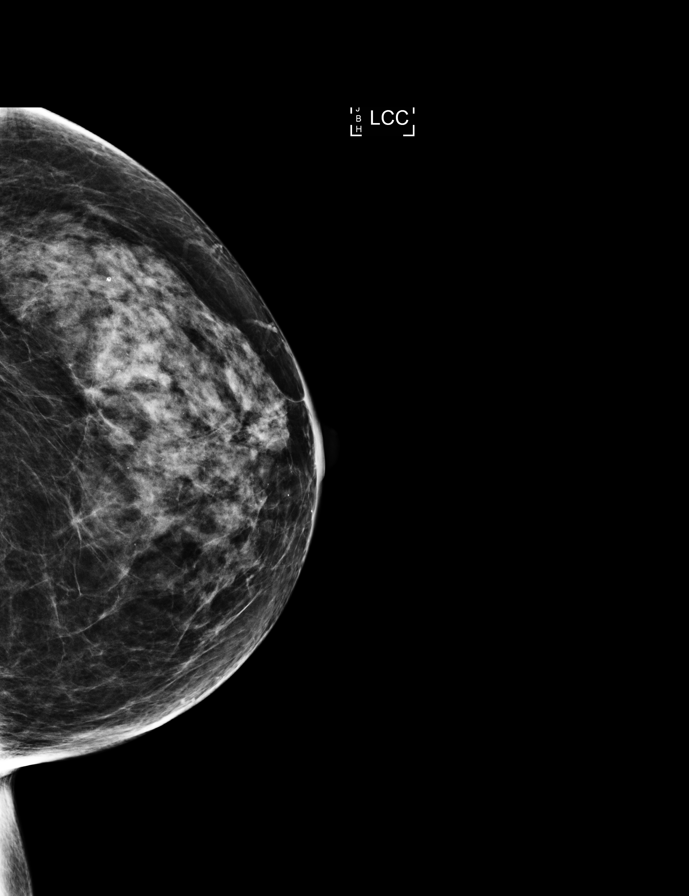

[R CC synth-2D]
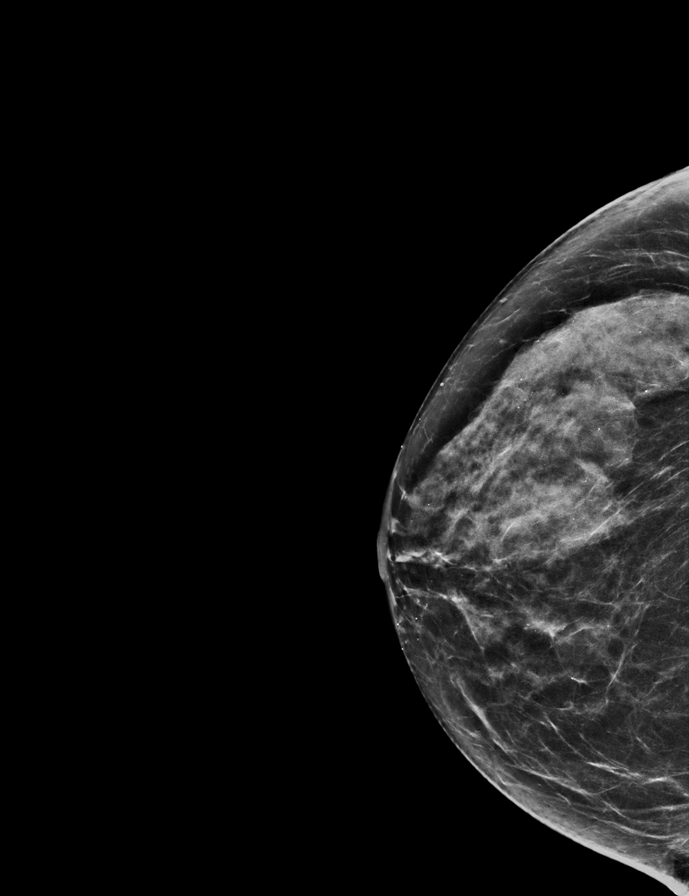

[L MLO]
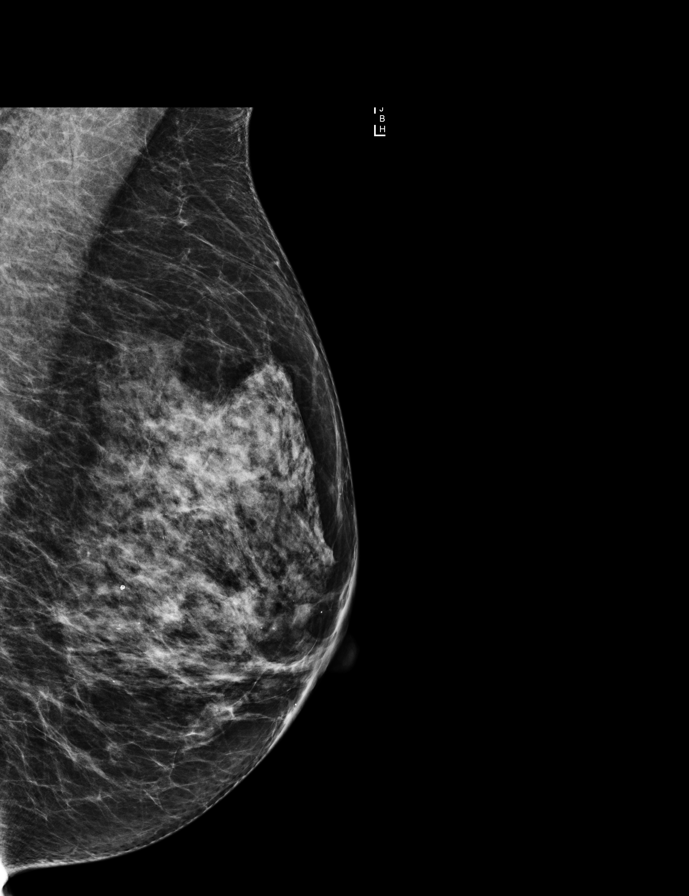

[L CC synth-2D]
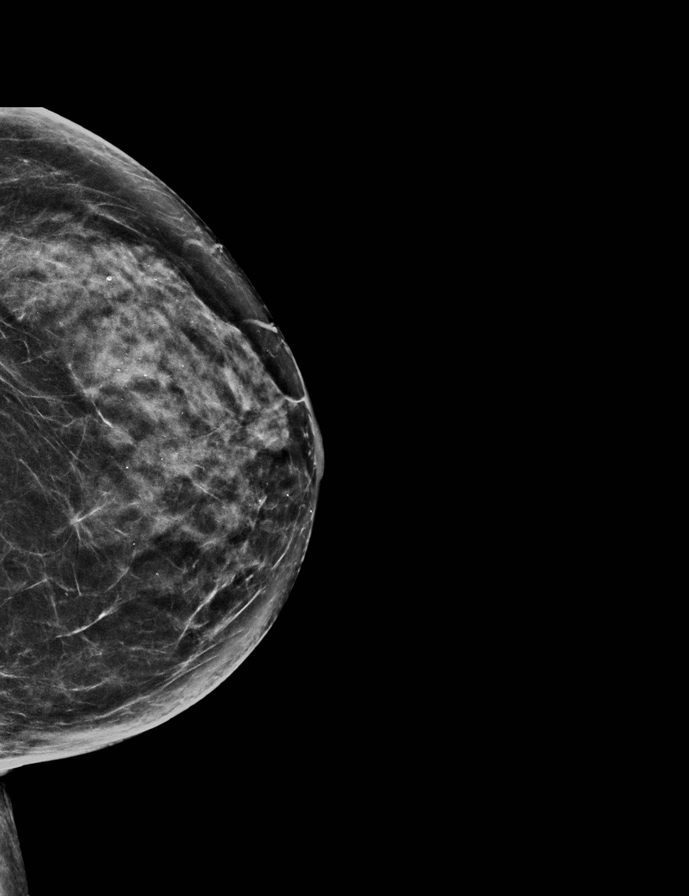

[9 of 29 positions shown; findings below may reference images not displayed]

ACR Breast Density Category c: The breast tissue is heterogeneously
dense, which may obscure small masses.
FINDINGS: There are no findings suspicious for malignancy. Images were
processed with CAD.
IMPRESSION: No mammographic evidence of malignancy. A result letter of this
screening mammogram will be mailed directly to the patient.

RECOMMENDATION:
Screening mammogram in one year. (Code:[id number])

BI-RADS CATEGORY  1: Negative.

## 2019-02-04 ENCOUNTER — Other Ambulatory Visit: Payer: Self-pay | Admitting: Physician Assistant

## 2019-02-04 DIAGNOSIS — F419 Anxiety disorder, unspecified: Secondary | ICD-10-CM

## 2019-02-04 DIAGNOSIS — E7800 Pure hypercholesterolemia, unspecified: Secondary | ICD-10-CM

## 2019-02-12 ENCOUNTER — Other Ambulatory Visit: Payer: Self-pay | Admitting: Physician Assistant

## 2019-02-12 DIAGNOSIS — B009 Herpesviral infection, unspecified: Secondary | ICD-10-CM

## 2019-02-25 ENCOUNTER — Other Ambulatory Visit: Payer: Self-pay | Admitting: Physician Assistant

## 2019-02-25 DIAGNOSIS — B009 Herpesviral infection, unspecified: Secondary | ICD-10-CM

## 2019-03-17 ENCOUNTER — Ambulatory Visit (INDEPENDENT_AMBULATORY_CARE_PROVIDER_SITE_OTHER): Payer: Medicare Other

## 2019-03-17 ENCOUNTER — Other Ambulatory Visit: Payer: Self-pay

## 2019-03-17 DIAGNOSIS — Z23 Encounter for immunization: Secondary | ICD-10-CM | POA: Diagnosis not present

## 2019-05-18 ENCOUNTER — Telehealth: Payer: Self-pay

## 2019-05-18 NOTE — Telephone Encounter (Signed)
Copied from [place] [phone] [phone]. Topic: General - Call Back - No Documentation >> May 17, 2019  4:48 PM Erick Blinks wrote: Reason for CRM: Pt called requesting call back, has a form that needs to be filled out by PCP. Its a mental health clearance form needed to renew her license to carry concealed weapons  Best contact: [phone]

## 2019-05-19 NOTE — Telephone Encounter (Signed)
Patient states she was calling to see if we had a clearance form in office for carry concealed weapons. Patient states she will get the form and drop it off for [facility] to complete. Patient advised she may need a virtual visit for form to be completed, but someone will let her know if so.

## 2019-06-23 ENCOUNTER — Other Ambulatory Visit: Payer: Self-pay

## 2019-06-23 ENCOUNTER — Other Ambulatory Visit: Payer: Self-pay | Admitting: Physician Assistant

## 2019-06-23 DIAGNOSIS — Z1231 Encounter for screening mammogram for malignant neoplasm of breast: Secondary | ICD-10-CM

## 2019-07-05 NOTE — Progress Notes (Addendum)
Subjective:   Diana Blanchard is a 74 y.o. female who presents for Medicare Annual (Subsequent) preventive examination.    This visit is being conducted through telemedicine due to the COVID-19 pandemic. This patient has given me verbal consent via doximity to conduct this visit, patient states they are participating from their home address. Some vital signs may be absent or patient reported.    Patient identification: identified by name, DOB, and current address  Review of Systems:  N/A  Cardiac Risk Factors include: advanced age (>63men, >29 women);dyslipidemia     Objective:     Vitals: There were no vitals taken for this visit.  There is no height or weight on file to calculate BMI. Unable to obtain vitals due to visit being conducted via telephonically.   Advanced Directives 07/06/2019 06/30/2018 08/06/2016 06/07/2016 06/02/2015  Does Patient Have a Medical Advance Directive? Yes Yes Yes Yes Yes  Type of Paramedic of [place];Living will [place];Living will [place];Living will [place];Living will [place];Living will  Copy of [place] in Chart? No - copy requested No - copy requested - - -    Tobacco Social History   Tobacco Use  Smoking Status Former Smoker  . Quit date: 06/16/1969  . Years since quitting: 50.0  Smokeless Tobacco Never Used     Counseling given: Not Answered   Clinical Intake:  Pre-visit preparation completed: Yes  Pain : No/denies pain Pain Score: 0-No pain     Nutritional Risks: None Diabetes: No  How often do you need to have someone help you when you read instructions, pamphlets, or other written materials from your doctor or pharmacy?: 1 - Never  Interpreter Needed?: No  Information entered by :: [facility], LPN  Past Medical History:  Diagnosis Date  . Anxiety   . Billowing mitral valve   . Depression   .  GERD (gastroesophageal reflux disease)   . Headache   . Hypercholesterolemia    Past Surgical History:  Procedure Laterality Date  . bowel obstruction  1990  . BREAST CYST ASPIRATION Right 1980's  . COLONOSCOPY WITH PROPOFOL N/A 06/29/2015   Procedure: COLONOSCOPY WITH PROPOFOL;  Surgeon: Manya Silvas, MD;  Location: [facility] ENDOSCOPY;  Service: Endoscopy;  Laterality: N/A;  . cyst removed  1981   on the ovary  . LAPAROSCOPY  [id number]   complications from IUD  . ROTATOR CUFF REPAIR Right 06/2011  . TONSILLECTOMY AND ADENOIDECTOMY  1953   Family History  Problem Relation Age of Onset  . Arthritis Mother   . Hyperlipidemia Mother   . Hypertension Mother   . Alzheimer's disease Mother   . Diabetes Father   . CVA Father   . HIV Brother   . Breast cancer Neg Hx    Social History   Socioeconomic History  . Marital status: Married    Spouse name: Merry Proud  . Number of children: 1  . Years of education: business college  . Highest education level: Associate degree: occupational, Hotel manager, or vocational program  Occupational History  . Occupation: Rertired  Tobacco Use  . Smoking status: Former Smoker    Quit date: 06/16/1969    Years since quitting: 50.0  . Smokeless tobacco: Never Used  Substance and Sexual Activity  . Alcohol use: Yes    Alcohol/week: 4.0 - 6.0 standard drinks    Types: 4 - 6 Glasses of wine per week  . Drug  use: No  . Sexual activity: Not on file  Other Topics Concern  . Not on file  Social History Narrative  . Not on file   Social Determinants of Health   Financial Resource Strain: Low Risk   . Difficulty of Paying Living Expenses: Not hard at all  Food Insecurity: No Food Insecurity  . Worried About Charity fundraiser in the Last Year: Never true  . Ran Out of Food in the Last Year: Never true  Transportation Needs: No Transportation Needs  . Lack of Transportation (Medical): No  . Lack of Transportation (Non-Medical): No  Physical Activity:  Inactive  . Days of Exercise per Week: 0 days  . Minutes of Exercise per Session: 0 min  Stress: Stress Concern Present  . Feeling of Stress : Rather much  Social Connections: Slightly Isolated  . Frequency of Communication with Friends and Family: More than three times a week  . Frequency of Social Gatherings with Friends and Family: More than three times a week  . Attends Religious Services: More than 4 times per year  . Active Member of Clubs or Organizations: No  . Attends Archivist Meetings: Never  . Marital Status: Married    Outpatient Encounter Medications as of 07/06/2019  Medication Sig  . escitalopram (LEXAPRO) 20 MG tablet TAKE 1 TABLET BY MOUTH EVERY DAY  . Multiple Vitamins-Minerals (WOMENS 50+ MULTI VITAMIN/MIN) TABS Take by mouth daily.  . simvastatin (ZOCOR) 20 MG tablet TAKE 1 TABLET BY MOUTH EVERY DAY  . valACYclovir (VALTREX) 500 MG tablet TAKE 1 TABLET (500 MG TOTAL) BY MOUTH 2 (TWO) TIMES DAILY. AS NEEDED  . Ascorbic Acid (VITAMIN C) 500 MG CAPS Take 1 capsule by mouth daily.  . Cholecalciferol (VITAMIN D PO) Take 1 tablet by mouth daily.   No facility-administered encounter medications on file as of 07/06/2019.    Activities of Daily Living In your present state of health, do you have any difficulty performing the following activities: 07/06/2019  Hearing? N  Vision? N  Difficulty concentrating or making decisions? N  Walking or climbing stairs? N  Dressing or bathing? N  Doing errands, shopping? N  Preparing Food and eating ? N  Using the Toilet? N  In the past six months, have you accidently leaked urine? N  Do you have problems with loss of bowel control? N  Managing your Medications? N  Managing your Finances? N  Housekeeping or managing your Housekeeping? N  Some recent data might be hidden    Patient Care Team: Rubye Beach as PCP - General (Family Medicine) Ralene Bathe, MD (Dermatology) Arelia Sneddon, OD  (Optometry)    Assessment:   This is a routine wellness examination for Diana Blanchard.  Exercise Activities and Dietary recommendations Current Exercise Habits: The patient does not participate in regular exercise at present, Exercise limited by: None identified  Goals    . Exercise 150 minutes per week (moderate activity)    . Exercise 3x per week (30 min per time)     Recommend to exercise for 3 days a week for at least 30 minutes at a time.        Fall Risk: Fall Risk  07/06/2019 06/30/2018 06/23/2017 06/07/2016 06/02/2015  Falls in the past year? 0 0 No No No  Number falls in past yr: 0 - - - -  Injury with Fall? 0 - - - -    FALL RISK PREVENTION PERTAINING TO THE HOME:  Any stairs in or around the home? Yes  If so, are there any without handrails? No   Home free of loose throw rugs in walkways, pet beds, electrical cords, etc? Yes  Adequate lighting in your home to reduce risk of falls? Yes   ASSISTIVE DEVICES UTILIZED TO PREVENT FALLS:  Life alert? No  Use of a cane, walker or w/c? No  Grab bars in the bathroom? Yes  Shower chair or bench in shower? No  Elevated toilet seat or a handicapped toilet? Yes    TIMED UP AND GO:  Was the test performed? No .    Depression Screen PHQ 2/9 Scores 07/06/2019 07/06/2019 06/30/2018 06/23/2017  PHQ - 2 Score 1 1 2 1   PHQ- 9 Score - - 2 -     Cognitive Function     6CIT Screen 07/06/2019 06/30/2018  What Year? 0 points 0 points  What month? 0 points 0 points  What time? 0 points 0 points  Count back from 20 0 points 0 points  Months in reverse 0 points 0 points  Repeat phrase 0 points 0 points  Total Score 0 0    Immunization History  Administered Date(s) Administered  . Fluad Quad(high Dose 65+) 03/17/2019  . Influenza Split 04/03/2009, 03/01/2010, 03/08/2011, 04/06/2012  . Influenza, High Dose Seasonal PF 04/19/2014, 04/05/2015, 04/11/2016, 03/26/2017, 04/08/2018  . Influenza,inj,Quad PF,6+ Mos 04/07/2013  . Pneumococcal  Conjugate-13 06/03/2014  . Pneumococcal Polysaccharide-23 06/17/1998, 04/07/2013  . Tdap 04/09/2005, 06/02/2015  . Zoster 01/13/2008  . Zoster Recombinat (Shingrix) 12/02/2017, 04/07/2018    Qualifies for Shingles Vaccine? Completed series  Tdap: Up to date  Flu Vaccine: Up to date  Pneumococcal Vaccine: Completed series  Screening Tests Health Maintenance  Topic Date Due  . COLONOSCOPY  06/28/2020  . MAMMOGRAM  07/14/2020  . DEXA SCAN  07/22/2022  . TETANUS/TDAP  06/01/2025  . INFLUENZA VACCINE  Completed  . Hepatitis C Screening  Completed  . PNA vac Low Risk Adult  Completed    Cancer Screenings:  Colorectal Screening: Completed 06/29/15. Repeat every 5 years.   Mammogram: Completed 07/14/18. Repeat every 1-2 years as advised.   Bone Density: Completed 07/22/17. Results reflect OSTEOPENIA. Repeat every 5 years.   Lung Cancer Screening: (Low Dose CT Chest recommended if Age 54-80 years, 30 pack-year currently smoking OR have quit w/in 15years.) does not qualify.   Additional Screening:  Hepatitis C Screening: Up to date  Vision Screening: Recommended annual ophthalmology exams for early detection of glaucoma and other disorders of the eye.  Dental Screening: Recommended annual dental exams for proper oral hygiene  Community Resource Referral:  CRR required this visit?  No       Plan:  I have personally reviewed and addressed the Medicare Annual Wellness questionnaire and have noted the following in the patient's chart:  A. Medical and social history B. Use of alcohol, tobacco or illicit drugs  C. Current medications and supplements D. Functional ability and status E.  Nutritional status F.  Physical activity G. Advance directives H. List of other physicians I.  Hospitalizations, surgeries, and ER visits in previous 12 months J.  [place] such as hearing and vision if needed, cognitive and depression L. Referrals and appointments   In  addition, I have reviewed and discussed with patient certain preventive protocols, quality metrics, and best practice recommendations. A written personalized care plan for preventive services as well as general preventive health recommendations were provided to patient. Nurse Health  Advisor  Signed,    Fabio Neighbors, [place]  [id number] Nurse Health Advisor   Nurse Notes: None.

## 2019-07-06 ENCOUNTER — Other Ambulatory Visit: Payer: Self-pay

## 2019-07-06 ENCOUNTER — Ambulatory Visit (INDEPENDENT_AMBULATORY_CARE_PROVIDER_SITE_OTHER): Payer: Medicare Other

## 2019-07-06 DIAGNOSIS — Z0000 Encounter for general adult medical examination without abnormal findings: Secondary | ICD-10-CM | POA: Diagnosis not present

## 2019-07-06 NOTE — Patient Instructions (Signed)
[name] , Thank you for taking time to come for your Medicare Wellness Visit. I appreciate your ongoing commitment to your health goals. Please review the following plan we discussed and let me know if I can assist you in the future.   Screening recommendations/referrals: Colonoscopy: Up to date, due 06/2020 Mammogram: Up to date, ordered 07/16/19 Bone Density: Up to date, due 07/2022 Recommended yearly ophthalmology/optometry visit for glaucoma screening and checkup Recommended yearly dental visit for hygiene and checkup  Vaccinations: Influenza vaccine: Up to date Pneumococcal vaccine: Completed series Tdap vaccine: Up to date, due 05/2025 Shingles vaccine: Completed series    Advanced directives: Please bring a copy of your POA (Power of [place]) and/or Living Will to your next appointment.   Conditions/risks identified: Recommend to exercise 3 days a week for at least 30 minutes at a time.   Next appointment: 07/08/19 @ 2:00 PM with Diana Blanchard.    Preventive Care 60 Years and Older, Female Preventive care refers to lifestyle choices and visits with your health care provider that can promote health and wellness. What does preventive care include?  A yearly physical exam. This is also called an annual well check.  Dental exams once or twice a year.  Routine eye exams. Ask your health care provider how often you should have your eyes checked.  Personal lifestyle choices, including:  Daily care of your teeth and gums.  Regular physical activity.  Eating a healthy diet.  Avoiding tobacco and drug use.  Limiting alcohol use.  Practicing safe sex.  Taking low-dose aspirin every day.  Taking vitamin and mineral supplements as recommended by your health care provider. What happens during an annual well check? The services and screenings done by your health care provider during your annual well check will depend on your age, overall health, lifestyle risk factors,  and family history of disease. Counseling  Your health care provider may ask you questions about your:  Alcohol use.  Tobacco use.  Drug use.  Emotional well-being.  Home and relationship well-being.  Sexual activity.  Eating habits.  History of falls.  Memory and ability to understand (cognition).  Work and work Statistician.  Reproductive health. Screening  You may have the following tests or measurements:  Height, weight, and BMI.  Blood pressure.  Lipid and cholesterol levels. These may be checked every 5 years, or more frequently if you are over 57 years old.  Skin check.  Lung cancer screening. You may have this screening every year starting at age 73 if you have a 30-pack-year history of smoking and currently smoke or have quit within the past 15 years.  Fecal occult blood test (FOBT) of the stool. You may have this test every year starting at age 73.  Flexible sigmoidoscopy or colonoscopy. You may have a sigmoidoscopy every 5 years or a colonoscopy every 10 years starting at age 73.  Hepatitis C blood test.  Hepatitis B blood test.  Sexually transmitted disease (STD) testing.  Diabetes screening. This is done by checking your blood sugar (glucose) after you have not eaten for a while (fasting). You may have this done every 1-3 years.  Bone density scan. This is done to screen for osteoporosis. You may have this done starting at age 73.  Mammogram. This may be done every 1-2 years. Talk to your health care provider about how often you should have regular mammograms. Talk with your health care provider about your test results, treatment options, and if necessary, the  need for more tests. Vaccines  Your health care provider may recommend certain vaccines, such as:  Influenza vaccine. This is recommended every year.  Tetanus, diphtheria, and acellular pertussis (Tdap, Td) vaccine. You may need a Td booster every 10 years.  Zoster vaccine. You may need  this after age 73.  Pneumococcal 13-valent conjugate (PCV13) vaccine. One dose is recommended after age 11.  Pneumococcal polysaccharide (PPSV23) vaccine. One dose is recommended after age 84. Talk to your health care provider about which screenings and vaccines you need and how often you need them. This information is not intended to replace advice given to you by your health care provider. Make sure you discuss any questions you have with your health care provider. Document Released: 06/30/2015 Document Revised: 02/21/2016 Document Reviewed: 04/04/2015 Elsevier Interactive Patient Education  2017 [place] Prevention in the Home Falls can cause injuries. They can happen to people of all ages. There are many things you can do to make your home safe and to help prevent falls. What can I do on the outside of my home?  Regularly fix the edges of walkways and driveways and fix any cracks.  Remove anything that might make you trip as you walk through a door, such as a raised step or threshold.  Trim any bushes or trees on the path to your home.  Use bright outdoor lighting.  Clear any walking paths of anything that might make someone trip, such as rocks or tools.  Regularly check to see if handrails are loose or broken. Make sure that both sides of any steps have handrails.  Any raised decks and porches should have guardrails on the edges.  Have any leaves, snow, or ice cleared regularly.  Use sand or salt on walking paths during winter.  Clean up any spills in your garage right away. This includes oil or grease spills. What can I do in the bathroom?  Use night lights.  Install grab bars by the toilet and in the tub and shower. Do not use towel bars as grab bars.  Use non-skid mats or decals in the tub or shower.  If you need to sit down in the shower, use a plastic, non-slip stool.  Keep the floor dry. Clean up any water that spills on the floor as soon as it  happens.  Remove soap buildup in the tub or shower regularly.  Attach bath mats securely with double-sided non-slip rug tape.  Do not have throw rugs and other things on the floor that can make you trip. What can I do in the bedroom?  Use night lights.  Make sure that you have a light by your bed that is easy to reach.  Do not use any sheets or blankets that are too big for your bed. They should not hang down onto the floor.  Have a firm chair that has side arms. You can use this for support while you get dressed.  Do not have throw rugs and other things on the floor that can make you trip. What can I do in the kitchen?  Clean up any spills right away.  Avoid walking on wet floors.  Keep items that you use a lot in easy-to-reach places.  If you need to reach something above you, use a strong step stool that has a grab bar.  Keep electrical cords out of the way.  Do not use floor polish or wax that makes floors slippery. If you must use wax,  use non-skid floor wax.  Do not have throw rugs and other things on the floor that can make you trip. What can I do with my stairs?  Do not leave any items on the stairs.  Make sure that there are handrails on both sides of the stairs and use them. Fix handrails that are broken or loose. Make sure that handrails are as long as the stairways.  Check any carpeting to make sure that it is firmly attached to the stairs. Fix any carpet that is loose or worn.  Avoid having throw rugs at the top or bottom of the stairs. If you do have throw rugs, attach them to the floor with carpet tape.  Make sure that you have a light switch at the top of the stairs and the bottom of the stairs. If you do not have them, ask someone to add them for you. What else can I do to help prevent falls?  Wear shoes that:  Do not have high heels.  Have rubber bottoms.  Are comfortable and fit you well.  Are closed at the toe. Do not wear sandals.  If you  use a stepladder:  Make sure that it is fully opened. Do not climb a closed stepladder.  Make sure that both sides of the stepladder are locked into place.  Ask someone to hold it for you, if possible.  Clearly mark and make sure that you can see:  Any grab bars or handrails.  First and last steps.  Where the edge of each step is.  Use tools that help you move around (mobility aids) if they are needed. These include:  Canes.  Walkers.  Scooters.  Crutches.  Turn on the lights when you go into a dark area. Replace any light bulbs as soon as they burn out.  Set up your furniture so you have a clear path. Avoid moving your furniture around.  If any of your floors are uneven, fix them.  If there are any pets around you, be aware of where they are.  Review your medicines with your doctor. Some medicines can make you feel dizzy. This can increase your chance of falling. Ask your doctor what other things that you can do to help prevent falls. This information is not intended to replace advice given to you by your health care provider. Make sure you discuss any questions you have with your health care provider. Document Released: 03/30/2009 Document Revised: 11/09/2015 Document Reviewed: 07/08/2014 Elsevier Interactive Patient Education  2017 [employer].

## 2019-07-15 NOTE — Progress Notes (Signed)
Patient: Diana Blanchard, Female    DOB: 07/13/1945, 74 y.o.   MRN: [id number] Visit Date: 07/16/2019  Today's Provider: Mar Daring, PA-C   Chief Complaint  Patient presents with  . Annual Exam   Subjective:     Complete Physical Diana Blanchard is a 74 y.o. female. She feels well. She reports exercising none. She reports she is sleeping well. -----------------------------------------------------------   Review of Systems  Constitutional: Negative.   HENT: Negative.   Eyes: Negative.   Respiratory: Negative.   Cardiovascular: Positive for palpitations ("sometimes" "not new"off and on for years).  Gastrointestinal: Negative.   Endocrine: Negative.   Genitourinary: Positive for genital sores.  Musculoskeletal: Positive for neck stiffness.  Skin: Negative.   Allergic/Immunologic: Negative.   Neurological: Positive for numbness.  Hematological: Negative.   Psychiatric/Behavioral: Negative.     Social History   Socioeconomic History  . Marital status: Married    Spouse name: Diana Blanchard  . Number of children: 1  . Years of education: business college  . Highest education level: Associate degree: occupational, Hotel manager, or vocational program  Occupational History  . Occupation: Rertired  Tobacco Use  . Smoking status: Former Smoker    Quit date: 06/16/1969    Years since quitting: 50.1  . Smokeless tobacco: Never Used  Substance and Sexual Activity  . Alcohol use: Yes    Alcohol/week: 4.0 - 6.0 standard drinks    Types: 4 - 6 Glasses of wine per week  . Drug use: No  . Sexual activity: Not on file  Other Topics Concern  . Not on file  Social History Narrative  . Not on file   Social Determinants of Health   Financial Resource Strain: Low Risk   . Difficulty of Paying Living Expenses: Not hard at all  Food Insecurity: No Food Insecurity  . Worried About Charity fundraiser in the Last Year: Never true  . Ran Out of Food in the Last Year: Never true    Transportation Needs: No Transportation Needs  . Lack of Transportation (Medical): No  . Lack of Transportation (Non-Medical): No  Physical Activity: Inactive  . Days of Exercise per Week: 0 days  . Minutes of Exercise per Session: 0 min  Stress: Stress Concern Present  . Feeling of Stress : Rather much  Social Connections: Slightly Isolated  . Frequency of Communication with Friends and Family: More than three times a week  . Frequency of Social Gatherings with Friends and Family: More than three times a week  . Attends Religious Services: More than 4 times per year  . Active Member of Clubs or Organizations: No  . Attends Archivist Meetings: Never  . Marital Status: Married  Human resources officer Violence: Not At Risk  . Fear of Current or Ex-Partner: No  . Emotionally Abused: No  . Physically Abused: No  . Sexually Abused: No    Past Medical History:  Diagnosis Date  . Anxiety   . Billowing mitral valve   . Depression   . GERD (gastroesophageal reflux disease)   . Headache   . Hypercholesterolemia      Patient Active Problem List   Diagnosis Date Noted  . Herpes simplex type 2 infection 05/31/2015  . Hypercholesterolemia 05/31/2015  . High potassium 05/31/2015  . Billowing mitral valve 05/31/2015  . Rotator cuff syndrome 05/31/2015  . Acid reflux 09/08/2009  . Clinical depression 12/08/2008  . Low back pain 12/08/2008  . Anxiety 11/07/2008  .  Headache, migraine 11/07/2008    Past Surgical History:  Procedure Laterality Date  . bowel obstruction  1990  . BREAST CYST ASPIRATION Right 1980's  . COLONOSCOPY WITH PROPOFOL N/A 06/29/2015   Procedure: COLONOSCOPY WITH PROPOFOL;  Surgeon: Manya Silvas, MD;  Location: [facility] ENDOSCOPY;  Service: Endoscopy;  Laterality: N/A;  . cyst removed  1981   on the ovary  . LAPAROSCOPY  [id number]   complications from IUD  . ROTATOR CUFF REPAIR Right 06/2011  . TONSILLECTOMY AND ADENOIDECTOMY  1953    Her family history  includes Alzheimer's disease in her mother; Arthritis in her mother; CVA in her father; Diabetes in her father; HIV in her brother; Hyperlipidemia in her mother; Hypertension in her mother. There is no history of Breast cancer.   Current Outpatient Medications:  .  Ascorbic Acid (VITAMIN C) 500 MG CAPS, Take 1 capsule by mouth daily., Disp: , Rfl:  .  Cholecalciferol (VITAMIN D PO), Take 1 tablet by mouth daily., Disp: , Rfl:  .  escitalopram (LEXAPRO) 20 MG tablet, TAKE 1 TABLET BY MOUTH EVERY DAY, Disp: 90 tablet, Rfl: 1 .  Multiple Vitamins-Minerals (WOMENS 50+ MULTI VITAMIN/MIN) TABS, Take by mouth daily., Disp: , Rfl:  .  simvastatin (ZOCOR) 20 MG tablet, TAKE 1 TABLET BY MOUTH EVERY DAY, Disp: 90 tablet, Rfl: 1 .  valACYclovir (VALTREX) 500 MG tablet, TAKE 1 TABLET (500 MG TOTAL) BY MOUTH 2 (TWO) TIMES DAILY. AS NEEDED, Disp: 30 tablet, Rfl: 5  Patient Care Team: Mar Daring, PA-C as PCP - General (Family Medicine) Ralene Bathe, MD (Dermatology) Arelia Sneddon, OD (Optometry)     Objective:    Vitals: BP 112/81 (BP Location: Left Arm, Patient Position: Sitting, Cuff Size: Large)   Pulse 68   Temp (!) 97.1 F (36.2 C) (Temporal)   Resp 16   Ht 5\' 6"  (1.676 m)   Wt 149 lb 9.6 oz (67.9 kg)   BMI 24.15 kg/m   Physical Exam Vitals reviewed.  Constitutional:      General: She is not in acute distress.    Appearance: Normal appearance. She is well-developed and normal weight. She is not ill-appearing or diaphoretic.  HENT:     Head: Normocephalic and atraumatic.     Right Ear: Tympanic membrane, ear canal and external ear normal.     Left Ear: Tympanic membrane, ear canal and external ear normal.  Eyes:     General: No scleral icterus.       Right eye: No discharge.        Left eye: No discharge.     Extraocular Movements: Extraocular movements intact.     Conjunctiva/sclera: Conjunctivae normal.     Pupils: Pupils are equal, round, and reactive to light.    Neck:     Thyroid: No thyromegaly.     Vascular: No JVD.     Trachea: No tracheal deviation.  Cardiovascular:     Rate and Rhythm: Normal rate and regular rhythm.     Pulses: Normal pulses.     Heart sounds: Normal heart sounds. No murmur. No friction rub. No gallop.   Pulmonary:     Effort: Pulmonary effort is normal. No respiratory distress.     Breath sounds: Normal breath sounds. No wheezing or rales.  Chest:     Chest wall: No tenderness.  Abdominal:     General: Abdomen is flat. Bowel sounds are normal. There is no distension.     Palpations: Abdomen is  soft. There is no mass.     Tenderness: There is no abdominal tenderness. There is no guarding or rebound.  Musculoskeletal:        General: No tenderness. Normal range of motion.     Cervical back: Normal range of motion and neck supple.     Right lower leg: No edema.     Left lower leg: No edema.  Lymphadenopathy:     Cervical: No cervical adenopathy.  Skin:    General: Skin is warm and dry.     Capillary Refill: Capillary refill takes less than 2 seconds.     Findings: No rash.  Neurological:     General: No focal deficit present.     Mental Status: She is alert and oriented to person, place, and time. Mental status is at baseline.  Psychiatric:        Mood and Affect: Mood normal.        Behavior: Behavior normal.        Thought Content: Thought content normal.        Judgment: Judgment normal.     Activities of Daily Living In your present state of health, do you have any difficulty performing the following activities: 07/06/2019  Hearing? N  Vision? N  Difficulty concentrating or making decisions? N  Walking or climbing stairs? N  Dressing or bathing? N  Doing errands, shopping? N  Preparing Food and eating ? N  Using the Toilet? N  In the past six months, have you accidently leaked urine? N  Do you have problems with loss of bowel control? N  Managing your Medications? N  Managing your Finances? N   Housekeeping or managing your Housekeeping? N  Some recent data might be hidden    Fall Risk Assessment Fall Risk  07/06/2019 06/30/2018 06/23/2017 06/07/2016 06/02/2015  Falls in the past year? 0 0 No No No  Number falls in past yr: 0 - - - -  Injury with Fall? 0 - - - -     Depression Screen PHQ 2/9 Scores 07/06/2019 07/06/2019 06/30/2018 06/23/2017  PHQ - 2 Score 1 1 2 1   PHQ- 9 Score - - 2 -    6CIT Screen 07/06/2019  What Year? 0 points  What month? 0 points  What time? 0 points  Count back from 20 0 points  Months in reverse 0 points  Repeat phrase 0 points  Total Score 0       Assessment & Plan:    Annual Physical Reviewed patient's Family Medical History Reviewed and updated list of patient's medical providers Assessment of cognitive impairment was done Assessed patient's functional ability Established a written schedule for health screening [place] Completed and Reviewed  Exercise Activities and Dietary recommendations Goals    . Exercise 150 minutes per week (moderate activity)    . Exercise 3x per week (30 min per time)     Recommend to exercise for 3 days a week for at least 30 minutes at a time.        Immunization History  Administered Date(s) Administered  . Fluad Quad(high Dose 65+) 03/17/2019  . Influenza Split 04/03/2009, 03/01/2010, 03/08/2011, 04/06/2012  . Influenza, High Dose Seasonal PF 04/19/2014, 04/05/2015, 04/11/2016, 03/26/2017, 04/08/2018  . Influenza,inj,Quad PF,6+ Mos 04/07/2013  . Pneumococcal Conjugate-13 06/03/2014  . Pneumococcal Polysaccharide-23 06/17/1998, 04/07/2013  . Tdap 04/09/2005, 06/02/2015  . Zoster 01/13/2008  . Zoster Recombinat (Shingrix) 12/02/2017, 04/07/2018    Health Maintenance  Topic Date  Due  . COLONOSCOPY  06/28/2020  . MAMMOGRAM  07/14/2020  . DEXA SCAN  07/22/2022  . TETANUS/TDAP  06/01/2025  . INFLUENZA VACCINE  Completed  . Hepatitis C Screening  Completed  . PNA vac Low  Risk Adult  Completed     Discussed health benefits of physical activity, and encouraged her to engage in regular exercise appropriate for her age and condition.    1. Annual physical exam Normal physical exam today. Will check labs as below and f/u pending lab results. If labs are stable and WNL she will not need to have these rechecked for one year at her next annual physical exam. She is to call the office in the meantime if she has any acute issue, questions or concerns. - CBC with Differential/Platelet - Comprehensive metabolic panel - Lipid panel  2. Anxiety Stable. Diagnosis pulled for medication refill. Continue current medical treatment plan. Will check labs as below and f/u pending results. - CBC with Differential/Platelet - Comprehensive metabolic panel - escitalopram (LEXAPRO) 20 MG tablet; Take 1 tablet (20 mg total) by mouth daily.  Dispense: 90 tablet; Refill: 1  3. Hypercholesterolemia Stable. Diagnosis pulled for medication refill. Continue current medical treatment plan. Will check labs as below and f/u pending results. - CBC with Differential/Platelet - Comprehensive metabolic panel - Lipid panel - simvastatin (ZOCOR) 20 MG tablet; Take 1 tablet (20 mg total) by mouth daily.  Dispense: 90 tablet; Refill: 1  4. Herpes simplex type 2 infection Stable. Diagnosis pulled for medication refill. Continue current medical treatment plan. - valACYclovir (VALTREX) 500 MG tablet; Take 1 tablet (500 mg total) by mouth 2 (two) times daily. As needed  Dispense: 30 tablet; Refill: 5  ------------------------------------------------------------------------------------------------------------    Mar Daring, PA-C  [facility]

## 2019-07-16 ENCOUNTER — Telehealth: Payer: Self-pay

## 2019-07-16 ENCOUNTER — Ambulatory Visit (INDEPENDENT_AMBULATORY_CARE_PROVIDER_SITE_OTHER): Payer: Medicare Other | Admitting: Physician Assistant

## 2019-07-16 ENCOUNTER — Encounter: Payer: Self-pay | Admitting: Physician Assistant

## 2019-07-16 ENCOUNTER — Other Ambulatory Visit: Payer: Self-pay

## 2019-07-16 ENCOUNTER — Ambulatory Visit
Admission: RE | Admit: 2019-07-16 | Discharge: 2019-07-16 | Disposition: A | Discharge status: Home / Self Care | Payer: Medicare Other | Source: Ambulatory Visit | Attending: Physician Assistant | Admitting: Physician Assistant

## 2019-07-16 VITALS — BP 112/81 | HR 68 | Temp 97.1°F | Resp 16 | Ht 66.0 in | Wt 149.6 lb

## 2019-07-16 DIAGNOSIS — E7800 Pure hypercholesterolemia, unspecified: Secondary | ICD-10-CM

## 2019-07-16 DIAGNOSIS — F419 Anxiety disorder, unspecified: Secondary | ICD-10-CM

## 2019-07-16 DIAGNOSIS — Z1231 Encounter for screening mammogram for malignant neoplasm of breast: Secondary | ICD-10-CM | POA: Diagnosis not present

## 2019-07-16 DIAGNOSIS — Z0000 Encounter for general adult medical examination without abnormal findings: Secondary | ICD-10-CM

## 2019-07-16 DIAGNOSIS — B009 Herpesviral infection, unspecified: Secondary | ICD-10-CM

## 2019-07-16 MED ORDER — ESCITALOPRAM OXALATE 20 MG PO TABS: 20.0000 mg | ORAL_TABLET | Freq: Every day | ORAL | 1 refills | Status: DC

## 2019-07-16 MED ORDER — VALACYCLOVIR HCL 500 MG PO TABS: 500.0000 mg | ORAL_TABLET | Freq: Two times a day (BID) | ORAL | 5 refills | Status: DC

## 2019-07-16 MED ORDER — SIMVASTATIN 20 MG PO TABS: 20.0000 mg | ORAL_TABLET | Freq: Every day | ORAL | 1 refills | Status: DC

## 2019-07-16 NOTE — Telephone Encounter (Signed)
-----   Message from Mar Daring, [place] sent at 07/16/2019  4:46 PM EST ----- Normal mammogram. Repeat screening in one year.

## 2019-07-16 NOTE — Telephone Encounter (Signed)
Patient advised as directed below. 

## 2019-07-16 NOTE — Patient Instructions (Addendum)
Health Maintenance After Age 73 After age 73, you are at a higher risk for certain long-term diseases and infections as well as injuries from falls. Falls are a major cause of broken bones and head injuries in people who are older than age 73. Getting regular preventive care can help to keep you healthy and well. Preventive care includes getting regular testing and making lifestyle changes as recommended by your health care provider. Talk with your health care provider about:  Which screenings and tests you should have. A screening is a test that checks for a disease when you have no symptoms.  A diet and exercise plan that is right for you. What should I know about screenings and tests to prevent falls? Screening and testing are the best ways to find a health problem early. Early diagnosis and treatment give you the best chance of managing medical conditions that are common after age 73. Certain conditions and lifestyle choices may make you more likely to have a fall. Your health care provider may recommend:  Regular vision checks. Poor vision and conditions such as cataracts can make you more likely to have a fall. If you wear glasses, make sure to get your prescription updated if your vision changes.  Medicine review. Work with your health care provider to regularly review all of the medicines you are taking, including over-the-counter medicines. Ask your health care provider about any side effects that may make you more likely to have a fall. Tell your health care provider if any medicines that you take make you feel dizzy or sleepy.  Osteoporosis screening. Osteoporosis is a condition that causes the bones to get weaker. This can make the bones weak and cause them to break more easily.  Blood pressure screening. Blood pressure changes and medicines to control blood pressure can make you feel dizzy.  Strength and balance checks. Your health care provider may recommend certain tests to check your  strength and balance while standing, walking, or changing positions.  Foot health exam. Foot pain and numbness, as well as not wearing proper footwear, can make you more likely to have a fall.  Depression screening. You may be more likely to have a fall if you have a fear of falling, feel emotionally low, or feel unable to do activities that you used to do.  Alcohol use screening. Using too much alcohol can affect your balance and may make you more likely to have a fall. What actions can I take to lower my risk of falls? General instructions  Talk with your health care provider about your risks for falling. Tell your health care provider if: ? You fall. Be sure to tell your health care provider about all falls, even ones that seem minor. ? You feel dizzy, sleepy, or off-balance.  Take over-the-counter and prescription medicines only as told by your health care provider. These include any supplements.  Eat a healthy diet and maintain a healthy weight. A healthy diet includes low-fat dairy products, low-fat (lean) meats, and fiber from whole grains, beans, and lots of fruits and vegetables. Home safety  Remove any tripping hazards, such as rugs, cords, and clutter.  Install safety equipment such as grab bars in bathrooms and safety rails on stairs.  Keep rooms and walkways well-lit. Activity   Follow a regular exercise program to stay fit. This will help you maintain your balance. Ask your health care provider what types of exercise are appropriate for you.  If you need a cane or   walker, use it as recommended by your health care provider.  Wear supportive shoes that have nonskid soles. Lifestyle  Do not drink alcohol if your health care provider tells you not to drink.  If you drink alcohol, limit how much you have: ? 0-1 drink a day for women. ? 0-2 drinks a day for men.  Be aware of how much alcohol is in your drink. In the [place] one drink equals one typical bottle of beer (12  oz), one-half glass of wine (5 oz), or one shot of hard liquor (1 oz).  Do not use any products that contain nicotine or tobacco, such as cigarettes and e-cigarettes. If you need help quitting, ask your health care provider. Summary  Having a healthy lifestyle and getting preventive care can help to protect your health and wellness after age 84.  Screening and testing are the best way to find a health problem early and help you avoid having a fall. Early diagnosis and treatment give you the best chance for managing medical conditions that are more common for people who are older than age 30.  Falls are a major cause of broken bones and head injuries in people who are older than age 84. Take precautions to prevent a fall at home.  Work with your health care provider to learn what changes you can make to improve your health and wellness and to prevent falls. This information is not intended to replace advice given to you by your health care provider. Make sure you discuss any questions you have with your health care provider. Document Revised: 09/24/2018 Document Reviewed: 04/16/2017 Elsevier Patient Education  [place].  Can take to lessen severity for covid 19: Vit C 500mg  twice daily Quercertin 250-500mg  twice daily Zinc 75-100mg  daily Melatonin 3-6 mg at bedtime Vit D3 1000-2000 IU daily Aspirin 81 mg daily with food Optional: Famotidine 20mg  daily Also can add tylenol/ibuprofen as needed for fevers and body aches May add Mucinex or Mucinex DM as needed for cough/congestion

## 2019-07-28 DIAGNOSIS — Z0000 Encounter for general adult medical examination without abnormal findings: Secondary | ICD-10-CM | POA: Diagnosis not present

## 2019-07-28 DIAGNOSIS — F419 Anxiety disorder, unspecified: Secondary | ICD-10-CM | POA: Diagnosis not present

## 2019-07-28 DIAGNOSIS — E7800 Pure hypercholesterolemia, unspecified: Secondary | ICD-10-CM | POA: Diagnosis not present

## 2019-07-29 LAB — CBC WITH DIFFERENTIAL/PLATELET
Basophils Absolute: 0.1 10*3/uL (ref 0.0–0.2)
Basos: 1 %
EOS (ABSOLUTE): 0.2 10*3/uL (ref 0.0–0.4)
Eos: 5 %
Hematocrit: 39 % (ref 34.0–46.6)
Hemoglobin: 13.3 g/dL (ref 11.1–15.9)
Immature Grans (Abs): 0 10*3/uL (ref 0.0–0.1)
Immature Granulocytes: 0 %
Lymphocytes Absolute: 2 10*3/uL (ref 0.7–3.1)
Lymphs: 41 %
MCH: 32.9 pg (ref 26.6–33.0)
MCHC: 34.1 g/dL (ref 31.5–35.7)
MCV: 97 fL (ref 79–97)
Monocytes Absolute: 0.5 10*3/uL (ref 0.1–0.9)
Monocytes: 9 %
Neutrophils Absolute: 2.2 10*3/uL (ref 1.4–7.0)
Neutrophils: 44 %
Platelets: 270 10*3/uL (ref 150–450)
RBC: 4.04 x10E6/uL (ref 3.77–5.28)
RDW: 12 % (ref 11.7–15.4)
WBC: 4.9 10*3/uL (ref 3.4–10.8)

## 2019-07-29 LAB — COMPREHENSIVE METABOLIC PANEL
ALT: 8 IU/L (ref 0–32)
AST: 20 IU/L (ref 0–40)
Albumin/Globulin Ratio: 2 (ref 1.2–2.2)
Albumin: 4.3 g/dL (ref 3.7–4.7)
Alkaline Phosphatase: 78 IU/L (ref 39–117)
BUN/Creatinine Ratio: 13 (ref 12–28)
BUN: 14 mg/dL (ref 8–27)
Bilirubin Total: 0.6 mg/dL (ref 0.0–1.2)
CO2: 24 mmol/L (ref 20–29)
Calcium: 9.7 mg/dL (ref 8.7–10.3)
Chloride: 107 mmol/L — ABNORMAL HIGH (ref 96–106)
Creatinine, Ser: 1.04 mg/dL — ABNORMAL HIGH (ref 0.57–1.00)
GFR calc Af Amer: 62 mL/min/{1.73_m2} (ref >59)
GFR calc non Af Amer: 54 mL/min/{1.73_m2} — ABNORMAL LOW (ref >59)
Globulin, Total: 2.1 g/dL (ref 1.5–4.5)
Glucose: 91 mg/dL (ref 65–99)
Potassium: 4.6 mmol/L (ref 3.5–5.2)
Sodium: 145 mmol/L — ABNORMAL HIGH (ref 134–144)
Total Protein: 6.4 g/dL (ref 6.0–8.5)

## 2019-07-29 LAB — LIPID PANEL
Chol/HDL Ratio: 3 ratio (ref 0.0–4.4)
Cholesterol, Total: 221 mg/dL — ABNORMAL HIGH (ref 100–199)
HDL: 74 mg/dL (ref >39)
LDL Chol Calc (NIH): 131 mg/dL — ABNORMAL HIGH (ref 0–99)
Triglycerides: 90 mg/dL (ref 0–149)
VLDL Cholesterol Cal: 16 mg/dL (ref 5–40)

## 2019-08-01 ENCOUNTER — Other Ambulatory Visit: Payer: Self-pay | Admitting: Physician Assistant

## 2019-08-01 DIAGNOSIS — F419 Anxiety disorder, unspecified: Secondary | ICD-10-CM

## 2019-08-01 DIAGNOSIS — E7800 Pure hypercholesterolemia, unspecified: Secondary | ICD-10-CM

## 2019-09-27 ENCOUNTER — Encounter: Payer: Self-pay | Admitting: Dermatology

## 2019-09-27 ENCOUNTER — Ambulatory Visit: Payer: Medicare Other | Admitting: Dermatology

## 2019-09-27 ENCOUNTER — Other Ambulatory Visit: Payer: Self-pay

## 2019-09-27 DIAGNOSIS — L821 Other seborrheic keratosis: Secondary | ICD-10-CM | POA: Diagnosis not present

## 2019-09-27 DIAGNOSIS — L820 Inflamed seborrheic keratosis: Secondary | ICD-10-CM | POA: Diagnosis not present

## 2019-09-27 DIAGNOSIS — D485 Neoplasm of uncertain behavior of skin: Secondary | ICD-10-CM | POA: Diagnosis not present

## 2019-09-27 DIAGNOSIS — Z86018 Personal history of other benign neoplasm: Secondary | ICD-10-CM

## 2019-09-27 DIAGNOSIS — Z1283 Encounter for screening for malignant neoplasm of skin: Secondary | ICD-10-CM | POA: Diagnosis not present

## 2019-09-27 DIAGNOSIS — D229 Melanocytic nevi, unspecified: Secondary | ICD-10-CM | POA: Diagnosis not present

## 2019-09-27 DIAGNOSIS — D2239 Melanocytic nevi of other parts of face: Secondary | ICD-10-CM

## 2019-09-27 DIAGNOSIS — L578 Other skin changes due to chronic exposure to nonionizing radiation: Secondary | ICD-10-CM

## 2019-09-27 DIAGNOSIS — D1800 Hemangioma unspecified site: Secondary | ICD-10-CM

## 2019-09-27 DIAGNOSIS — L814 Other melanin hyperpigmentation: Secondary | ICD-10-CM

## 2019-09-27 DIAGNOSIS — I8310 Varicose veins of unspecified lower extremity with inflammation: Secondary | ICD-10-CM

## 2019-09-27 NOTE — Progress Notes (Signed)
Follow-Up Visit   Subjective  Diana Blanchard is a 74 y.o. female who presents for the following: Annual Exam (Patient has noticed a few lesions on her R arm, neck, and back that are irritated. ). The patient presents for skin cancer screening and total-body skin exam.  The following portions of the chart were reviewed this encounter and updated as appropriate: Tobacco  Allergies  Meds  Problems  Med Hx  Surg Hx  Fam Hx     Review of Systems: No other skin or systemic complaints.  Objective  Well appearing patient in no apparent distress; mood and affect are within normal limits.  A full examination was performed including scalp, head, eyes, ears, nose, lips, neck, chest, axillae, abdomen, back, buttocks, bilateral upper extremities, bilateral lower extremities, hands, feet, fingers, toes, fingernails, and toenails. All findings within normal limits unless otherwise noted below.  Objective  R lat buttocks, R back at braline, R back paraspinal med to inf scapula: Scar with no evidence of recurrence.   Objective  Trunk, arms, neck, and face x 16 (16): Erythematous keratotic or waxy stuck-on papule or plaque.   Objective  R post shoulder lat sup scapula: 0.5 cm irregular brown macule  Objective  L nasal tip: 0.4 cm flesh colored papule   Assessment & Plan    History of dysplastic nevus R lat buttocks, R back at braline, R back paraspinal med to inf scapula  Clear. Observe for recurrence. Call clinic for new or changing lesions.  Recommend regular skin exams, daily broad-spectrum spf 30+ sunscreen use, and photoprotection.     Inflamed seborrheic keratosis (16) Trunk, arms, neck, and face x 16  Destruction of lesion - Trunk, arms, neck, and face x 16 Complexity: simple   Destruction method: cryotherapy   Informed consent: discussed and consent obtained   Timeout:  patient name, date of birth, surgical site, and procedure verified Lesion destroyed using liquid  nitrogen: Yes   Region frozen until ice ball extended beyond lesion: Yes   Outcome: patient tolerated procedure well with no complications   Post-procedure details: wound care instructions given    Neoplasm of uncertain behavior of skin R post shoulder lat sup scapula  Epidermal / dermal shaving  Lesion length (cm):  0.5 Lesion width (cm):  0.5 Margin per side (cm):  0.2 Total excision diameter (cm):  0.9 Informed consent: discussed and consent obtained   Timeout: patient name, date of birth, surgical site, and procedure verified   Procedure prep:  Patient was prepped and draped in usual sterile fashion Prep type:  Isopropyl alcohol Anesthesia: the lesion was anesthetized in a standard fashion   Anesthetic:  1% lidocaine w/ epinephrine 1-100,000 buffered w/ 8.4% NaHCO3 Instrument used: flexible razor blade   Hemostasis achieved with: pressure, aluminum chloride and electrodesiccation   Outcome: patient tolerated procedure well   Post-procedure details: sterile dressing applied and wound care instructions given   Dressing type: bandage and petrolatum    Specimen 1 - Surgical pathology Differential Diagnosis: D48.5 r/o dysplastic nevus Check Margins: No 0.5 cm irregular brown macule  Nevus L nasal tip  Benign, observe. Watch for change.   Seborrheic Keratoses - Stuck-on, waxy, tan-brown papules and plaques  - Discussed benign etiology and prognosis. - Observe - Call for any changes  Actinic Damage - diffuse scaly erythematous macules with underlying dyspigmentation - Recommend daily broad spectrum sunscreen SPF 30+ to sun-exposed areas, reapply every 2 hours as needed.  - Call for new or changing  lesions.  Hemangiomas - Red papules - Discussed benign nature - Observe - Call for any changes  Melanocytic Nevi - Tan-brown and/or pink-flesh-colored symmetric macules and papules - Benign appearing on exam today - Observation - Call clinic for new or changing  moles - Recommend daily use of broad spectrum spf 30+ sunscreen to sun-exposed areas.   Lentigines - Scattered tan macules - Discussed due to sun exposure - Benign, observe - Call for any changes    Varicose Veins - Dilated blue, purple or red veins at the lower extremities - Reassured - These can be treated by sclerotherapy (a procedure to inject a medicine into the veins to make them disappear) if desired, but the treatment is not covered by insurance    Return in about 1 year (around 09/26/2020) for TBSE.   Luther Redo, CMA, am acting as scribe for Sarina Ser, MD . Documentation: I have reviewed the above documentation for accuracy and completeness, and I agree with the above.  Sarina Ser, MD

## 2019-09-27 NOTE — Patient Instructions (Signed)

## 2019-10-01 ENCOUNTER — Telehealth: Payer: Self-pay

## 2019-10-01 NOTE — Telephone Encounter (Signed)
-----   Message from Ralene Bathe, MD sent at 09/28/2019  5:33 PM EDT ----- Skin , right post shoulder lat sup scapula SEBORRHEIC KERATOSIS, INFLAMED  Benign keratosis No further treatment needed

## 2019-10-01 NOTE — Telephone Encounter (Signed)
Lft pt msg advising bx benign and to call the clinic if she has any questions./sh

## 2020-01-13 ENCOUNTER — Other Ambulatory Visit: Payer: Self-pay

## 2020-01-13 ENCOUNTER — Encounter: Payer: Self-pay | Admitting: Physician Assistant

## 2020-01-13 ENCOUNTER — Ambulatory Visit (INDEPENDENT_AMBULATORY_CARE_PROVIDER_SITE_OTHER): Payer: Medicare Other | Admitting: Physician Assistant

## 2020-01-13 VITALS — BP 109/73 | HR 56 | Temp 98.3°F | Resp 16 | Ht 66.0 in | Wt 145.0 lb

## 2020-01-13 DIAGNOSIS — F331 Major depressive disorder, recurrent, moderate: Secondary | ICD-10-CM

## 2020-01-13 MED ORDER — BUPROPION HCL ER (XL) 150 MG PO TB24: 150.0000 mg | ORAL_TABLET | Freq: Every day | ORAL | 1 refills | Status: DC

## 2020-01-13 NOTE — Patient Instructions (Signed)

## 2020-01-13 NOTE — Progress Notes (Signed)
Established patient visit   Patient: Diana Blanchard   DOB: Sep 17, 1945   74 y.o. Female  MRN: [id number] Visit Date: 01/13/2020  Today's healthcare provider: Mar Daring, PA-C   Chief Complaint  Patient presents with  . Depression   Subjective    HPI  Patient here with history of Depression. She is currently on Lexapro 20mg  which she has been on it for while. She feels her symptoms are worsening. She cries a lot. She has a lot on her "plate". She wants to know if her medicine can be changed or if she can go see a Social worker.  Patient Active Problem List   Diagnosis Date Noted  . Herpes simplex type 2 infection 05/31/2015  . Hypercholesterolemia 05/31/2015  . High potassium 05/31/2015  . Billowing mitral valve 05/31/2015  . Rotator cuff syndrome 05/31/2015  . Acid reflux 09/08/2009  . Clinical depression 12/08/2008  . Low back pain 12/08/2008  . Anxiety 11/07/2008  . Headache, migraine 11/07/2008   Past Medical History:  Diagnosis Date  . Anxiety   . Atypical mole   . Billowing mitral valve   . Depression   . GERD (gastroesophageal reflux disease)   . Headache   . Hypercholesterolemia        Medications: Outpatient Medications Prior to Visit  Medication Sig  . Ascorbic Acid (VITAMIN C) 500 MG CAPS Take 1 capsule by mouth daily.  . Cholecalciferol (VITAMIN D PO) Take 1 tablet by mouth daily.  Marland Kitchen escitalopram (LEXAPRO) 20 MG tablet TAKE 1 TABLET BY MOUTH EVERY DAY  . Multiple Vitamins-Minerals (WOMENS 50+ MULTI VITAMIN/MIN) TABS Take by mouth daily.  . simvastatin (ZOCOR) 20 MG tablet TAKE 1 TABLET BY MOUTH EVERY DAY  . valACYclovir (VALTREX) 500 MG tablet Take 1 tablet (500 mg total) by mouth 2 (two) times daily. As needed   No facility-administered medications prior to visit.    Review of Systems  Constitutional: Negative.   Respiratory: Negative for cough, chest tightness, shortness of breath and wheezing.   Cardiovascular: Negative for chest pain,  palpitations and leg swelling.  Psychiatric/Behavioral: Positive for agitation, decreased concentration, dysphoric mood and sleep disturbance. The patient is nervous/anxious.     Last CBC Lab Results  Component Value Date   WBC 4.9 07/28/2019   HGB 13.3 07/28/2019   HCT 39.0 07/28/2019   MCV 97 07/28/2019   MCH 32.9 07/28/2019   RDW 12.0 07/28/2019   PLT 270 14/78/2956   Last metabolic panel Lab Results  Component Value Date   GLUCOSE 91 07/28/2019   NA 145 (H) 07/28/2019   K 4.6 07/28/2019   CL 107 (H) 07/28/2019   CO2 24 07/28/2019   BUN 14 07/28/2019   CREATININE 1.04 (H) 07/28/2019   GFRNONAA 54 (L) 07/28/2019   GFRAA 62 07/28/2019   CALCIUM 9.7 07/28/2019   PROT 6.4 07/28/2019   ALBUMIN 4.3 07/28/2019   LABGLOB 2.1 07/28/2019   AGRATIO 2.0 07/28/2019   BILITOT 0.6 07/28/2019   ALKPHOS 78 07/28/2019   AST 20 07/28/2019   ALT 8 07/28/2019      Objective    BP 109/73 (BP Location: Left Arm, Patient Position: Sitting, Cuff Size: Normal)   Pulse 56   Temp 98.3 F (36.8 C) (Oral)   Resp 16   Ht 5\' 6"  (1.676 m)   Wt 145 lb (65.8 kg)   BMI 23.40 kg/m  BP Readings from Last 3 Encounters:  01/13/20 109/73  07/16/19 112/81  07/08/18 120/79  Wt Readings from Last 3 Encounters:  01/13/20 145 lb (65.8 kg)  07/16/19 149 lb 9.6 oz (67.9 kg)  07/08/18 151 lb (68.5 kg)      Physical Exam Constitutional:      General: She is not in acute distress.    Appearance: Normal appearance. She is well-developed and normal weight. She is not ill-appearing or diaphoretic.  Cardiovascular:     Rate and Rhythm: Normal rate and regular rhythm.     Heart sounds: Normal heart sounds. No murmur heard.  No friction rub. No gallop.   Pulmonary:     Effort: Pulmonary effort is normal.     Breath sounds: No wheezing or rales.  Chest:     Chest wall: No tenderness.  Neurological:     Mental Status: She is alert and oriented to person, place, and time.  Psychiatric:         Attention and Perception: Attention and perception normal.        Mood and Affect: Mood is depressed.        Speech: Speech normal.        Behavior: Behavior normal. Behavior is cooperative.        Thought Content: Thought content normal.        Judgment: Judgment normal.      Depression screen [facility] 2/9 01/13/2020 07/06/2019 07/06/2019 06/30/2018 06/23/2017  Decreased Interest 1 0 0 1 0  Down, Depressed, Hopeless 2 1 1 1 1   PHQ - 2 Score 3 1 1 2 1   Altered sleeping 0 - - 0 -  Tired, decreased energy 1 - - 0 -  Change in appetite 0 - - 0 -  Feeling bad or failure about yourself  1 - - 0 -  Trouble concentrating 0 - - 0 -  Moving slowly or fidgety/restless 0 - - 0 -  Suicidal thoughts 0 - - 0 -  PHQ-9 Score 5 - - 2 -  Difficult doing work/chores - - - Not difficult at all -   GAD 7 : Generalized Anxiety Score 01/13/2020  Nervous, Anxious, on Edge 1  Control/stop worrying 1  Worry too much - different things 1  Trouble relaxing 0  Restless 0  Easily annoyed or irritable 1  Afraid - awful might happen 0  Total GAD 7 Score 4  Anxiety Difficulty Somewhat difficult    No results found for any visits on 01/13/20.  Assessment & Plan     1. Moderate episode of recurrent major depressive disorder (HCC) Worsening situationally. Continue escitalopram 20mg  at bedtime. Add wellbutrin 150mg  daily in the morning. F/U in 4-6 weeks. - buPROPion (WELLBUTRIN XL) 150 MG 24 hr tablet; Take 1 tablet (150 mg total) by mouth daily.  Dispense: 30 tablet; Refill: 1 - Ambulatory referral to Psychology   No follow-ups on file.      Reynolds Bowl, PA-C, have reviewed all documentation for this visit. The documentation on 01/18/20 for the exam, diagnosis, procedures, and orders are all accurate and complete.   Rubye Beach  [facility] [phone] (phone) [phone] (fax)  [place]

## 2020-01-17 ENCOUNTER — Other Ambulatory Visit: Payer: Self-pay | Admitting: Physician Assistant

## 2020-01-17 DIAGNOSIS — F419 Anxiety disorder, unspecified: Secondary | ICD-10-CM

## 2020-01-17 DIAGNOSIS — E7800 Pure hypercholesterolemia, unspecified: Secondary | ICD-10-CM

## 2020-02-14 ENCOUNTER — Ambulatory Visit: Payer: Medicare Other | Admitting: Physician Assistant

## 2020-02-14 ENCOUNTER — Other Ambulatory Visit: Payer: Self-pay

## 2020-02-14 DIAGNOSIS — F331 Major depressive disorder, recurrent, moderate: Secondary | ICD-10-CM

## 2020-02-14 MED ORDER — BUPROPION HCL ER (XL) 150 MG PO TB24: 150.0000 mg | ORAL_TABLET | Freq: Every day | ORAL | 1 refills | Status: DC

## 2020-02-14 NOTE — Progress Notes (Signed)
Established patient visit  I,Elena D DeSanto,acting as a scribe for Mar Daring, PA-C.,have documented all relevant documentation on the behalf of Mar Daring, PA-C,as directed by  Mar Daring, PA-C while in the presence of Mar Daring, PA-C.   Patient: Diana Blanchard   DOB: Dec 28, 1945   74 y.o. Female  MRN: [id number] Visit Date: 02/14/2020  Today's healthcare provider: Mar Daring, PA-C   No chief complaint on file.  Subjective    HPI  Follow up for depression/anxiety  The patient was last seen for this 4-6 weeks ago. Changes made at last visit include Continue escitalopram 20mg  at bedtime. Add wellbutrin 150mg  daily in the morning.  .  She reports good compliance with treatment. She feels that condition is Improved. She is not having side effects.  She complains of no symptoms -----------------------------------------------------------------------------------------   Patient Active Problem List   Diagnosis Date Noted  . Herpes simplex type 2 infection 05/31/2015  . Hypercholesterolemia 05/31/2015  . High potassium 05/31/2015  . Billowing mitral valve 05/31/2015  . Rotator cuff syndrome 05/31/2015  . Acid reflux 09/08/2009  . Clinical depression 12/08/2008  . Low back pain 12/08/2008  . Anxiety 11/07/2008  . Headache, migraine 11/07/2008   Past Medical History:  Diagnosis Date  . Anxiety   . Atypical mole   . Billowing mitral valve   . Depression   . GERD (gastroesophageal reflux disease)   . Headache   . Hypercholesterolemia        Medications: Outpatient Medications Prior to Visit  Medication Sig  . escitalopram (LEXAPRO) 20 MG tablet TAKE 1 TABLET(20 MG) BY MOUTH DAILY  . Multiple Vitamins-Minerals (WOMENS 50+ MULTI VITAMIN/MIN) TABS Take by mouth daily.  . simvastatin (ZOCOR) 20 MG tablet TAKE 1 TABLET(20 MG) BY MOUTH DAILY  . valACYclovir (VALTREX) 500 MG tablet Take 1 tablet (500 mg total) by mouth 2 (two)  times daily. As needed  . [DISCONTINUED] buPROPion (WELLBUTRIN XL) 150 MG 24 hr tablet Take 1 tablet (150 mg total) by mouth daily.  . [DISCONTINUED] Ascorbic Acid (VITAMIN C) 500 MG CAPS Take 1 capsule by mouth daily.  . [DISCONTINUED] Cholecalciferol (VITAMIN D PO) Take 1 tablet by mouth daily.   No facility-administered medications prior to visit.    Review of Systems  Constitutional: Negative.   Respiratory: Negative.   Cardiovascular: Negative.   Neurological: Negative.   Psychiatric/Behavioral: Negative.  Negative for dysphoric mood and sleep disturbance. The patient is not nervous/anxious.     Last CBC Lab Results  Component Value Date   WBC 4.9 07/28/2019   HGB 13.3 07/28/2019   HCT 39.0 07/28/2019   MCV 97 07/28/2019   MCH 32.9 07/28/2019   RDW 12.0 07/28/2019   PLT 270 85/07/7739   Last metabolic panel Lab Results  Component Value Date   GLUCOSE 91 07/28/2019   NA 145 (H) 07/28/2019   K 4.6 07/28/2019   CL 107 (H) 07/28/2019   CO2 24 07/28/2019   BUN 14 07/28/2019   CREATININE 1.04 (H) 07/28/2019   GFRNONAA 54 (L) 07/28/2019   GFRAA 62 07/28/2019   CALCIUM 9.7 07/28/2019   PROT 6.4 07/28/2019   ALBUMIN 4.3 07/28/2019   LABGLOB 2.1 07/28/2019   AGRATIO 2.0 07/28/2019   BILITOT 0.6 07/28/2019   ALKPHOS 78 07/28/2019   AST 20 07/28/2019   ALT 8 07/28/2019      Objective    BP 138/78 (BP Location: Left Arm, Patient Position: Sitting, Cuff  Size: Normal)   Pulse (!) 59   Temp 98.6 F (37 C) (Oral)   Wt 146 lb (66.2 kg)   SpO2 98%   BMI 23.57 kg/m  BP Readings from Last 3 Encounters:  02/14/20 138/78  01/13/20 109/73  07/16/19 112/81   Wt Readings from Last 3 Encounters:  02/14/20 146 lb (66.2 kg)  01/13/20 145 lb (65.8 kg)  07/16/19 149 lb 9.6 oz (67.9 kg)      Physical Exam Vitals reviewed.  Constitutional:      General: She is not in acute distress.    Appearance: Normal appearance. She is well-developed and well-groomed. She is not  ill-appearing or diaphoretic.  Cardiovascular:     Rate and Rhythm: Normal rate and regular rhythm.     Pulses: Normal pulses.     Heart sounds: Normal heart sounds. No murmur heard.  No friction rub. No gallop.   Pulmonary:     Effort: Pulmonary effort is normal. No respiratory distress.     Breath sounds: Normal breath sounds. No wheezing or rales.  Musculoskeletal:     Cervical back: Normal range of motion and neck supple.  Neurological:     Mental Status: She is alert.  Psychiatric:        Mood and Affect: Mood normal.        Behavior: Behavior normal. Behavior is cooperative.        Thought Content: Thought content normal.       No results found for any visits on 02/14/20.  Assessment & Plan     1. Moderate episode of recurrent major depressive disorder (HCC) Improved. Continue Wellbutrin XL 150mg  daily. F/U when next CPE is due, Jan 2022. - buPROPion (WELLBUTRIN XL) 150 MG 24 hr tablet; Take 1 tablet (150 mg total) by mouth daily.  Dispense: 90 tablet; Refill: 1   No follow-ups on file.      Reynolds Bowl, PA-C, have reviewed all documentation for this visit. The documentation on 02/23/20 for the exam, diagnosis, procedures, and orders are all accurate and complete.   Rubye Beach  [facility] [phone] (phone) [phone] (fax)  [place]

## 2020-02-23 ENCOUNTER — Encounter: Payer: Self-pay | Admitting: Physician Assistant

## 2020-03-22 DIAGNOSIS — F411 Generalized anxiety disorder: Secondary | ICD-10-CM | POA: Diagnosis not present

## 2020-03-22 DIAGNOSIS — F3341 Major depressive disorder, recurrent, in partial remission: Secondary | ICD-10-CM | POA: Diagnosis not present

## 2020-03-23 ENCOUNTER — Other Ambulatory Visit: Payer: Self-pay

## 2020-03-23 ENCOUNTER — Ambulatory Visit (INDEPENDENT_AMBULATORY_CARE_PROVIDER_SITE_OTHER): Payer: Medicare Other

## 2020-03-23 DIAGNOSIS — Z23 Encounter for immunization: Secondary | ICD-10-CM | POA: Diagnosis not present

## 2020-04-16 ENCOUNTER — Other Ambulatory Visit: Payer: Self-pay | Admitting: Physician Assistant

## 2020-04-16 DIAGNOSIS — E7800 Pure hypercholesterolemia, unspecified: Secondary | ICD-10-CM

## 2020-04-16 NOTE — Telephone Encounter (Signed)
Requested Prescriptions  Pending Prescriptions Disp Refills  . simvastatin (ZOCOR) 20 MG tablet [Pharmacy Med Name: SIMVASTATIN 20MG  TABLETS] 90 tablet 0    Sig: TAKE 1 TABLET(20 MG) BY MOUTH DAILY     Cardiovascular:  Antilipid - Statins Failed - 04/16/2020  7:52 AM      Failed - Total Cholesterol in normal range and within 360 days    Cholesterol, Total  Date Value Ref Range Status  07/28/2019 221 (H) 100 - 199 mg/dL Final         Failed - LDL in normal range and within 360 days    LDL Chol Calc (NIH)  Date Value Ref Range Status  07/28/2019 131 (H) 0 - 99 mg/dL Final         Passed - HDL in normal range and within 360 days    HDL  Date Value Ref Range Status  07/28/2019 74 >39 mg/dL Final         Passed - Triglycerides in normal range and within 360 days    Triglycerides  Date Value Ref Range Status  07/28/2019 90 0 - 149 mg/dL Final         Passed - Patient is not pregnant      Passed - Valid encounter within last 12 months    Recent Outpatient Visits          2 months ago Moderate episode of recurrent major depressive disorder [facility])   [place], [place], PA-C   3 months ago Moderate episode of recurrent major depressive disorder [facility])   [facility] [place], [place], [place]   9 months ago Annual physical exam   [place], [place], [place]   1 year ago Annual physical exam   [facility] [place], [place], [place]   2 years ago Medicare annual wellness visit, subsequent   [place], [place], [place]

## 2020-04-20 ENCOUNTER — Other Ambulatory Visit: Payer: Self-pay

## 2020-04-20 ENCOUNTER — Ambulatory Visit (INDEPENDENT_AMBULATORY_CARE_PROVIDER_SITE_OTHER): Payer: Medicare Other

## 2020-04-20 DIAGNOSIS — Z23 Encounter for immunization: Secondary | ICD-10-CM

## 2020-05-01 ENCOUNTER — Other Ambulatory Visit: Payer: Self-pay

## 2020-05-01 ENCOUNTER — Encounter: Payer: Self-pay | Admitting: Family Medicine

## 2020-05-01 ENCOUNTER — Ambulatory Visit: Payer: Medicare Other | Admitting: Family Medicine

## 2020-05-01 VITALS — BP 117/89 | HR 62 | Temp 98.4°F | Wt 150.0 lb

## 2020-05-01 DIAGNOSIS — I83813 Varicose veins of bilateral lower extremities with pain: Secondary | ICD-10-CM | POA: Diagnosis not present

## 2020-05-01 NOTE — Progress Notes (Signed)
Acute Office Visit  Subjective:    Patient ID: Diana Blanchard, female    DOB: 08/15/1945, 74 y.o.   MRN: [id number]  No chief complaint on file.   HPI Patient is in today for evaluation of leg pain from varicose veins.  She has a history of treatment through [facility] greater than 3 years ano and would like a referral to go back to see them. She has for the last couple of months been having trouble with her right upper thigh especially.  She had been helping with early elections and on her feet a lot.  Since that time it has gotten worse.   She would like a referral back to [facility].  Past Medical History:  Diagnosis Date   Anxiety    Atypical mole    Billowing mitral valve    Depression    GERD (gastroesophageal reflux disease)    Headache    Hypercholesterolemia     Past Surgical History:  Procedure Laterality Date   bowel obstruction  1990   BREAST CYST ASPIRATION Right 1980's   COLONOSCOPY WITH PROPOFOL N/A 06/29/2015   Procedure: COLONOSCOPY WITH PROPOFOL;  Surgeon: Manya Silvas, MD;  Location: [facility] ENDOSCOPY;  Service: Endoscopy;  Laterality: N/A;   cyst removed  1981   on the ovary   LAPAROSCOPY  3762   complications from IUD   ROTATOR CUFF REPAIR Right 06/2011   TONSILLECTOMY AND ADENOIDECTOMY  1953    Family History  Problem Relation Age of Onset   Arthritis Mother    Hyperlipidemia Mother    Hypertension Mother    Alzheimer's disease Mother    Diabetes Father    CVA Father    HIV Brother    Breast cancer Neg Hx     Social History   Socioeconomic History   Marital status: Married    Spouse name: Merry Proud   Number of children: 1   Years of education: business college   Highest education level: Associate degree: occupational, Hotel manager, or vocational program  Occupational History   Occupation: Rertired  Tobacco Use   Smoking status: Former Smoker    Quit date: 06/16/1969    Years since quitting: 50.9    Smokeless tobacco: Never Used  Vaping Use   Vaping Use: Never used  Substance and Sexual Activity   Alcohol use: Yes    Alcohol/week: 4.0 - 6.0 standard drinks    Types: 4 - 6 Glasses of wine per week   Drug use: No   Sexual activity: Not on file  Other Topics Concern   Not on file  Social History Narrative   Not on file   Social Determinants of Health   Financial Resource Strain: Low Risk    Difficulty of Paying Living Expenses: Not hard at all  Food Insecurity: No Food Insecurity   Worried About Charity fundraiser in the Last Year: Never true   Ran Out of Food in the Last Year: Never true  Transportation Needs: No Transportation Needs   Lack of Transportation (Medical): No   Lack of Transportation (Non-Medical): No  Physical Activity: Inactive   Days of Exercise per Week: 0 days   Minutes of Exercise per Session: 0 min  Stress: Stress Concern Present   Feeling of Stress : Rather much  Social Connections: Moderately Integrated   Frequency of Communication with Friends and Family: More than three times a week   Frequency of Social Gatherings with Friends and Family: More than  three times a week   Attends Religious Services: More than 4 times per year   Active Member of Clubs or Organizations: No   Attends Archivist Meetings: Never   Marital Status: Married  Human resources officer Violence: Not At Risk   Fear of Current or Ex-Partner: No   Emotionally Abused: No   Physically Abused: No   Sexually Abused: No    Outpatient Medications Prior to Visit  Medication Sig Dispense Refill   buPROPion (WELLBUTRIN XL) 150 MG 24 hr tablet Take 1 tablet (150 mg total) by mouth daily. 90 tablet 1   escitalopram (LEXAPRO) 20 MG tablet TAKE 1 TABLET(20 MG) BY MOUTH DAILY 90 tablet 1   Multiple Vitamins-Minerals (WOMENS 50+ MULTI VITAMIN/MIN) TABS Take by mouth daily.     simvastatin (ZOCOR) 20 MG tablet TAKE 1 TABLET(20 MG) BY MOUTH DAILY 90 tablet 0     valACYclovir (VALTREX) 500 MG tablet Take 1 tablet (500 mg total) by mouth 2 (two) times daily. As needed 30 tablet 5   No facility-administered medications prior to visit.    Allergies  Allergen Reactions   Latex Itching   Neosporin [Neomycin-Bacitracin Zn-Polymyx]    Prednisone Swelling    And redness   Sulfa Antibiotics Other (See Comments)   Benzalkonium Chloride Itching and Rash    Review of Systems  Musculoskeletal: Negative for arthralgias, back pain, gait problem, joint swelling and myalgias.       Leg discomfort secondary to veins'       Objective:    Physical Exam  BP 117/89 (BP Location: Right Arm, Patient Position: Sitting, Cuff Size: Normal)    Pulse 62    Temp 98.4 F (36.9 C) (Oral)    Wt 150 lb (68 kg)    SpO2 98%    BMI 24.21 kg/m  Wt Readings from Last 3 Encounters:  05/01/20 150 lb (68 kg)  02/14/20 146 lb (66.2 kg)  01/13/20 145 lb (65.8 kg)    There are no preventive care reminders to display for this patient.  There are no preventive care reminders to display for this patient.   Lab Results  Component Value Date   TSH 2.520 06/13/2016   Lab Results  Component Value Date   WBC 4.9 07/28/2019   HGB 13.3 07/28/2019   HCT 39.0 07/28/2019   MCV 97 07/28/2019   PLT 270 07/28/2019   Lab Results  Component Value Date   NA 145 (H) 07/28/2019   K 4.6 07/28/2019   CO2 24 07/28/2019   GLUCOSE 91 07/28/2019   BUN 14 07/28/2019   CREATININE 1.04 (H) 07/28/2019   BILITOT 0.6 07/28/2019   ALKPHOS 78 07/28/2019   AST 20 07/28/2019   ALT 8 07/28/2019   PROT 6.4 07/28/2019   ALBUMIN 4.3 07/28/2019   CALCIUM 9.7 07/28/2019   Lab Results  Component Value Date   CHOL 221 (H) 07/28/2019   Lab Results  Component Value Date   HDL 74 07/28/2019   Lab Results  Component Value Date   LDLCALC 131 (H) 07/28/2019   Lab Results  Component Value Date   TRIG 90 07/28/2019   Lab Results  Component Value Date   CHOLHDL 3.0 07/28/2019    No results found for: HGBA1C     Assessment & Plan:   1. Varicose veins of both lower extremities with pain Has developed an enlargement of varicose veins in the right thigh with some tenderness over the past few weeks. No swelling (both  calf circumferences are 13") or redness. Will refer back to Dr. Lucky Cowboy (vascular surgeon) that has treated veins in the past. Encouraged to use support hose prn. - Ambulatory referral to Vascular Surgery    No orders of the defined types were placed in this encounter.    Juluis Mire, CMA

## 2020-05-10 ENCOUNTER — Other Ambulatory Visit (INDEPENDENT_AMBULATORY_CARE_PROVIDER_SITE_OTHER): Payer: Self-pay | Admitting: Nurse Practitioner

## 2020-05-10 DIAGNOSIS — I83813 Varicose veins of bilateral lower extremities with pain: Secondary | ICD-10-CM

## 2020-05-16 ENCOUNTER — Other Ambulatory Visit: Payer: Self-pay

## 2020-05-16 ENCOUNTER — Encounter (INDEPENDENT_AMBULATORY_CARE_PROVIDER_SITE_OTHER): Payer: Self-pay | Admitting: Nurse Practitioner

## 2020-05-16 ENCOUNTER — Ambulatory Visit (INDEPENDENT_AMBULATORY_CARE_PROVIDER_SITE_OTHER): Payer: Medicare Other

## 2020-05-16 ENCOUNTER — Ambulatory Visit (INDEPENDENT_AMBULATORY_CARE_PROVIDER_SITE_OTHER): Payer: Medicare Other | Admitting: Nurse Practitioner

## 2020-05-16 VITALS — BP 137/78 | HR 59 | Ht 66.0 in | Wt 146.0 lb

## 2020-05-16 DIAGNOSIS — I83813 Varicose veins of bilateral lower extremities with pain: Secondary | ICD-10-CM

## 2020-05-16 DIAGNOSIS — E7800 Pure hypercholesterolemia, unspecified: Secondary | ICD-10-CM

## 2020-05-21 NOTE — Progress Notes (Signed)
Subjective:    Patient ID: Diana Blanchard, female    DOB: November 18, 1945, 74 y.o.   MRN: [id number] Chief Complaint  Patient presents with  . New Patient (Initial Visit)    chrismon.varicose veins BLE ven reflux    The patient is seen for evaluation of symptomatic varicose veins. The patient relates burning and stinging which worsened steadily throughout the course of the day, particularly with standing. The patient also notes an aching and throbbing pain over the varicosities, particularly with prolonged dependent positions. The symptoms are significantly improved with elevation.  The patient also notes that during hot weather the symptoms are greatly intensified. The patient states the pain from the varicose veins interferes with work, daily exercise, shopping and household maintenance. At this point, the symptoms are persistent and severe enough that they're having a negative impact on lifestyle and are interfering with daily activities.  There is no history of DVT, PE or superficial thrombophlebitis. There is no history of ulceration or hemorrhage. The patient denies a significant family history of varicose veins.   The patient has worn graduated compression in the past. At the present time the patient has been using over-the-counter analgesics. There is no prior history of endovenous laser ablation of the right lower extremity.  Today noninvasive studies show evidence of reflux in the great saphenous vein at the saphenofemoral junction extending to the distal thigh.  No evidence of DVT or superficial venous reflux.  No evidence of deep venous insufficiency seen in the right lower extremity.    Review of Systems  Hematological: Bruises/bleeds easily.  All other systems reviewed and are negative.      Objective:   Physical Exam Vitals reviewed.  HENT:     Head: Normocephalic.  Cardiovascular:     Rate and Rhythm: Normal rate.     Pulses: Normal pulses.     Comments: Varicosities in  R medial thigh Pulmonary:     Effort: Pulmonary effort is normal.  Skin:    General: Skin is warm and dry.  Neurological:     Mental Status: She is alert and oriented to person, place, and time.  Psychiatric:        Mood and Affect: Mood normal.        Behavior: Behavior normal.        Thought Content: Thought content normal.        Judgment: Judgment normal.     BP 137/78   Pulse (!) 59   Ht 5\' 6"  (1.676 m)   Wt 146 lb (66.2 kg)   BMI 23.57 kg/m   Past Medical History:  Diagnosis Date  . Anxiety   . Atypical mole   . Billowing mitral valve   . Depression   . GERD (gastroesophageal reflux disease)   . Headache   . Hypercholesterolemia     Social History   Socioeconomic History  . Marital status: Married    Spouse name: Merry Proud  . Number of children: 1  . Years of education: business college  . Highest education level: Associate degree: occupational, Hotel manager, or vocational program  Occupational History  . Occupation: Rertired  Tobacco Use  . Smoking status: Former Smoker    Quit date: 06/16/1969    Years since quitting: 50.9  . Smokeless tobacco: Never Used  Vaping Use  . Vaping Use: Never used  Substance and Sexual Activity  . Alcohol use: Yes    Alcohol/week: 4.0 - 6.0 standard drinks    Types: 4 - 6  Glasses of wine per week  . Drug use: No  . Sexual activity: Not on file  Other Topics Concern  . Not on file  Social History Narrative  . Not on file   Social Determinants of Health   Financial Resource Strain: Low Risk   . Difficulty of Paying Living Expenses: Not hard at all  Food Insecurity: No Food Insecurity  . Worried About Charity fundraiser in the Last Year: Never true  . Ran Out of Food in the Last Year: Never true  Transportation Needs: No Transportation Needs  . Lack of Transportation (Medical): No  . Lack of Transportation (Non-Medical): No  Physical Activity: Inactive  . Days of Exercise per Week: 0 days  . Minutes of Exercise per  Session: 0 min  Stress: Stress Concern Present  . Feeling of Stress : Rather much  Social Connections: Moderately Integrated  . Frequency of Communication with Friends and Family: More than three times a week  . Frequency of Social Gatherings with Friends and Family: More than three times a week  . Attends Religious Services: More than 4 times per year  . Active Member of Clubs or Organizations: No  . Attends Archivist Meetings: Never  . Marital Status: Married  Human resources officer Violence: Not At Risk  . Fear of Current or Ex-Partner: No  . Emotionally Abused: No  . Physically Abused: No  . Sexually Abused: No    Past Surgical History:  Procedure Laterality Date  . bowel obstruction  1990  . BREAST CYST ASPIRATION Right 1980's  . COLONOSCOPY WITH PROPOFOL N/A 06/29/2015   Procedure: COLONOSCOPY WITH PROPOFOL;  Surgeon: Manya Silvas, MD;  Location: [facility] ENDOSCOPY;  Service: Endoscopy;  Laterality: N/A;  . cyst removed  1981   on the ovary  . LAPAROSCOPY  [id number]   complications from IUD  . ROTATOR CUFF REPAIR Right 06/2011  . TONSILLECTOMY AND ADENOIDECTOMY  1953    Family History  Problem Relation Age of Onset  . Arthritis Mother   . Hyperlipidemia Mother   . Hypertension Mother   . Alzheimer's disease Mother   . Diabetes Father   . CVA Father   . HIV Brother   . Breast cancer Neg Hx     Allergies  Allergen Reactions  . Latex Itching  . Neosporin [Neomycin-Bacitracin Zn-Polymyx]   . Prednisone Swelling    And redness  . Sulfa Antibiotics Other (See Comments)  . Benzalkonium Chloride Itching and Rash    CBC Latest Ref Rng & Units 07/28/2019 07/13/2018 06/27/2017  WBC 3.4 - 10.8 x10E3/uL 4.9 4.7 4.2  Hemoglobin 11.1 - 15.9 g/dL 13.3 12.7 13.6  Hematocrit 34.0 - 46.6 % 39.0 37.1 40.2  Platelets 150 - 450 x10E3/uL 270 249 277      CMP     Component Value Date/Time   NA 145 (H) 07/28/2019 1110   K 4.6 07/28/2019 1110   CL 107 (H) 07/28/2019 1110    CO2 24 07/28/2019 1110   GLUCOSE 91 07/28/2019 1110   BUN 14 07/28/2019 1110   CREATININE 1.04 (H) 07/28/2019 1110   CALCIUM 9.7 07/28/2019 1110   PROT 6.4 07/28/2019 1110   ALBUMIN 4.3 07/28/2019 1110   AST 20 07/28/2019 1110   ALT 8 07/28/2019 1110   ALKPHOS 78 07/28/2019 1110   BILITOT 0.6 07/28/2019 1110   GFRNONAA 54 (L) 07/28/2019 1110   GFRAA 62 07/28/2019 1110     No results found.  Assessment & Plan:   1. Varicose veins of both lower extremities with pain Recommend  I have reviewed my discussion with the patient regarding  varicose veins and why they cause symptoms. Patient will continue  wearing graduated compression stockings class 1 on a daily basis, beginning first thing in the morning and removing them in the evening.    In addition, behavioral modification including elevation during the day was again discussed and this will continue.  The patient has utilized over the counter pain medications and has been exercising.  However, at this time conservative therapy has not alleviated the patient's symptoms of leg pain and swelling  Recommend: laser ablation of the right great saphenous veins to eliminate the symptoms of pain and swelling of the lower extremities caused by the severe superficial venous reflux disease.   2. Hypercholesterolemia Continue statin as ordered and reviewed, no changes at this time    Current Outpatient Medications on File Prior to Visit  Medication Sig Dispense Refill  . buPROPion (WELLBUTRIN XL) 150 MG 24 hr tablet Take 1 tablet (150 mg total) by mouth daily. 90 tablet 1  . escitalopram (LEXAPRO) 20 MG tablet TAKE 1 TABLET(20 MG) BY MOUTH DAILY 90 tablet 1  . Multiple Vitamins-Minerals (WOMENS 50+ MULTI VITAMIN/MIN) TABS Take by mouth daily.    . simvastatin (ZOCOR) 20 MG tablet TAKE 1 TABLET(20 MG) BY MOUTH DAILY 90 tablet 0  . valACYclovir (VALTREX) 500 MG tablet Take 1 tablet (500 mg total) by mouth 2 (two) times daily. As needed  30 tablet 5   No current facility-administered medications on file prior to visit.    There are no Patient Instructions on file for this visit. No follow-ups on file.   Kris Hartmann, NP

## 2020-05-22 ENCOUNTER — Encounter (INDEPENDENT_AMBULATORY_CARE_PROVIDER_SITE_OTHER): Payer: Self-pay | Admitting: Nurse Practitioner

## 2020-06-12 DIAGNOSIS — R002 Palpitations: Secondary | ICD-10-CM | POA: Insufficient documentation

## 2020-07-06 NOTE — Progress Notes (Signed)
Subjective:   Diana Blanchard is a 75 y.o. female who presents for Medicare Annual (Subsequent) preventive examination.  I connected with Daisy Lazar today by telephone and verified that I am speaking with the correct person using two identifiers. Location patient: home Location provider: work Persons participating in the virtual visit: patient, provider.   I discussed the limitations, risks, security and privacy concerns of performing an evaluation and management service by telephone and the availability of in person appointments. I also discussed with the patient that there may be a patient responsible charge related to this service. The patient expressed understanding and verbally consented to this telephonic visit.    Interactive audio and video telecommunications were attempted between this provider and patient, however failed, due to patient having technical difficulties OR patient did not have access to video capability.  We continued and completed visit with audio only.   Review of Systems    N/A  Cardiac Risk Factors include: advanced age (>15men, >24 women);dyslipidemia     Objective:    There were no vitals filed for this visit. There is no height or weight on file to calculate BMI.  Advanced Directives 07/10/2020 07/06/2019 06/30/2018 08/06/2016 06/07/2016 06/02/2015  Does Patient Have a Medical Advance Directive? Yes Yes Yes Yes Yes Yes  Type of Paramedic of [place];Living will [place];Living will [place];Living will [place];Living will [place];Living will [place];Living will  Copy of [place] in Chart? No - copy requested No - copy requested No - copy requested - - -    Current Medications (verified) Outpatient Encounter Medications as of 07/10/2020  Medication Sig  . buPROPion (WELLBUTRIN XL) 150 MG 24 hr tablet Take 1 tablet  (150 mg total) by mouth daily.  Marland Kitchen escitalopram (LEXAPRO) 20 MG tablet TAKE 1 TABLET(20 MG) BY MOUTH DAILY  . Multiple Vitamins-Minerals (WOMENS 50+ MULTI VITAMIN/MIN) TABS Take by mouth daily.  . simvastatin (ZOCOR) 20 MG tablet TAKE 1 TABLET(20 MG) BY MOUTH DAILY  . valACYclovir (VALTREX) 500 MG tablet Take 1 tablet (500 mg total) by mouth 2 (two) times daily. As needed   No facility-administered encounter medications on file as of 07/10/2020.    Allergies (verified) Latex, Neosporin [neomycin-bacitracin zn-polymyx], Prednisone, Sulfa antibiotics, and Benzalkonium chloride   History: Past Medical History:  Diagnosis Date  . Anxiety   . Atypical mole   . Billowing mitral valve   . Depression   . GERD (gastroesophageal reflux disease)   . Headache   . Hypercholesterolemia    Past Surgical History:  Procedure Laterality Date  . bowel obstruction  1990  . BREAST CYST ASPIRATION Right 1980's  . COLONOSCOPY WITH PROPOFOL N/A 06/29/2015   Procedure: COLONOSCOPY WITH PROPOFOL;  Surgeon: Manya Silvas, MD;  Location: [facility] ENDOSCOPY;  Service: Endoscopy;  Laterality: N/A;  . cyst removed  1981   on the ovary  . LAPAROSCOPY  [id number]   complications from IUD  . ROTATOR CUFF REPAIR Right 06/2011  . TONSILLECTOMY AND ADENOIDECTOMY  1953   Family History  Problem Relation Age of Onset  . Arthritis Mother   . Hyperlipidemia Mother   . Hypertension Mother   . Alzheimer's disease Mother   . Diabetes Father   . CVA Father   . HIV Brother   . Breast cancer Neg Hx    Social History   Socioeconomic History  . Marital status: Married  Spouse name: Merry Proud  . Number of children: 1  . Years of education: business college  . Highest education level: Associate degree: occupational, Hotel manager, or vocational program  Occupational History  . Occupation: Retired  Tobacco Use  . Smoking status: Former Smoker    Quit date: 06/16/1969    Years since quitting: 51.1  . Smokeless tobacco: Never  Used  Vaping Use  . Vaping Use: Never used  Substance and Sexual Activity  . Alcohol use: Yes    Alcohol/week: 4.0 - 6.0 standard drinks    Types: 4 - 6 Glasses of wine per week  . Drug use: No  . Sexual activity: Not on file  Other Topics Concern  . Not on file  Social History Narrative  . Not on file   Social Determinants of Health   Financial Resource Strain: Low Risk   . Difficulty of Paying Living Expenses: Not hard at all  Food Insecurity: No Food Insecurity  . Worried About Charity fundraiser in the Last Year: Never true  . Ran Out of Food in the Last Year: Never true  Transportation Needs: No Transportation Needs  . Lack of Transportation (Medical): No  . Lack of Transportation (Non-Medical): No  Physical Activity: Inactive  . Days of Exercise per Week: 0 days  . Minutes of Exercise per Session: 0 min  Stress: No Stress Concern Present  . Feeling of Stress : Not at all  Social Connections: Moderately Integrated  . Frequency of Communication with Friends and Family: More than three times a week  . Frequency of Social Gatherings with Friends and Family: More than three times a week  . Attends Religious Services: More than 4 times per year  . Active Member of Clubs or Organizations: No  . Attends Archivist Meetings: Never  . Marital Status: Married    Tobacco Counseling Counseling given: Not Answered   Clinical Intake:  Pre-visit preparation completed: Yes  Pain : No/denies pain     Nutritional Risks: None Diabetes: No  How often do you need to have someone help you when you read instructions, pamphlets, or other written materials from your doctor or pharmacy?: 1 - Never  Diabetic? No  Interpreter Needed?: No  Information entered by :: [facility], LPN   Activities of Daily Living In your present state of health, do you have any difficulty performing the following activities: 07/10/2020  Hearing? N  Vision? N  Difficulty concentrating or  making decisions? N  Walking or climbing stairs? N  Dressing or bathing? N  Doing errands, shopping? N  Preparing Food and eating ? N  Using the Toilet? N  In the past six months, have you accidently leaked urine? N  Do you have problems with loss of bowel control? N  Managing your Medications? N  Managing your Finances? N  Housekeeping or managing your Housekeeping? N  Some recent data might be hidden    Patient Care Team: Mar Daring, PA-C as PCP - General (Family Medicine) Ralene Bathe, MD (Dermatology) Arelia Sneddon, [place] (Optometry) Lucky Cowboy Erskine Squibb, MD as Referring Physician (Vascular Surgery) Rosalene Billings., MD (Dentistry)  Indicate any recent Medical Services you may have received from other than [facility] providers in the past year (date may be approximate).     Assessment:   This is a routine wellness examination for Breeana.  Hearing/Vision screen No exam data present  Dietary issues and exercise activities discussed: Current Exercise Habits: The patient does not  participate in regular exercise at present, Exercise limited by: None identified  Goals    . Exercise 3x per week (30 min per time)     Recommend to exercise for 3 days a week for at least 30 minutes at a time.       Depression Screen PHQ 2/9 Scores 07/10/2020 01/13/2020 07/06/2019 07/06/2019 06/30/2018 06/23/2017 06/07/2016  PHQ - 2 Score 1 3 1 1 2 1 1   PHQ- 9 Score - 5 - - 2 - -    Fall Risk Fall Risk  07/10/2020 07/06/2019 06/30/2018 06/23/2017 06/07/2016  Falls in the past year? 0 0 0 No No  Number falls in past yr: 0 0 - - -  Injury with Fall? 0 0 - - -    FALL RISK PREVENTION PERTAINING TO THE HOME:  Any stairs in or around the home? Yes  If so, are there any without handrails? No  Home free of loose throw rugs in walkways, pet beds, electrical cords, etc? Yes  Adequate lighting in your home to reduce risk of falls? Yes   ASSISTIVE DEVICES UTILIZED TO PREVENT FALLS:  Life alert? No   Use of a cane, walker or w/c? No  Grab bars in the bathroom? Yes  Shower chair or bench in shower? No  Elevated toilet seat or a handicapped toilet? Yes    Cognitive Function: Normal cognitive status assessed by observation by this Nurse Health Advisor. No abnormalities found.       6CIT Screen 07/06/2019 06/30/2018  What Year? 0 points 0 points  What month? 0 points 0 points  What time? 0 points 0 points  Count back from 20 0 points 0 points  Months in reverse 0 points 0 points  Repeat phrase 0 points 0 points  Total Score 0 0    Immunizations Immunization History  Administered Date(s) Administered  . Fluad Quad(high Dose 65+) 03/17/2019, 03/23/2020  . Influenza Split 04/03/2009, 03/01/2010, 03/08/2011, 04/06/2012  . Influenza, High Dose Seasonal PF 04/19/2014, 04/05/2015, 04/11/2016, 03/26/2017, 04/08/2018  . Influenza,inj,Quad PF,6+ Mos 04/07/2013  . PFIZER(Purple Top)SARS-COV-2 Vaccination 06/21/2019, 07/12/2019, 04/20/2020  . Pneumococcal Conjugate-13 06/03/2014  . Pneumococcal Polysaccharide-23 06/17/1998, 04/07/2013  . Tdap 04/09/2005, 06/02/2015  . Zoster 01/13/2008  . Zoster Recombinat (Shingrix) 12/02/2017, 04/07/2018    TDAP status: Up to date  Flu Vaccine status: Up to date  Pneumococcal vaccine status: Up to date  Covid-19 vaccine status: Completed vaccines  Qualifies for Shingles Vaccine? Yes   Zostavax completed Yes   Shingrix Completed?: Yes  Screening Tests Health Maintenance  Topic Date Due  . COLONOSCOPY (Pts 45-65yrs Insurance coverage will need to be confirmed)  06/28/2020  . COVID-19 Vaccine (4 - Booster for Pfizer series) 10/18/2020  . MAMMOGRAM  07/15/2021  . DEXA SCAN  07/22/2022  . TETANUS/TDAP  06/01/2025  . INFLUENZA VACCINE  Completed  . Hepatitis C Screening  Completed  . PNA vac Low Risk Adult  Completed    Health Maintenance  Health Maintenance Due  Topic Date Due  . COLONOSCOPY (Pts 45-25yrs Insurance coverage will need  to be confirmed)  06/28/2020    Colorectal cancer screening: Referral to GI placed today. Pt aware the office will call re: appt.  Mammogram status: Completed 07/16/19. Repeat every year. Ordered today. Pt aware to contact office to schedule apt.   Bone Density status: Completed 07/22/17. Results reflect: Bone density results: OSTEOPENIA. Repeat every 5 years.  Lung Cancer Screening: (Low Dose CT Chest recommended if Age 73-80 years,  30 pack-year currently smoking OR have quit w/in 15years.) does not qualify.   Additional Screening:  Hepatitis C Screening: Up to date  Vision Screening: Recommended annual ophthalmology exams for early detection of glaucoma and other disorders of the eye. Is the patient up to date with their annual eye exam?  Yes  Who is the provider or what is the name of the office in which the patient attends annual eye exams? Dr Annamaria Helling If pt is not established with a provider, would they like to be referred to a provider to establish care? No .   Dental Screening: Recommended annual dental exams for proper oral hygiene  Community Resource Referral / Chronic Care Management: CRR required this visit?  No   CCM required this visit?  No      Plan:     I have personally reviewed and noted the following in the patient's chart:   . Medical and social history . Use of alcohol, tobacco or illicit drugs  . Current medications and supplements . Functional ability and status . Nutritional status . Physical activity . Advanced directives . List of other physicians . Hospitalizations, surgeries, and ER visits in previous 12 months . Vitals . Screenings to include cognitive, depression, and falls . Referrals and appointments  In addition, I have reviewed and discussed with patient certain preventive protocols, quality metrics, and best practice recommendations. A written personalized care plan for preventive services as well as general preventive health  recommendations were provided to patient.     Marylin Lathon [place], [place]   [id number]   Nurse Notes: Referral to GI placed today. Mammogram also ordered and pt is aware to call [facility] to schedule apt.

## 2020-07-10 ENCOUNTER — Other Ambulatory Visit: Payer: Self-pay

## 2020-07-10 ENCOUNTER — Ambulatory Visit (INDEPENDENT_AMBULATORY_CARE_PROVIDER_SITE_OTHER): Payer: Medicare Other

## 2020-07-10 DIAGNOSIS — Z1211 Encounter for screening for malignant neoplasm of colon: Secondary | ICD-10-CM | POA: Diagnosis not present

## 2020-07-10 DIAGNOSIS — Z1231 Encounter for screening mammogram for malignant neoplasm of breast: Secondary | ICD-10-CM

## 2020-07-10 DIAGNOSIS — Z0000 Encounter for general adult medical examination without abnormal findings: Secondary | ICD-10-CM

## 2020-07-10 NOTE — Patient Instructions (Signed)
Diana Blanchard , Thank you for taking time to come for your Medicare Wellness Visit. I appreciate your ongoing commitment to your health goals. Please review the following plan we discussed and let me know if I can assist you in the future.   Screening recommendations/referrals: Colonoscopy: Referral to GI placed today. Pt aware office will contact her re:apt. Mammogram: Ordered today. Please call [phone] to schedule your mammogram.  Bone Density: Up to date, due 07/2022 Recommended yearly ophthalmology/optometry visit for glaucoma screening and checkup Recommended yearly dental visit for hygiene and checkup  Vaccinations: Influenza vaccine: Done 03/23/20 Pneumococcal vaccine: Completed series Tdap vaccine: Up to date, due 05/2025 Shingles vaccine: Completed series    Advanced directives: Please bring a copy of your POA (Power of [place]) and/or Living Will to your next appointment.   Conditions/risks identified: Recommend to start exercising 3 days a week for at least 30 minutes at a time.  Next appointment: 07/28/20 @ 2:20 PM with [place] 65 Years and Older, Female Preventive care refers to lifestyle choices and visits with your health care provider that can promote health and wellness. What does preventive care include?  A yearly physical exam. This is also called an annual well check.  Dental exams once or twice a year.  Routine eye exams. Ask your health care provider how often you should have your eyes checked.  Personal lifestyle choices, including:  Daily care of your teeth and gums.  Regular physical activity.  Eating a healthy diet.  Avoiding tobacco and drug use.  Limiting alcohol use.  Practicing safe sex.  Taking low-dose aspirin every day.  Taking vitamin and mineral supplements as recommended by your health care provider. What happens during an annual well check? The services and screenings done by your health care provider  during your annual well check will depend on your age, overall health, lifestyle risk factors, and family history of disease. Counseling  Your health care provider may ask you questions about your:  Alcohol use.  Tobacco use.  Drug use.  Emotional well-being.  Home and relationship well-being.  Sexual activity.  Eating habits.  History of falls.  Memory and ability to understand (cognition).  Work and work Statistician.  Reproductive health. Screening  You may have the following tests or measurements:  Height, weight, and BMI.  Blood pressure.  Lipid and cholesterol levels. These may be checked every 5 years, or more frequently if you are over 48 years old.  Skin check.  Lung cancer screening. You may have this screening every year starting at age 64 if you have a 30-pack-year history of smoking and currently smoke or have quit within the past 15 years.  Fecal occult blood test (FOBT) of the stool. You may have this test every year starting at age 50.  Flexible sigmoidoscopy or colonoscopy. You may have a sigmoidoscopy every 5 years or a colonoscopy every 10 years starting at age 29.  Hepatitis C blood test.  Hepatitis B blood test.  Sexually transmitted disease (STD) testing.  Diabetes screening. This is done by checking your blood sugar (glucose) after you have not eaten for a while (fasting). You may have this done every 1-3 years.  Bone density scan. This is done to screen for osteoporosis. You may have this done starting at age 29.  Mammogram. This may be done every 1-2 years. Talk to your health care provider about how often you should have regular mammograms. Talk with your health care provider  about your test results, treatment options, and if necessary, the need for more tests. Vaccines  Your health care provider may recommend certain vaccines, such as:  Influenza vaccine. This is recommended every year.  Tetanus, diphtheria, and acellular pertussis  (Tdap, Td) vaccine. You may need a Td booster every 10 years.  Zoster vaccine. You may need this after age 56.  Pneumococcal 13-valent conjugate (PCV13) vaccine. One dose is recommended after age 40.  Pneumococcal polysaccharide (PPSV23) vaccine. One dose is recommended after age 103. Talk to your health care provider about which screenings and vaccines you need and how often you need them. This information is not intended to replace advice given to you by your health care provider. Make sure you discuss any questions you have with your health care provider. Document Released: 06/30/2015 Document Revised: 02/21/2016 Document Reviewed: 04/04/2015 Elsevier Interactive Patient Education  2017 [employer] Prevention in the Home Falls can cause injuries. They can happen to people of all ages. There are many things you can do to make your home safe and to help prevent falls. What can I do on the outside of my home?  Regularly fix the edges of walkways and driveways and fix any cracks.  Remove anything that might make you trip as you walk through a door, such as a raised step or threshold.  Trim any bushes or trees on the path to your home.  Use bright outdoor lighting.  Clear any walking paths of anything that might make someone trip, such as rocks or tools.  Regularly check to see if handrails are loose or broken. Make sure that both sides of any steps have handrails.  Any raised decks and porches should have guardrails on the edges.  Have any leaves, snow, or ice cleared regularly.  Use sand or salt on walking paths during winter.  Clean up any spills in your garage right away. This includes oil or grease spills. What can I do in the bathroom?  Use night lights.  Install grab bars by the toilet and in the tub and shower. Do not use towel bars as grab bars.  Use non-skid mats or decals in the tub or shower.  If you need to sit down in the shower, use a plastic, non-slip  stool.  Keep the floor dry. Clean up any water that spills on the floor as soon as it happens.  Remove soap buildup in the tub or shower regularly.  Attach bath mats securely with double-sided non-slip rug tape.  Do not have throw rugs and other things on the floor that can make you trip. What can I do in the bedroom?  Use night lights.  Make sure that you have a light by your bed that is easy to reach.  Do not use any sheets or blankets that are too big for your bed. They should not hang down onto the floor.  Have a firm chair that has side arms. You can use this for support while you get dressed.  Do not have throw rugs and other things on the floor that can make you trip. What can I do in the kitchen?  Clean up any spills right away.  Avoid walking on wet floors.  Keep items that you use a lot in easy-to-reach places.  If you need to reach something above you, use a strong step stool that has a grab bar.  Keep electrical cords out of the way.  Do not use floor polish or  wax that makes floors slippery. If you must use wax, use non-skid floor wax.  Do not have throw rugs and other things on the floor that can make you trip. What can I do with my stairs?  Do not leave any items on the stairs.  Make sure that there are handrails on both sides of the stairs and use them. Fix handrails that are broken or loose. Make sure that handrails are as long as the stairways.  Check any carpeting to make sure that it is firmly attached to the stairs. Fix any carpet that is loose or worn.  Avoid having throw rugs at the top or bottom of the stairs. If you do have throw rugs, attach them to the floor with carpet tape.  Make sure that you have a light switch at the top of the stairs and the bottom of the stairs. If you do not have them, ask someone to add them for you. What else can I do to help prevent falls?  Wear shoes that:  Do not have high heels.  Have rubber bottoms.  Are  comfortable and fit you well.  Are closed at the toe. Do not wear sandals.  If you use a stepladder:  Make sure that it is fully opened. Do not climb a closed stepladder.  Make sure that both sides of the stepladder are locked into place.  Ask someone to hold it for you, if possible.  Clearly mark and make sure that you can see:  Any grab bars or handrails.  First and last steps.  Where the edge of each step is.  Use tools that help you move around (mobility aids) if they are needed. These include:  Canes.  Walkers.  Scooters.  Crutches.  Turn on the lights when you go into a dark area. Replace any light bulbs as soon as they burn out.  Set up your furniture so you have a clear path. Avoid moving your furniture around.  If any of your floors are uneven, fix them.  If there are any pets around you, be aware of where they are.  Review your medicines with your doctor. Some medicines can make you feel dizzy. This can increase your chance of falling. Ask your doctor what other things that you can do to help prevent falls. This information is not intended to replace advice given to you by your health care provider. Make sure you discuss any questions you have with your health care provider. Document Released: 03/30/2009 Document Revised: 11/09/2015 Document Reviewed: 07/08/2014 Elsevier Interactive Patient Education  2017 [employer].

## 2020-07-12 ENCOUNTER — Other Ambulatory Visit: Payer: Self-pay

## 2020-07-12 ENCOUNTER — Telehealth (INDEPENDENT_AMBULATORY_CARE_PROVIDER_SITE_OTHER): Payer: Self-pay | Admitting: Gastroenterology

## 2020-07-12 DIAGNOSIS — Z86010 Personal history of colonic polyps: Secondary | ICD-10-CM

## 2020-07-12 MED ORDER — PEG 3350-KCL-NA BICARB-NACL 420 G PO SOLR: 4000.0000 mL | Freq: Once | ORAL | 0 refills | Status: AC

## 2020-07-12 NOTE — Progress Notes (Signed)
Gastroenterology Pre-Procedure Review  Request Date: Thursday 07/27/20 Requesting Physician: Dr. Allen Norris  PATIENT REVIEW QUESTIONS: The patient responded to the following health history questions as indicated:    1. Are you having any GI issues? no 2. Do you have a personal history of Polyps? yes (however no polyps noted on 2017 colonoscopy performed by Dr. Vira Agar) 3. Do you have a family history of Colon Cancer or Polyps? no 4. Diabetes Mellitus? no 5. Joint replacements in the past 12 months?no 6. Major health problems in the past 3 months?no 7. Any artificial heart valves, MVP, or defibrillator?no    MEDICATIONS & ALLERGIES:    Patient reports the following regarding taking any anticoagulation/antiplatelet therapy:   Plavix, Coumadin, Eliquis, Xarelto, Lovenox, Pradaxa, Brilinta, or Effient? no Aspirin? no  Patient confirms/reports the following medications:  Current Outpatient Medications  Medication Sig Dispense Refill  . buPROPion (WELLBUTRIN XL) 150 MG 24 hr tablet Take 1 tablet (150 mg total) by mouth daily. 90 tablet 1  . escitalopram (LEXAPRO) 20 MG tablet TAKE 1 TABLET(20 MG) BY MOUTH DAILY 90 tablet 1  . Multiple Vitamins-Minerals (WOMENS 50+ MULTI VITAMIN/MIN) TABS Take by mouth daily.    . simvastatin (ZOCOR) 20 MG tablet TAKE 1 TABLET(20 MG) BY MOUTH DAILY 90 tablet 0  . valACYclovir (VALTREX) 500 MG tablet Take 1 tablet (500 mg total) by mouth 2 (two) times daily. As needed 30 tablet 5   No current facility-administered medications for this visit.    Patient confirms/reports the following allergies:  Allergies  Allergen Reactions  . Latex Itching  . Neosporin [Neomycin-Bacitracin Zn-Polymyx]   . Prednisone Swelling    And redness  . Sulfa Antibiotics Other (See Comments)  . Benzalkonium Chloride Itching and Rash    No orders of the defined types were placed in this encounter.   AUTHORIZATION INFORMATION Primary Insurance: 1D#: Group #:  Secondary  Insurance: 1D#: Group #:  SCHEDULE INFORMATION: Date: 07/27/20 Time: Location:[facility]

## 2020-07-13 ENCOUNTER — Encounter: Payer: Self-pay | Admitting: Gastroenterology

## 2020-07-16 ENCOUNTER — Other Ambulatory Visit: Payer: Self-pay | Admitting: Physician Assistant

## 2020-07-16 DIAGNOSIS — E7800 Pure hypercholesterolemia, unspecified: Secondary | ICD-10-CM

## 2020-07-16 NOTE — Telephone Encounter (Signed)
Requested Prescriptions  Pending Prescriptions Disp Refills  . simvastatin (ZOCOR) 20 MG tablet [Pharmacy Med Name: SIMVASTATIN 20MG  TABLETS] 90 tablet 0    Sig: TAKE 1 TABLET(20 MG) BY MOUTH DAILY     Cardiovascular:  Antilipid - Statins Failed - 07/16/2020  3:14 AM      Failed - Total Cholesterol in normal range and within 360 days    Cholesterol, Total  Date Value Ref Range Status  07/28/2019 221 (H) 100 - 199 mg/dL Final         Failed - LDL in normal range and within 360 days    LDL Chol Calc (NIH)  Date Value Ref Range Status  07/28/2019 131 (H) 0 - 99 mg/dL Final         Passed - HDL in normal range and within 360 days    HDL  Date Value Ref Range Status  07/28/2019 74 >39 mg/dL Final         Passed - Triglycerides in normal range and within 360 days    Triglycerides  Date Value Ref Range Status  07/28/2019 90 0 - 149 mg/dL Final         Passed - Patient is not pregnant      Passed - Valid encounter within last 12 months    Recent Outpatient Visits          2 months ago Varicose veins of both lower extremities with pain   [place], Vickki Muff, PA-C   5 months ago Moderate episode of recurrent major depressive disorder [facility])   [place], [place], PA-C   6 months ago Moderate episode of recurrent major depressive disorder [facility])   [facility] [place], Clearnce Sorrel, [place]   1 year ago Annual physical exam   [employer] [facility], Clearnce Sorrel, [place]   2 years ago Annual physical exam   [employer], Clearnce Sorrel, [place]      Future Appointments            In 1 week Marlyn Corporal, Clearnce Sorrel, PA-C [employer]

## 2020-07-17 ENCOUNTER — Other Ambulatory Visit: Payer: Self-pay

## 2020-07-17 ENCOUNTER — Ambulatory Visit
Admission: RE | Admit: 2020-07-17 | Discharge: 2020-07-17 | Disposition: A | Discharge status: Home / Self Care | Payer: Medicare Other | Source: Ambulatory Visit | Attending: Physician Assistant | Admitting: Physician Assistant

## 2020-07-17 DIAGNOSIS — Z1231 Encounter for screening mammogram for malignant neoplasm of breast: Secondary | ICD-10-CM | POA: Diagnosis not present

## 2020-07-19 ENCOUNTER — Telehealth: Payer: Self-pay | Admitting: Physician Assistant

## 2020-07-19 DIAGNOSIS — F419 Anxiety disorder, unspecified: Secondary | ICD-10-CM

## 2020-07-19 MED ORDER — ESCITALOPRAM OXALATE 20 MG PO TABS: 20.0000 mg | ORAL_TABLET | Freq: Every day | ORAL | 1 refills | Status: DC

## 2020-07-19 NOTE — Telephone Encounter (Signed)
[facility] faxed refill request for the following medications:  escitalopram (LEXAPRO) 20 MG tablet  Last Rx: 01/17/20 90 day supply with 1 refill LOV: 05/01/20 NOV: 07/28/20 Please advise. Thanks TNP

## 2020-07-21 ENCOUNTER — Other Ambulatory Visit (INDEPENDENT_AMBULATORY_CARE_PROVIDER_SITE_OTHER): Payer: Medicare Other | Admitting: Vascular Surgery

## 2020-07-21 ENCOUNTER — Encounter (INDEPENDENT_AMBULATORY_CARE_PROVIDER_SITE_OTHER): Payer: Self-pay

## 2020-07-21 ENCOUNTER — Other Ambulatory Visit: Payer: Self-pay

## 2020-07-24 ENCOUNTER — Telehealth: Payer: Self-pay | Admitting: Gastroenterology

## 2020-07-24 NOTE — Telephone Encounter (Signed)
Patient LM on VM,  She has questions about prep before her procedure scheduled on 07/27/20. Please call to advise

## 2020-07-25 ENCOUNTER — Other Ambulatory Visit
Admission: RE | Admit: 2020-07-25 | Discharge: 2020-07-25 | Disposition: A | Discharge status: Home / Self Care | Payer: Medicare Other | Source: Ambulatory Visit | Attending: Gastroenterology | Admitting: Gastroenterology

## 2020-07-25 ENCOUNTER — Other Ambulatory Visit: Payer: Self-pay

## 2020-07-25 DIAGNOSIS — Z20822 Contact with and (suspected) exposure to covid-19: Secondary | ICD-10-CM | POA: Insufficient documentation

## 2020-07-25 DIAGNOSIS — Z01812 Encounter for preprocedural laboratory examination: Secondary | ICD-10-CM | POA: Diagnosis not present

## 2020-07-25 LAB — SARS CORONAVIRUS 2 (TAT 6-24 HRS): SARS Coronavirus 2: NEGATIVE

## 2020-07-25 NOTE — Telephone Encounter (Signed)
Returned pt's call and reviewed instructions for the [place] she received at the pharmacy.

## 2020-07-26 ENCOUNTER — Encounter (INDEPENDENT_AMBULATORY_CARE_PROVIDER_SITE_OTHER): Payer: Medicare Other

## 2020-07-26 NOTE — Discharge Instructions (Signed)

## 2020-07-27 ENCOUNTER — Ambulatory Visit: Payer: Medicare Other | Admitting: Anesthesiology

## 2020-07-27 ENCOUNTER — Ambulatory Visit
Admission: RE | Admit: 2020-07-27 | Discharge: 2020-07-27 | Disposition: A | Discharge status: Home / Self Care | Payer: Medicare Other | Attending: Gastroenterology | Admitting: Gastroenterology

## 2020-07-27 ENCOUNTER — Encounter: Payer: Self-pay | Admitting: Gastroenterology

## 2020-07-27 ENCOUNTER — Encounter
Admission: RE | Disposition: A | Discharge status: Home / Self Care | Payer: Self-pay | Source: Home / Self Care | Attending: Gastroenterology

## 2020-07-27 ENCOUNTER — Other Ambulatory Visit: Payer: Self-pay

## 2020-07-27 DIAGNOSIS — Z1211 Encounter for screening for malignant neoplasm of colon: Secondary | ICD-10-CM | POA: Insufficient documentation

## 2020-07-27 DIAGNOSIS — Z860100 Personal history of colon polyps, unspecified: Secondary | ICD-10-CM

## 2020-07-27 DIAGNOSIS — Z882 Allergy status to sulfonamides status: Secondary | ICD-10-CM | POA: Diagnosis not present

## 2020-07-27 DIAGNOSIS — K640 First degree hemorrhoids: Secondary | ICD-10-CM | POA: Diagnosis not present

## 2020-07-27 DIAGNOSIS — Z91040 Latex allergy status: Secondary | ICD-10-CM | POA: Insufficient documentation

## 2020-07-27 DIAGNOSIS — D124 Benign neoplasm of descending colon: Secondary | ICD-10-CM | POA: Diagnosis not present

## 2020-07-27 DIAGNOSIS — Z79899 Other long term (current) drug therapy: Secondary | ICD-10-CM | POA: Insufficient documentation

## 2020-07-27 DIAGNOSIS — K635 Polyp of colon: Secondary | ICD-10-CM

## 2020-07-27 DIAGNOSIS — Z881 Allergy status to other antibiotic agents status: Secondary | ICD-10-CM | POA: Insufficient documentation

## 2020-07-27 DIAGNOSIS — Z888 Allergy status to other drugs, medicaments and biological substances status: Secondary | ICD-10-CM | POA: Diagnosis not present

## 2020-07-27 DIAGNOSIS — Z87891 Personal history of nicotine dependence: Secondary | ICD-10-CM | POA: Diagnosis not present

## 2020-07-27 HISTORY — DX: Nonrheumatic mitral (valve) prolapse: I34.1

## 2020-07-27 HISTORY — DX: Asymptomatic varicose veins of unspecified lower extremity: I83.90

## 2020-07-27 HISTORY — PX: COLONOSCOPY WITH PROPOFOL: SHX5780

## 2020-07-27 HISTORY — DX: Unspecified osteoarthritis, unspecified site: M19.90

## 2020-07-27 HISTORY — PX: POLYPECTOMY: SHX5525

## 2020-07-27 HISTORY — DX: Carpal tunnel syndrome, unspecified upper limb: G56.00

## 2020-07-27 SURGERY — COLONOSCOPY WITH PROPOFOL
Anesthesia: General | Site: Rectum

## 2020-07-27 MED ORDER — LACTATED RINGERS IV SOLN: INTRAVENOUS | Status: DC

## 2020-07-27 MED ORDER — ONDANSETRON HCL 4 MG/2ML IJ SOLN: 4.0000 mg | Freq: Once | INTRAMUSCULAR | Status: DC

## 2020-07-27 MED ORDER — PROPOFOL 10 MG/ML IV BOLUS
INTRAVENOUS | Status: DC | PRN
  Administered 2020-07-27 (×2): 30 mg via INTRAVENOUS
  Administered 2020-07-27: 20 mg via INTRAVENOUS
  Administered 2020-07-27 (×2): 30 mg via INTRAVENOUS
  Administered 2020-07-27: 20 mg via INTRAVENOUS
  Administered 2020-07-27 (×3): 30 mg via INTRAVENOUS
  Administered 2020-07-27: 80 mg via INTRAVENOUS
  Administered 2020-07-27: 30 mg via INTRAVENOUS

## 2020-07-27 MED ORDER — LIDOCAINE HCL (CARDIAC) PF 100 MG/5ML IV SOSY
PREFILLED_SYRINGE | INTRAVENOUS | Status: DC | PRN
  Administered 2020-07-27: 50 mg via INTRAVENOUS

## 2020-07-27 MED ORDER — SODIUM CHLORIDE 0.9 % IV SOLN: INTRAVENOUS | Status: DC

## 2020-07-27 MED ORDER — STERILE WATER FOR IRRIGATION IR SOLN
Status: DC | PRN
  Administered 2020-07-27: 150 mL

## 2020-07-27 SURGICAL SUPPLY — 25 items

## 2020-07-27 NOTE — Anesthesia Postprocedure Evaluation (Signed)
Anesthesia Post Note  Patient: Diana Blanchard  Procedure(s) Performed: COLONOSCOPY WITH PROPOFOL (N/A Rectum) POLYPECTOMY (N/A Rectum)     Patient location during evaluation: PACU Anesthesia Type: General Level of consciousness: awake Pain management: pain level controlled Vital Signs Assessment: post-procedure vital signs reviewed and stable Respiratory status: respiratory function stable Cardiovascular status: stable Postop Assessment: no signs of nausea or vomiting Anesthetic complications: no   No complications documented.  Veda Canning

## 2020-07-27 NOTE — Transfer of Care (Signed)
Immediate Anesthesia Transfer of Care Note  Patient: Diana Blanchard  Procedure(s) Performed: COLONOSCOPY WITH PROPOFOL (N/A Rectum) POLYPECTOMY (N/A Rectum)  Patient Location: PACU  Anesthesia Type: General  Level of Consciousness: awake, alert  and patient cooperative  Airway and Oxygen Therapy: Patient Spontanous Breathing and Patient connected to supplemental oxygen  Post-op Assessment: Post-op Vital signs reviewed, Patient's Cardiovascular Status Stable, Respiratory Function Stable, Patent Airway and No signs of Nausea or vomiting  Post-op Vital Signs: Reviewed and stable  Complications: No complications documented.

## 2020-07-27 NOTE — H&P (Signed)
Lucilla Lame, MD [facility] [address], [place], [place] [address] [phone] Fax : [phone]  Primary Care Physician:  Mar Daring, PA-C Primary Gastroenterologist:  Dr. Allen Norris  Pre-Procedure History & Physical: HPI:  Diana Blanchard is a 75 y.o. female is here for an colonoscopy.   Past Medical History:  Diagnosis Date  . Anxiety   . Arthritis    fingers  . Atypical mole   . Billowing mitral valve   . Carpal tunnel syndrome   . Depression   . GERD (gastroesophageal reflux disease)   . Headache   . Hypercholesterolemia   . Mitral valve prolapse   . Varicose vein of leg     Past Surgical History:  Procedure Laterality Date  . bowel obstruction  1990  . BREAST CYST ASPIRATION Right 1980's  . COLONOSCOPY WITH PROPOFOL N/A 06/29/2015   Procedure: COLONOSCOPY WITH PROPOFOL;  Surgeon: Manya Silvas, MD;  Location: [facility] ENDOSCOPY;  Service: Endoscopy;  Laterality: N/A;  . cyst removed  1981   on the ovary  . LAPAROSCOPY  2778   complications from IUD  . ROTATOR CUFF REPAIR Right 06/2011  . TONSILLECTOMY AND ADENOIDECTOMY  1953    Prior to Admission medications   Medication Sig Start Date End Date Taking? Authorizing Provider  buPROPion (WELLBUTRIN XL) 150 MG 24 hr tablet Take 1 tablet (150 mg total) by mouth daily. 02/14/20  Yes Mar Daring, PA-C  escitalopram (LEXAPRO) 20 MG tablet Take 1 tablet (20 mg total) by mouth daily. 07/19/20  Yes Mar Daring, PA-C  Multiple Vitamins-Minerals (WOMENS 50+ MULTI VITAMIN/MIN) TABS Take by mouth daily.   Yes [provider]  simvastatin (ZOCOR) 20 MG tablet TAKE 1 TABLET(20 MG) BY MOUTH DAILY 07/16/20  Yes Fenton Malling M, PA-C  valACYclovir (VALTREX) 500 MG tablet Take 1 tablet (500 mg total) by mouth 2 (two) times daily. As needed 07/16/19  Yes Mar Daring, PA-C    Allergies as of 07/12/2020 - Review Complete 07/12/2020  Allergen Reaction Noted  . Latex Itching 06/28/2015   . Neosporin [neomycin-bacitracin zn-polymyx]  06/28/2015  . Prednisone Swelling 07/06/2019  . Sulfa antibiotics Other (See Comments) 06/28/2015  . Benzalkonium chloride Itching and Rash 05/23/2016    Family History  Problem Relation Age of Onset  . Arthritis Mother   . Hyperlipidemia Mother   . Hypertension Mother   . Alzheimer's disease Mother   . Diabetes Father   . CVA Father   . HIV Brother   . Breast cancer Neg Hx     Social History   Socioeconomic History  . Marital status: Married    Spouse name: Merry Proud  . Number of children: 1  . Years of education: business college  . Highest education level: Associate degree: occupational, Hotel manager, or vocational program  Occupational History  . Occupation: Retired  Tobacco Use  . Smoking status: Former Smoker    Quit date: 06/16/1969    Years since quitting: 51.1  . Smokeless tobacco: Never Used  Vaping Use  . Vaping Use: Never used  Substance and Sexual Activity  . Alcohol use: Yes    Alcohol/week: 4.0 - 6.0 standard drinks    Types: 4 - 6 Glasses of wine per week  . Drug use: No  . Sexual activity: Not on file  Other Topics Concern  . Not on file  Social History Narrative  . Not on file   Social Determinants of Health   Financial Resource Strain: Low  Risk   . Difficulty of Paying Living Expenses: Not hard at all  Food Insecurity: No Food Insecurity  . Worried About Charity fundraiser in the Last Year: Never true  . Ran Out of Food in the Last Year: Never true  Transportation Needs: No Transportation Needs  . Lack of Transportation (Medical): No  . Lack of Transportation (Non-Medical): No  Physical Activity: Inactive  . Days of Exercise per Week: 0 days  . Minutes of Exercise per Session: 0 min  Stress: No Stress Concern Present  . Feeling of Stress : Not at all  Social Connections: Moderately Integrated  . Frequency of Communication with Friends and Family: More than three times a week  . Frequency of  Social Gatherings with Friends and Family: More than three times a week  . Attends Religious Services: More than 4 times per year  . Active Member of Clubs or Organizations: No  . Attends Archivist Meetings: Never  . Marital Status: Married  Human resources officer Violence: Not At Risk  . Fear of Current or Ex-Partner: No  . Emotionally Abused: No  . Physically Abused: No  . Sexually Abused: No    Review of Systems: See HPI, otherwise negative ROS  Physical Exam: BP 136/80   Pulse 64   Temp (!) 97.4 F (36.3 C) (Temporal)   Resp 16   Ht 5\' 6"  (1.676 m)   Wt 64.4 kg   SpO2 100%   BMI 22.92 kg/m  General:   Alert,  pleasant and cooperative in NAD Head:  Normocephalic and atraumatic. Neck:  Supple; no masses or thyromegaly. Lungs:  Clear throughout to auscultation.    Heart:  Regular rate and rhythm. Abdomen:  Soft, nontender and nondistended. Normal bowel sounds, without guarding, and without rebound.   Neurologic:  Alert and  oriented x4;  grossly normal neurologically.  Impression/Plan: Diana Blanchard is here for an colonoscopy to be performed for a history of adenomatous polyps on 06/2015   Risks, benefits, limitations, and alternatives regarding  colonoscopy have been reviewed with the patient.  Questions have been answered.  All parties agreeable.   Lucilla Lame, MD  07/27/2020, 7:47 AM

## 2020-07-27 NOTE — Anesthesia Preprocedure Evaluation (Signed)
Anesthesia Evaluation  Patient identified by MRN, date of birth, ID band Patient awake    Reviewed: Allergy & Precautions, NPO status , Patient's Chart, lab work & pertinent test results  Airway Mallampati: II  TM Distance: >3 FB     Dental   Pulmonary neg recent URI, former smoker,    breath sounds clear to auscultation       Cardiovascular hypertension, + Valvular Problems/Murmurs MVP  Rhythm:Regular Rate:Normal  HLD   Neuro/Psych  Headaches, Anxiety Depression    GI/Hepatic GERD  ,  Endo/Other  Hypothyroidism   Renal/GU      Musculoskeletal  (+) Arthritis ,   Abdominal   Peds  Hematology   Anesthesia Other Findings   Reproductive/Obstetrics                             Anesthesia Physical Anesthesia Plan  ASA: II  Anesthesia Plan: General   Post-op Pain Management:    Induction: Intravenous  PONV Risk Score and Plan: Propofol infusion, TIVA and Treatment may vary due to age or medical condition  Airway Management Planned: Natural Airway and Nasal Cannula  Additional Equipment:   Intra-op Plan:   Post-operative Plan:   Informed Consent: I have reviewed the patients History and Physical, chart, labs and discussed the procedure including the risks, benefits and alternatives for the proposed anesthesia with the patient or authorized representative who has indicated his/her understanding and acceptance.       Plan Discussed with: CRNA  Anesthesia Plan Comments:         Anesthesia Quick Evaluation

## 2020-07-27 NOTE — Op Note (Signed)
[facility] Patient Name: Diana Blanchard Procedure Date: 07/27/2020 8:31 AM MRN: [id number] Account #: [id number] Date of Birth: 01-Nov-1945 Admit Type: Outpatient Age: 75 Room: [facility] OR ROOM 01 Gender: Female Note Status: Finalized Procedure:             Colonoscopy Indications:           High risk colon cancer surveillance: Personal history                         of colonic polyps Providers:             Lucilla Lame MD, MD Referring MD:          Mar Daring (Referring MD) Medicines:             Propofol per Anesthesia Complications:         No immediate complications. Procedure:             Pre-Anesthesia Assessment:                        - Prior to the procedure, a History and Physical was                         performed, and patient medications and allergies were                         reviewed. The patient's tolerance of previous                         anesthesia was also reviewed. The risks and benefits                         of the procedure and the sedation options and risks                         were discussed with the patient. All questions were                         answered, and informed consent was obtained. Prior                         Anticoagulants: The patient has taken no previous                         anticoagulant or antiplatelet agents. ASA Grade                         Assessment: II - A patient with mild systemic disease.                         After reviewing the risks and benefits, the patient                         was deemed in satisfactory condition to undergo the                         procedure.  After obtaining informed consent, the colonoscope was                         passed under direct vision. Throughout the procedure,                         the patient's blood pressure, pulse, and oxygen                         saturations were monitored continuously. The                          Colonoscope was introduced through the anus and                         advanced to the the cecum, identified by appendiceal                         orifice and ileocecal valve. The colonoscopy was                         performed without difficulty. The patient tolerated                         the procedure well. The quality of the bowel                         preparation was excellent. Findings:      The perianal and digital rectal examinations were normal.      A 2 mm polyp was found in the descending colon. The polyp was sessile.       The polyp was removed with a cold biopsy forceps. Resection and       retrieval were complete.      Non-bleeding internal hemorrhoids were found during retroflexion. The       hemorrhoids were Grade I (internal hemorrhoids that do not prolapse). Impression:            - One 2 mm polyp in the descending colon, removed with                         a cold biopsy forceps. Resected and retrieved.                        - Non-bleeding internal hemorrhoids. Recommendation:        - Discharge patient to home.                        - Resume previous diet.                        - Continue present medications.                        - Await pathology results. Procedure Code(s):     --- Professional ---                        [id number], Colonoscopy, flexible; with biopsy, single or  multiple Diagnosis Code(s):     --- Professional ---                        Z86.010, Personal history of colonic polyps                        K63.5, Polyp of colon CPT copyright 2019 American Medical Association. All rights reserved. The codes documented in this report are preliminary and upon coder review may  be revised to meet current compliance requirements. Lucilla Lame MD, MD 07/27/2020 9:03:25 AM This report has been signed electronically. Number of Addenda: 0 Note Initiated On: 07/27/2020 8:31 AM Scope Withdrawal Time: 0 hours 8 minutes  30 seconds  Total Procedure Duration: 0 hours 16 minutes 56 seconds  Estimated Blood Loss:  Estimated blood loss: none.      [facility]

## 2020-07-27 NOTE — Anesthesia Procedure Notes (Signed)
Performed by: Chandani Rogowski, CRNA Pre-anesthesia Checklist: Patient identified, Emergency Drugs available, Suction available, Timeout performed and Patient being monitored Patient Re-evaluated:Patient Re-evaluated prior to induction Oxygen Delivery Method: Nasal cannula Placement Confirmation: positive ETCO2       

## 2020-07-28 ENCOUNTER — Encounter: Payer: Self-pay | Admitting: Physician Assistant

## 2020-07-28 ENCOUNTER — Ambulatory Visit (INDEPENDENT_AMBULATORY_CARE_PROVIDER_SITE_OTHER): Payer: Medicare Other | Admitting: Physician Assistant

## 2020-07-28 VITALS — BP 104/71 | HR 60 | Temp 98.6°F | Resp 16 | Ht 66.0 in | Wt 144.4 lb

## 2020-07-28 DIAGNOSIS — R002 Palpitations: Secondary | ICD-10-CM | POA: Diagnosis not present

## 2020-07-28 DIAGNOSIS — E7800 Pure hypercholesterolemia, unspecified: Secondary | ICD-10-CM

## 2020-07-28 DIAGNOSIS — E875 Hyperkalemia: Secondary | ICD-10-CM | POA: Diagnosis not present

## 2020-07-28 DIAGNOSIS — F331 Major depressive disorder, recurrent, moderate: Secondary | ICD-10-CM | POA: Diagnosis not present

## 2020-07-28 DIAGNOSIS — Z0000 Encounter for general adult medical examination without abnormal findings: Secondary | ICD-10-CM | POA: Diagnosis not present

## 2020-07-28 LAB — SURGICAL PATHOLOGY

## 2020-07-28 NOTE — Patient Instructions (Signed)
Preventive Care 74 Years and Older, Female Preventive care refers to lifestyle choices and visits with your health care provider that can promote health and wellness. This includes:  A yearly physical exam. This is also called an annual wellness visit.  Regular dental and eye exams.  Immunizations.  Screening for certain conditions.  Healthy lifestyle choices, such as: ? Eating a healthy diet. ? Getting regular exercise. ? Not using drugs or products that contain nicotine and tobacco. ? Limiting alcohol use. What can I expect for my preventive care visit? Physical exam Your health care provider will check your:  Height and weight. These may be used to calculate your BMI (body mass index). BMI is a measurement that tells if you are at a healthy weight.  Heart rate and blood pressure.  Body temperature.  Skin for abnormal spots. Counseling Your health care provider may ask you questions about your:  Past medical problems.  Family's medical history.  Alcohol, tobacco, and drug use.  Emotional well-being.  Home life and relationship well-being.  Sexual activity.  Diet, exercise, and sleep habits.  History of falls.  Memory and ability to understand (cognition).  Work and work Statistician.  Pregnancy and menstrual history.  Access to firearms. What immunizations do I need? Vaccines are usually given at various ages, according to a schedule. Your health care provider will recommend vaccines for you based on your age, medical history, and lifestyle or other factors, such as travel or where you work.   What tests do I need? Blood tests  Lipid and cholesterol levels. These may be checked every 5 years, or more often depending on your overall health.  Hepatitis C test.  Hepatitis B test. Screening  Lung cancer screening. You may have this screening every year starting at age 74 if you have a 30-pack-year history of smoking and currently smoke or have quit within  the past 15 years.  Colorectal cancer screening. ? All adults should have this screening starting at age 74 and continuing until age 58. ? Your health care provider may recommend screening at age 74 if you are at increased risk. ? You will have tests every 1-10 years, depending on your results and the type of screening test.  Diabetes screening. ? This is done by checking your blood sugar (glucose) after you have not eaten for a while (fasting). ? You may have this done every 1-3 years.  Mammogram. ? This may be done every 1-2 years. ? Talk with your health care provider about how often you should have regular mammograms.  Abdominal aortic aneurysm (AAA) screening. You may need this if you are a current or former smoker.  BRCA-related cancer screening. This may be done if you have a family history of breast, ovarian, tubal, or peritoneal cancers. Other tests  STD (sexually transmitted disease) testing, if you are at risk.  Bone density scan. This is done to screen for osteoporosis. You may have this done starting at age 74. Talk with your health care provider about your test results, treatment options, and if necessary, the need for more tests. Follow these instructions at home: Eating and drinking  Eat a diet that includes fresh fruits and vegetables, whole grains, lean protein, and low-fat dairy products. Limit your intake of foods with high amounts of sugar, saturated fats, and salt.  Take vitamin and mineral supplements as recommended by your health care provider.  Do not drink alcohol if your health care provider tells you not to drink.  If you drink alcohol: ? Limit how much you have to 0-1 drink a day. ? Be aware of how much alcohol is in your drink. In the [place] one drink equals one 12 oz bottle of beer (355 mL), one 5 oz glass of wine (148 mL), or one 1 oz glass of hard liquor (44 mL).   Lifestyle  Take daily care of your teeth and gums. Brush your teeth every morning  and night with fluoride toothpaste. Floss one time each day.  Stay active. Exercise for at least 30 minutes 5 or more days each week.  Do not use any products that contain nicotine or tobacco, such as cigarettes, e-cigarettes, and chewing tobacco. If you need help quitting, ask your health care provider.  Do not use drugs.  If you are sexually active, practice safe sex. Use a condom or other form of protection in order to prevent STIs (sexually transmitted infections).  Talk with your health care provider about taking a low-dose aspirin or statin.  Find healthy ways to cope with stress, such as: ? Meditation, yoga, or listening to music. ? Journaling. ? Talking to a trusted person. ? Spending time with friends and family. Safety  Always wear your seat belt while driving or riding in a vehicle.  Do not drive: ? If you have been drinking alcohol. Do not ride with someone who has been drinking. ? When you are tired or distracted. ? While texting.  Wear a helmet and other protective equipment during sports activities.  If you have firearms in your house, make sure you follow all gun safety procedures. What's next?  Visit your health care provider once a year for an annual wellness visit.  Ask your health care provider how often you should have your eyes and teeth checked.  Stay up to date on all vaccines. This information is not intended to replace advice given to you by your health care provider. Make sure you discuss any questions you have with your health care provider. Document Revised: 05/24/2020 Document Reviewed: 05/28/2018 Elsevier Patient Education  2021 Elsevier Inc.  

## 2020-07-28 NOTE — Progress Notes (Signed)
Complete physical exam   Patient: Diana Blanchard   DOB: [birth year]   75 y.o. Female  MRN: [id number] Visit Date: 07/28/2020  Today's healthcare provider: Mar Daring, PA-C   Chief Complaint  Patient presents with  . Annual Exam   Subjective    Diana Blanchard is a 75 y.o. female who presents today for a complete physical exam.  She reports consuming a general diet. The patient does not participate in regular exercise at present. She generally feels well. She reports sleeping well. She does have additional problems to discuss today.  HPI  07/17/2020 Mammogram-BI-RADS 1 07/22/2017 BMD-Osteopenia 07/27/2020 Colonoscopy-polyp, and non-bleeding internal hemorrhoids   Past Medical History:  Diagnosis Date  . Anxiety   . Arthritis    fingers  . Atypical mole   . Billowing mitral valve   . Carpal tunnel syndrome   . Depression   . GERD (gastroesophageal reflux disease)   . Headache   . Hypercholesterolemia   . Mitral valve prolapse   . Varicose vein of leg    Past Surgical History:  Procedure Laterality Date  . bowel obstruction  1990  . BREAST CYST ASPIRATION Right 1980's  . COLONOSCOPY WITH PROPOFOL N/A 06/29/2015   Procedure: COLONOSCOPY WITH PROPOFOL;  Surgeon: Manya Silvas, MD;  Location: [facility] ENDOSCOPY;  Service: Endoscopy;  Laterality: N/A;  . COLONOSCOPY WITH PROPOFOL N/A 07/27/2020   Procedure: COLONOSCOPY WITH PROPOFOL;  Surgeon: Lucilla Lame, MD;  Location: [place];  Service: Endoscopy;  Laterality: N/A;  priority 4  . cyst removed  1981   on the ovary  . LAPAROSCOPY  6160   complications from IUD  . POLYPECTOMY N/A 07/27/2020   Procedure: POLYPECTOMY;  Surgeon: Lucilla Lame, MD;  Location: [place];  Service: Endoscopy;  Laterality: N/A;  . ROTATOR CUFF REPAIR Right 06/2011  . TONSILLECTOMY AND ADENOIDECTOMY  1953   Social History   Socioeconomic History  . Marital status: Married    Spouse name: Diana Blanchard  . Number of children:  1  . Years of education: business college  . Highest education level: Associate degree: occupational, Hotel manager, or vocational program  Occupational History  . Occupation: Retired  Tobacco Use  . Smoking status: Former Smoker    Quit date: 06/16/1969    Years since quitting: 51.1  . Smokeless tobacco: Never Used  Vaping Use  . Vaping Use: Never used  Substance and Sexual Activity  . Alcohol use: Yes    Alcohol/week: 4.0 - 6.0 standard drinks    Types: 4 - 6 Glasses of wine per week  . Drug use: No  . Sexual activity: Not on file  Other Topics Concern  . Not on file  Social History Narrative  . Not on file   Social Determinants of Health   Financial Resource Strain: Low Risk   . Difficulty of Paying Living Expenses: Not hard at all  Food Insecurity: No Food Insecurity  . Worried About Charity fundraiser in the Last Year: Never true  . Ran Out of Food in the Last Year: Never true  Transportation Needs: No Transportation Needs  . Lack of Transportation (Medical): No  . Lack of Transportation (Non-Medical): No  Physical Activity: Inactive  . Days of Exercise per Week: 0 days  . Minutes of Exercise per Session: 0 min  Stress: No Stress Concern Present  . Feeling of Stress : Not at all  Social Connections: Moderately Integrated  . Frequency of Communication with  Friends and Family: More than three times a week  . Frequency of Social Gatherings with Friends and Family: More than three times a week  . Attends Religious Services: More than 4 times per year  . Active Member of Clubs or Organizations: No  . Attends Archivist Meetings: Never  . Marital Status: Married  Human resources officer Violence: Not At Risk  . Fear of Current or Ex-Partner: No  . Emotionally Abused: No  . Physically Abused: No  . Sexually Abused: No   Family Status  Relation Name Status  . Mother  Alive  . Father  Deceased at age 43       MI  . Brother  Deceased  . Neg Hx  (Not Specified)    Family History  Problem Relation Age of Onset  . Arthritis Mother   . Hyperlipidemia Mother   . Hypertension Mother   . Alzheimer's disease Mother   . Diabetes Father   . CVA Father   . HIV Brother   . Breast cancer Neg Hx    Allergies  Allergen Reactions  . Latex Itching  . Neosporin [Neomycin-Bacitracin Zn-Polymyx]   . Prednisone Swelling    And redness  . Sulfa Antibiotics Other (See Comments)  . Benzalkonium Chloride Itching and Rash    Patient Care Team: Mar Daring, PA-C as PCP - General (Family Medicine) Ralene Bathe, MD (Dermatology) Arelia Sneddon, [place] (Optometry) Lucky Cowboy Erskine Squibb, MD as Referring Physician (Vascular Surgery) Rosalene Billings., MD (Dentistry)   Medications: Outpatient Medications Prior to Visit  Medication Sig  . buPROPion (WELLBUTRIN XL) 150 MG 24 hr tablet Take 1 tablet (150 mg total) by mouth daily.  Marland Kitchen escitalopram (LEXAPRO) 20 MG tablet Take 1 tablet (20 mg total) by mouth daily.  . Multiple Vitamins-Minerals (WOMENS 50+ MULTI VITAMIN/MIN) TABS Take by mouth daily.  . simvastatin (ZOCOR) 20 MG tablet TAKE 1 TABLET(20 MG) BY MOUTH DAILY  . valACYclovir (VALTREX) 500 MG tablet Take 1 tablet (500 mg total) by mouth 2 (two) times daily. As needed   No facility-administered medications prior to visit.    Review of Systems  Constitutional: Negative.   HENT: Negative.   Eyes: Negative.   Respiratory: Negative.   Cardiovascular: Positive for palpitations.  Gastrointestinal: Negative.   Endocrine: Negative.   Genitourinary: Negative.   Musculoskeletal: Positive for myalgias.  Skin: Negative.   Allergic/Immunologic: Negative.   Neurological: Negative.   Hematological: Negative.   Psychiatric/Behavioral: Negative.     Last CBC Lab Results  Component Value Date   WBC 4.9 07/28/2019   HGB 13.3 07/28/2019   HCT 39.0 07/28/2019   MCV 97 07/28/2019   MCH 32.9 07/28/2019   RDW 12.0 07/28/2019   PLT 270 83/15/1761   Last  metabolic panel Lab Results  Component Value Date   GLUCOSE 91 07/28/2019   NA 145 (H) 07/28/2019   K 4.6 07/28/2019   CL 107 (H) 07/28/2019   CO2 24 07/28/2019   BUN 14 07/28/2019   CREATININE 1.04 (H) 07/28/2019   GFRNONAA 54 (L) 07/28/2019   GFRAA 62 07/28/2019   CALCIUM 9.7 07/28/2019   PROT 6.4 07/28/2019   ALBUMIN 4.3 07/28/2019   LABGLOB 2.1 07/28/2019   AGRATIO 2.0 07/28/2019   BILITOT 0.6 07/28/2019   ALKPHOS 78 07/28/2019   AST 20 07/28/2019   ALT 8 07/28/2019   Last lipids Lab Results  Component Value Date   CHOL 221 (H) 07/28/2019   HDL 74 07/28/2019  LDLCALC 131 (H) 07/28/2019   TRIG 90 07/28/2019   CHOLHDL 3.0 07/28/2019   Last thyroid functions Lab Results  Component Value Date   TSH 2.520 06/13/2016   Last vitamin D Lab Results  Component Value Date   VD25OH 38.3 08/11/2015      Objective    BP 104/71 (BP Location: Left Arm, Patient Position: Sitting, Cuff Size: Large)   Pulse 60   Temp 98.6 F (37 C) (Oral)   Resp 16   Ht 5\' 6"  (1.676 m)   Wt 144 lb 6.4 oz (65.5 kg)   BMI 23.31 kg/m  BP Readings from Last 3 Encounters:  07/28/20 104/71  07/27/20 108/69  05/16/20 137/78   Wt Readings from Last 3 Encounters:  07/28/20 144 lb 6.4 oz (65.5 kg)  07/27/20 142 lb (64.4 kg)  05/16/20 146 lb (66.2 kg)      Physical Exam Vitals reviewed.  Constitutional:      General: She is not in acute distress.    Appearance: Normal appearance. She is well-developed, normal weight and well-nourished. She is not ill-appearing or diaphoretic.  HENT:     Head: Normocephalic and atraumatic.     Right Ear: Tympanic membrane, ear canal and external ear normal.     Left Ear: Tympanic membrane, ear canal and external ear normal.     Nose: Nose normal.     Mouth/Throat:     Mouth: Oropharynx is clear and moist. Mucous membranes are moist.     Pharynx: Oropharynx is clear. No oropharyngeal exudate or posterior oropharyngeal erythema.  Eyes:     General:  No scleral icterus.       Right eye: No discharge.        Left eye: No discharge.     Extraocular Movements: Extraocular movements intact and EOM normal.     Conjunctiva/sclera: Conjunctivae normal.     Pupils: Pupils are equal, round, and reactive to light.  Neck:     Thyroid: No thyromegaly.     Vascular: No carotid bruit or JVD.     Trachea: No tracheal deviation.  Cardiovascular:     Rate and Rhythm: Normal rate and regular rhythm.     Pulses: Normal pulses and intact distal pulses.     Heart sounds: Normal heart sounds. No murmur heard. No friction rub. No gallop.   Pulmonary:     Effort: Pulmonary effort is normal. No respiratory distress.     Breath sounds: Normal breath sounds. No wheezing or rales.  Chest:     Chest wall: No tenderness.  Abdominal:     General: Abdomen is flat. Bowel sounds are normal. There is no distension.     Palpations: Abdomen is soft. There is no mass.     Tenderness: There is no abdominal tenderness. There is no guarding or rebound.  Musculoskeletal:        General: No tenderness or edema. Normal range of motion.     Cervical back: Normal range of motion and neck supple. No tenderness.     Right lower leg: No edema.     Left lower leg: No edema.  Lymphadenopathy:     Cervical: No cervical adenopathy.  Skin:    General: Skin is warm and dry.     Capillary Refill: Capillary refill takes less than 2 seconds.     Findings: No rash.  Neurological:     General: No focal deficit present.     Mental Status: She is alert and oriented  to person, place, and time. Mental status is at baseline.  Psychiatric:        Mood and Affect: Mood and affect and mood normal.        Behavior: Behavior normal.        Thought Content: Thought content normal.        Judgment: Judgment normal.      6CIT Screen 07/28/2020 07/06/2019 06/30/2018  What Year? 0 points 0 points 0 points  What month? 0 points 0 points 0 points  What time? 0 points 0 points 0 points  Count  back from 20 0 points 0 points 0 points  Months in reverse 0 points 0 points 0 points  Repeat phrase 0 points 0 points 0 points  Total Score 0 0 0   Last depression screening scores PHQ 2/9 Scores 07/28/2020 07/10/2020 01/13/2020  PHQ - 2 Score 1 1 3   PHQ- 9 Score 1 - 5   Last fall risk screening Fall Risk  07/10/2020  Falls in the past year? 0  Number falls in past yr: 0  Injury with Fall? 0   Last Audit-C alcohol use screening Alcohol Use Disorder Test (AUDIT) 07/10/2020  1. How often do you have a drink containing alcohol? 3  2. How many drinks containing alcohol do you have on a typical day when you are drinking? 0  3. How often do you have six or more drinks on one occasion? 0  AUDIT-C Score 3  4. How often during the last year have you found that you were not able to stop drinking once you had started? 0  5. How often during the last year have you failed to do what was normally expected from you because of drinking? 0  6. How often during the last year have you needed a first drink in the morning to get yourself going after a heavy drinking session? 0  7. How often during the last year have you had a feeling of guilt of remorse after drinking? 0  8. How often during the last year have you been unable to remember what happened the night before because you had been drinking? 0  9. Have you or someone else been injured as a result of your drinking? 0  10. Has a relative or friend or a doctor or another health worker been concerned about your drinking or suggested you cut down? 0  Alcohol Use Disorder Identification Test Final Score (AUDIT) 3  Alcohol Brief Interventions/Follow-up AUDIT Score <7 follow-up not indicated   A score of 3 or more in women, and 4 or more in men indicates increased risk for alcohol abuse, EXCEPT if all of the points are from question 1   No results found for any visits on 07/28/20.  Assessment & Plan    Routine Health Maintenance and Physical  Exam  Exercise Activities and Dietary recommendations Goals    . Exercise 3x per week (30 min per time)     Recommend to exercise for 3 days a week for at least 30 minutes at a time.        Immunization History  Administered Date(s) Administered  . Fluad Quad(high Dose 65+) 03/17/2019, 03/23/2020  . Influenza Split 04/03/2009, 03/01/2010, 03/08/2011, 04/06/2012  . Influenza, High Dose Seasonal PF 04/19/2014, 04/05/2015, 04/11/2016, 03/26/2017, 04/08/2018  . Influenza,inj,Quad PF,6+ Mos 04/07/2013  . PFIZER(Purple Top)SARS-COV-2 Vaccination 06/21/2019, 07/12/2019, 04/20/2020  . Pneumococcal Conjugate-13 06/03/2014  . Pneumococcal Polysaccharide-23 06/17/1998, 04/07/2013  . Tdap 04/09/2005, 06/02/2015  .  Zoster 01/13/2008  . Zoster Recombinat (Shingrix) 12/02/2017, 04/07/2018    Health Maintenance  Topic Date Due  . COVID-19 Vaccine (4 - Booster for Pfizer series) 10/18/2020  . MAMMOGRAM  07/17/2022  . DEXA SCAN  07/22/2022  . TETANUS/TDAP  06/01/2025  . COLONOSCOPY (Pts 45-32yrs Insurance coverage will need to be confirmed)  07/27/2025  . INFLUENZA VACCINE  Completed  . Hepatitis C Screening  Completed  . PNA vac Low Risk Adult  Completed    Discussed health benefits of physical activity, and encouraged her to engage in regular exercise appropriate for her age and condition.  1. Annual physical exam Normal physical exam today. Will check labs as below and f/u pending lab results. If labs are stable and WNL she will not need to have these rechecked for one year at her next annual physical exam. She is to call the office in the meantime if she has any acute issue, questions or concerns. - Comprehensive metabolic panel - Lipid Panel With LDL/HDL Ratio - CBC with Differential/Platelet - TSH - HgB A1c  2. Hypercholesterolemia Stable. Continue Simvastatin 20mg . Will check labs as below and f/u pending results. - Lipid Panel With LDL/HDL Ratio  3. High potassium H/O this.  Will check labs as below and f/u pending results. - Comprehensive metabolic panel  4. Palpitations Patient reports noticing increased palpitations. EKG today shows Sinus bradycardia rate of 53 with low voltage of precordial leads. No ST segment changes. Essentially unchanged from 2017, personally reviewed by me.  - TSH - EKG 12-Lead  5. Moderate episode of recurrent major depressive disorder (HCC) Stable. Continue Lexapro 20mg  daily and wellbutrin XL 150mg  daily.    No follow-ups on file.     Reynolds Bowl, PA-C, have reviewed all documentation for this visit. The documentation on 08/13/20 for the exam, diagnosis, procedures, and orders are all accurate and complete.   Rubye Beach  [facility] [phone] (phone) [phone] (fax)  [place]

## 2020-07-29 ENCOUNTER — Encounter: Payer: Self-pay | Admitting: Gastroenterology

## 2020-07-29 ENCOUNTER — Other Ambulatory Visit: Payer: Self-pay | Admitting: Physician Assistant

## 2020-07-29 DIAGNOSIS — B009 Herpesviral infection, unspecified: Secondary | ICD-10-CM

## 2020-08-04 DIAGNOSIS — Z0000 Encounter for general adult medical examination without abnormal findings: Secondary | ICD-10-CM | POA: Diagnosis not present

## 2020-08-04 DIAGNOSIS — E7800 Pure hypercholesterolemia, unspecified: Secondary | ICD-10-CM | POA: Diagnosis not present

## 2020-08-04 DIAGNOSIS — R002 Palpitations: Secondary | ICD-10-CM | POA: Diagnosis not present

## 2020-08-04 DIAGNOSIS — E875 Hyperkalemia: Secondary | ICD-10-CM | POA: Diagnosis not present

## 2020-08-05 LAB — LIPID PANEL WITH LDL/HDL RATIO
Cholesterol, Total: 223 mg/dL — ABNORMAL HIGH (ref 100–199)
HDL: 77 mg/dL (ref >39)
LDL Chol Calc (NIH): 133 mg/dL — ABNORMAL HIGH (ref 0–99)
LDL/HDL Ratio: 1.7 ratio (ref 0.0–3.2)
Triglycerides: 77 mg/dL (ref 0–149)
VLDL Cholesterol Cal: 13 mg/dL (ref 5–40)

## 2020-08-05 LAB — COMPREHENSIVE METABOLIC PANEL
ALT: 7 IU/L (ref 0–32)
AST: 19 IU/L (ref 0–40)
Albumin/Globulin Ratio: 2.1 (ref 1.2–2.2)
Albumin: 4.5 g/dL (ref 3.7–4.7)
Alkaline Phosphatase: 74 IU/L (ref 44–121)
BUN/Creatinine Ratio: 13 (ref 12–28)
BUN: 13 mg/dL (ref 8–27)
Bilirubin Total: 0.6 mg/dL (ref 0.0–1.2)
CO2: 23 mmol/L (ref 20–29)
Calcium: 9.5 mg/dL (ref 8.7–10.3)
Chloride: 104 mmol/L (ref 96–106)
Creatinine, Ser: 1.01 mg/dL — ABNORMAL HIGH (ref 0.57–1.00)
GFR calc Af Amer: 64 mL/min/{1.73_m2} (ref >59)
GFR calc non Af Amer: 55 mL/min/{1.73_m2} — ABNORMAL LOW (ref >59)
Globulin, Total: 2.1 g/dL (ref 1.5–4.5)
Glucose: 88 mg/dL (ref 65–99)
Potassium: 4.9 mmol/L (ref 3.5–5.2)
Sodium: 141 mmol/L (ref 134–144)
Total Protein: 6.6 g/dL (ref 6.0–8.5)

## 2020-08-05 LAB — CBC WITH DIFFERENTIAL/PLATELET
Basophils Absolute: 0 10*3/uL (ref 0.0–0.2)
Basos: 1 %
EOS (ABSOLUTE): 0.2 10*3/uL (ref 0.0–0.4)
Eos: 3 %
Hematocrit: 38.9 % (ref 34.0–46.6)
Hemoglobin: 13.1 g/dL (ref 11.1–15.9)
Immature Grans (Abs): 0 10*3/uL (ref 0.0–0.1)
Immature Granulocytes: 0 %
Lymphocytes Absolute: 1.5 10*3/uL (ref 0.7–3.1)
Lymphs: 22 %
MCH: 32.8 pg (ref 26.6–33.0)
MCHC: 33.7 g/dL (ref 31.5–35.7)
MCV: 98 fL — ABNORMAL HIGH (ref 79–97)
Monocytes Absolute: 0.6 10*3/uL (ref 0.1–0.9)
Monocytes: 9 %
Neutrophils Absolute: 4.3 10*3/uL (ref 1.4–7.0)
Neutrophils: 65 %
Platelets: 279 10*3/uL (ref 150–450)
RBC: 3.99 x10E6/uL (ref 3.77–5.28)
RDW: 12.5 % (ref 11.7–15.4)
WBC: 6.6 10*3/uL (ref 3.4–10.8)

## 2020-08-05 LAB — HEMOGLOBIN A1C
Est. average glucose Bld gHb Est-mCnc: 117 mg/dL
Hgb A1c MFr Bld: 5.7 % — ABNORMAL HIGH (ref 4.8–5.6)

## 2020-08-05 LAB — TSH: TSH: 1.82 u[IU]/mL (ref 0.450–4.500)

## 2020-08-13 ENCOUNTER — Encounter: Payer: Self-pay | Admitting: Physician Assistant

## 2020-08-22 ENCOUNTER — Other Ambulatory Visit (INDEPENDENT_AMBULATORY_CARE_PROVIDER_SITE_OTHER): Payer: Medicare Other | Admitting: Vascular Surgery

## 2020-08-28 ENCOUNTER — Encounter (INDEPENDENT_AMBULATORY_CARE_PROVIDER_SITE_OTHER): Payer: Medicare Other

## 2020-09-01 ENCOUNTER — Encounter (INDEPENDENT_AMBULATORY_CARE_PROVIDER_SITE_OTHER): Payer: Self-pay | Admitting: Vascular Surgery

## 2020-09-01 ENCOUNTER — Other Ambulatory Visit: Payer: Self-pay

## 2020-09-01 ENCOUNTER — Ambulatory Visit (INDEPENDENT_AMBULATORY_CARE_PROVIDER_SITE_OTHER): Payer: Medicare Other | Admitting: Vascular Surgery

## 2020-09-01 DIAGNOSIS — I83811 Varicose veins of right lower extremities with pain: Secondary | ICD-10-CM | POA: Insufficient documentation

## 2020-09-01 NOTE — Progress Notes (Signed)
Diana Blanchard is a 75 y.o.female who presents with painful varicose veins of the right leg  Past Medical History:  Diagnosis Date  . Anxiety   . Arthritis    fingers  . Billowing mitral valve   . Carpal tunnel syndrome   . Depression   . Dysplastic nevus 05/20/2006   R lat buttock - moderate  . Dysplastic nevus 12/31/2006   R back braline - moderate, excised 04/07/2007  . Dysplastic nevus 10/06/2008   R back paraspinal med to inf scapula - mild to mod  . GERD (gastroesophageal reflux disease)   . Headache   . Hypercholesterolemia   . Mitral valve prolapse   . Varicose vein of leg     Past Surgical History:  Procedure Laterality Date  . bowel obstruction  1990  . BREAST CYST ASPIRATION Right 1980's  . COLONOSCOPY WITH PROPOFOL N/A 06/29/2015   Procedure: COLONOSCOPY WITH PROPOFOL;  Surgeon: Manya Silvas, MD;  Location: [facility] ENDOSCOPY;  Service: Endoscopy;  Laterality: N/A;  . COLONOSCOPY WITH PROPOFOL N/A 07/27/2020   Procedure: COLONOSCOPY WITH PROPOFOL;  Surgeon: Lucilla Lame, MD;  Location: [place];  Service: Endoscopy;  Laterality: N/A;  priority 4  . cyst removed  1981   on the ovary  . LAPAROSCOPY  0254   complications from IUD  . POLYPECTOMY N/A 07/27/2020   Procedure: POLYPECTOMY;  Surgeon: Lucilla Lame, MD;  Location: [place];  Service: Endoscopy;  Laterality: N/A;  . ROTATOR CUFF REPAIR Right 06/2011  . TONSILLECTOMY AND ADENOIDECTOMY  1953    Current Outpatient Medications  Medication Sig Dispense Refill  . buPROPion (WELLBUTRIN XL) 150 MG 24 hr tablet Take 1 tablet (150 mg total) by mouth daily. 90 tablet 1  . escitalopram (LEXAPRO) 20 MG tablet Take 1 tablet (20 mg total) by mouth daily. 90 tablet 1  . Multiple Vitamins-Minerals (WOMENS 50+ MULTI VITAMIN/MIN) TABS Take by mouth daily.    . simvastatin (ZOCOR) 20 MG tablet TAKE 1 TABLET(20 MG) BY MOUTH DAILY 90 tablet 0  . valACYclovir (VALTREX) 500 MG tablet TAKE 1 TABLET(500 MG) BY  MOUTH TWICE DAILY AS NEEDED 30 tablet 5  . ALPRAZolam (XANAX) 0.5 MG tablet SMARTSIG:1 Tablet(s) By Mouth     No current facility-administered medications for this visit.    Allergies  Allergen Reactions  . Latex Itching  . Neosporin [Neomycin-Bacitracin Zn-Polymyx]   . Prednisone Swelling    And redness  . Sulfa Antibiotics Other (See Comments)  . Benzalkonium Chloride Itching and Rash    Indication: Patient presents with symptomatic varicose veins of the right lower extremity.  Procedure: We initially had planned to do a laser today.  Once we put the ultrasound probe on, the segments of residual saphenous vein were relatively short and small and not really amenable to laser ablation.  The 2 largest veins in the leg were large saphenous vein branches that would also not be amenable to laser ablation due to their superficial nature and tortuosity.  We elected to proceed with foam sclerotherapy to these areas.  Foam sclerotherapy was performed on the right lower extremity. Using ultrasound guidance, 5 mL of foam Sotradecol was used to inject the varicosities of the right lower extremity. Compression wraps were placed. The patient tolerated the procedure well.

## 2020-09-04 ENCOUNTER — Telehealth: Payer: Self-pay

## 2020-09-04 ENCOUNTER — Encounter (INDEPENDENT_AMBULATORY_CARE_PROVIDER_SITE_OTHER): Payer: Medicare Other

## 2020-09-04 DIAGNOSIS — F331 Major depressive disorder, recurrent, moderate: Secondary | ICD-10-CM

## 2020-09-04 NOTE — Telephone Encounter (Signed)
[facility] faxed refill request for the following medications:  buPROPion (WELLBUTRIN XL) 150 MG 24 hr tablet   Please advise.  

## 2020-09-05 MED ORDER — BUPROPION HCL ER (XL) 150 MG PO TB24: 150.0000 mg | ORAL_TABLET | Freq: Every day | ORAL | 1 refills | Status: DC

## 2020-09-27 ENCOUNTER — Ambulatory Visit: Payer: Medicare Other | Admitting: Dermatology

## 2020-09-27 ENCOUNTER — Other Ambulatory Visit: Payer: Self-pay

## 2020-09-27 ENCOUNTER — Encounter: Payer: Self-pay | Admitting: Dermatology

## 2020-09-27 DIAGNOSIS — Z1283 Encounter for screening for malignant neoplasm of skin: Secondary | ICD-10-CM

## 2020-09-27 DIAGNOSIS — D1800 Hemangioma unspecified site: Secondary | ICD-10-CM

## 2020-09-27 DIAGNOSIS — Z86018 Personal history of other benign neoplasm: Secondary | ICD-10-CM | POA: Diagnosis not present

## 2020-09-27 DIAGNOSIS — L814 Other melanin hyperpigmentation: Secondary | ICD-10-CM | POA: Diagnosis not present

## 2020-09-27 DIAGNOSIS — L578 Other skin changes due to chronic exposure to nonionizing radiation: Secondary | ICD-10-CM | POA: Diagnosis not present

## 2020-09-27 DIAGNOSIS — L820 Inflamed seborrheic keratosis: Secondary | ICD-10-CM | POA: Diagnosis not present

## 2020-09-27 DIAGNOSIS — L821 Other seborrheic keratosis: Secondary | ICD-10-CM

## 2020-09-27 DIAGNOSIS — D229 Melanocytic nevi, unspecified: Secondary | ICD-10-CM

## 2020-09-27 NOTE — Progress Notes (Signed)
   Follow-Up Visit   Subjective  Diana Blanchard is a 75 y.o. female who presents for the following: Annual Exam (Mole cheek, hx of Dysplastic nevus ). The patient presents for Total-Body Skin Exam (TBSE) for skin cancer screening and mole check.  The following portions of the chart were reviewed this encounter and updated as appropriate:   Tobacco  Allergies  Meds  Problems  Med Hx  Surg Hx  Fam Hx     Review of Systems:  No other skin or systemic complaints except as noted in HPI or Assessment and Plan.  Objective  Well appearing patient in no apparent distress; mood and affect are within normal limits.  A full examination was performed including scalp, head, eyes, ears, nose, lips, neck, chest, axillae, abdomen, back, buttocks, bilateral upper extremities, bilateral lower extremities, hands, feet, fingers, toes, fingernails, and toenails. All findings within normal limits unless otherwise noted below.  Objective  multiple see history: Scar with no evidence of recurrence.   Objective  Right periaxillary, left wrist (2): Erythematous keratotic or waxy stuck-on papule or plaque.    Assessment & Plan  History of dysplastic nevus multiple see history  Clear. Observe for recurrence. Call clinic for new or changing lesions.  Recommend regular skin exams, daily broad-spectrum spf 30+ sunscreen use, and photoprotection.     Inflamed seborrheic keratosis (2) Right periaxillary, left wrist  Destruction of lesion - Right periaxillary, left wrist Complexity: simple   Destruction method: cryotherapy   Informed consent: discussed and consent obtained   Timeout:  patient name, date of birth, surgical site, and procedure verified Lesion destroyed using liquid nitrogen: Yes   Region frozen until ice ball extended beyond lesion: Yes   Outcome: patient tolerated procedure well with no complications   Post-procedure details: wound care instructions given    Skin cancer screening    Lentigines - Scattered tan macules - Due to sun exposure - Benign-appering, observe - Recommend daily broad spectrum sunscreen SPF 30+ to sun-exposed areas, reapply every 2 hours as needed. - Call for any changes  Seborrheic Keratoses - Stuck-on, waxy, tan-brown papules and/or plaques  - Benign-appearing - Discussed benign etiology and prognosis. - Observe - Call for any changes  Melanocytic Nevi - Tan-brown and/or pink-flesh-colored symmetric macules and papules - Benign appearing on exam today - Observation - Call clinic for new or changing moles - Recommend daily use of broad spectrum spf 30+ sunscreen to sun-exposed areas.   Hemangiomas - Red papules - Discussed benign nature - Observe - Call for any changes  Actinic Damage - Chronic condition, secondary to cumulative UV/sun exposure - diffuse scaly erythematous macules with underlying dyspigmentation - Recommend daily broad spectrum sunscreen SPF 30+ to sun-exposed areas, reapply every 2 hours as needed.  - Staying in the shade or wearing long sleeves, sun glasses (UVA+UVB protection) and wide brim hats (4-inch brim around the entire circumference of the hat) are also recommended for sun protection.  - Call for new or changing lesions.  Skin cancer screening performed today.  Return in about 1 year (around 09/27/2021) for TBSE.  IMarye Round, CMA, am acting as scribe for Sarina Ser, MD .  Documentation: I have reviewed the above documentation for accuracy and completeness, and I agree with the above.  Sarina Ser, MD

## 2020-09-27 NOTE — Patient Instructions (Addendum)

## 2020-10-06 ENCOUNTER — Other Ambulatory Visit: Payer: Self-pay

## 2020-10-06 ENCOUNTER — Ambulatory Visit (INDEPENDENT_AMBULATORY_CARE_PROVIDER_SITE_OTHER): Payer: Medicare Other | Admitting: Vascular Surgery

## 2020-10-06 ENCOUNTER — Encounter (INDEPENDENT_AMBULATORY_CARE_PROVIDER_SITE_OTHER): Payer: Self-pay | Admitting: Vascular Surgery

## 2020-10-06 VITALS — BP 114/73 | HR 66 | Ht 66.0 in | Wt 144.0 lb

## 2020-10-06 DIAGNOSIS — E7800 Pure hypercholesterolemia, unspecified: Secondary | ICD-10-CM

## 2020-10-06 DIAGNOSIS — I83811 Varicose veins of right lower extremities with pain: Secondary | ICD-10-CM | POA: Diagnosis not present

## 2020-10-06 NOTE — Assessment & Plan Note (Signed)
The patient is undergone successful foam sclerotherapy of a large varicosity on the right lower extremity.  Anatomy was not amenable to any laser ablation.  She does have residual varicosities that would benefit from sclerotherapy on the right leg.  These varicosities cause her pain and discomfort on a daily basis despite appropriate use of compression stockings which she was actually wearing today.  These can be treated with sclerotherapy and the risks and benefits were discussed.  All questions were answered regarding the phone treatment from several weeks ago.

## 2020-10-06 NOTE — Assessment & Plan Note (Signed)
lipid control important in reducing the progression of atherosclerotic disease. Continue statin therapy  

## 2020-10-06 NOTE — Progress Notes (Signed)
MRN : [id number]  Diana Blanchard is a 75 y.o. (11/16/1945) female who presents with chief complaint of  Chief Complaint  Patient presents with  . Follow-up    Questions about failed laser prcedure  .  History of Present Illness: Patient returns today in follow up of her venous disease.  About a month ago, she underwent foam sclerotherapy of a large varicosity in the right medial thigh.  We had intention of laser ablation but at the time of the procedure, the residual native saphenous vein was quite small and short segment and the tortuous, superficial varicosities would not of been amenable to laser ablation.  Decision was made to perform foam sclerotherapy.  She still has some firm knots in these areas consistent with successful foam sclerotherapy.  She has some prominent residual varicosities in that right leg as well that are itching and burning.  She has soreness overlying the site of foam sclerotherapy.  We answered questions about the differences between laser ablation and foam sclerotherapy and why we performed foam sclerotherapy today.  Current Outpatient Medications  Medication Sig Dispense Refill  . ALPRAZolam (XANAX) 0.5 MG tablet SMARTSIG:1 Tablet(s) By Mouth    . buPROPion (WELLBUTRIN XL) 150 MG 24 hr tablet Take 1 tablet (150 mg total) by mouth daily. 90 tablet 1  . escitalopram (LEXAPRO) 20 MG tablet Take 1 tablet (20 mg total) by mouth daily. 90 tablet 1  . Multiple Vitamins-Minerals (WOMENS 50+ MULTI VITAMIN/MIN) TABS Take by mouth daily.    . simvastatin (ZOCOR) 20 MG tablet TAKE 1 TABLET(20 MG) BY MOUTH DAILY 90 tablet 0  . valACYclovir (VALTREX) 500 MG tablet TAKE 1 TABLET(500 MG) BY MOUTH TWICE DAILY AS NEEDED 30 tablet 5   No current facility-administered medications for this visit.    Past Medical History:  Diagnosis Date  . Anxiety   . Arthritis    fingers  . Billowing mitral valve   . Carpal tunnel syndrome   . Depression   . Dysplastic nevus 05/20/2006   R  lat buttock - moderate  . Dysplastic nevus 12/31/2006   R back braline - moderate, excised 04/07/2007  . Dysplastic nevus 10/06/2008   R back paraspinal med to inf scapula - mild to mod  . GERD (gastroesophageal reflux disease)   . Headache   . Hypercholesterolemia   . Mitral valve prolapse   . Varicose vein of leg     Past Surgical History:  Procedure Laterality Date  . bowel obstruction  1990  . BREAST CYST ASPIRATION Right 1980's  . COLONOSCOPY WITH PROPOFOL N/A 06/29/2015   Procedure: COLONOSCOPY WITH PROPOFOL;  Surgeon: Manya Silvas, MD;  Location: [facility] ENDOSCOPY;  Service: Endoscopy;  Laterality: N/A;  . COLONOSCOPY WITH PROPOFOL N/A 07/27/2020   Procedure: COLONOSCOPY WITH PROPOFOL;  Surgeon: Lucilla Lame, MD;  Location: [place];  Service: Endoscopy;  Laterality: N/A;  priority 4  . cyst removed  1981   on the ovary  . LAPAROSCOPY  9937   complications from IUD  . POLYPECTOMY N/A 07/27/2020   Procedure: POLYPECTOMY;  Surgeon: Lucilla Lame, MD;  Location: [place];  Service: Endoscopy;  Laterality: N/A;  . ROTATOR CUFF REPAIR Right 06/2011  . TONSILLECTOMY AND ADENOIDECTOMY  1953     Social History   Tobacco Use  . Smoking status: Former Smoker    Quit date: 06/16/1969    Years since quitting: 51.3  . Smokeless tobacco: Never Used  Vaping Use  . Vaping  Use: Never used  Substance Use Topics  . Alcohol use: Yes    Alcohol/week: 4.0 - 6.0 standard drinks    Types: 4 - 6 Glasses of wine per week  . Drug use: No      Family History  Problem Relation Age of Onset  . Arthritis Mother   . Hyperlipidemia Mother   . Hypertension Mother   . Alzheimer's disease Mother   . Diabetes Father   . CVA Father   . HIV Brother   . Breast cancer Neg Hx      Allergies  Allergen Reactions  . Latex Itching  . Neosporin [Neomycin-Bacitracin Zn-Polymyx]   . Prednisone Swelling    And redness  . Sulfa Antibiotics Other (See Comments)  .  Benzalkonium Chloride Itching and Rash     REVIEW OF SYSTEMS (Negative unless checked)  Constitutional: '[]' Weight loss  '[]' Fever  '[]' Chills Cardiac: '[]' Chest pain   '[]' Chest pressure   '[]' Palpitations   '[]' Shortness of breath when laying flat   '[]' Shortness of breath at rest   '[]' Shortness of breath with exertion. Vascular:  '[]' Pain in legs with walking   '[]' Pain in legs at rest   '[]' Pain in legs when laying flat   '[]' Claudication   '[]' Pain in feet when walking  '[]' Pain in feet at rest  '[]' Pain in feet when laying flat   '[]' History of DVT   '[]' Phlebitis   '[]' Swelling in legs   '[x]' Varicose veins   '[]' Non-healing ulcers Pulmonary:   '[]' Uses home oxygen   '[]' Productive cough   '[]' Hemoptysis   '[]' Wheeze  '[]' COPD   '[]' Asthma Neurologic:  '[]' Dizziness  '[]' Blackouts   '[]' Seizures   '[]' History of stroke   '[]' History of TIA  '[]' Aphasia   '[]' Temporary blindness   '[]' Dysphagia   '[]' Weakness or numbness in arms   '[]' Weakness or numbness in legs Musculoskeletal:  '[]' Arthritis   '[]' Joint swelling   '[]' Joint pain   '[]' Low back pain Hematologic:  '[]' Easy bruising  '[]' Easy bleeding   '[]' Hypercoagulable state   '[]' Anemic   Gastrointestinal:  '[]' Blood in stool   '[]' Vomiting blood  '[]' Gastroesophageal reflux/heartburn   '[]' Abdominal pain Genitourinary:  '[]' Chronic kidney disease   '[]' Difficult urination  '[]' Frequent urination  '[]' Burning with urination   '[]' Hematuria Skin:  '[]' Rashes   '[]' Ulcers   '[]' Wounds Psychological:  '[]' History of anxiety   '[]'  History of major depression.  Physical Examination  BP 114/73   Pulse 66   Ht '5\' 6"'  (1.676 m)   Wt 144 lb (65.3 kg)   BMI 23.24 kg/m  Gen:  WD/WN, NAD Head: [place]/AT, No temporalis wasting. Ear/Nose/Throat: Hearing grossly intact, nares w/o erythema or drainage Eyes: Conjunctiva clear. Sclera non-icteric Neck: Supple.  Trachea midline Pulmonary:  Good air movement, no use of accessory muscles.  Cardiac: RRR, no JVD Vascular: Firm indurated area treated with foam sclerotherapy several weeks ago in a typical fashion.   The redness has resolved, but there is still firm knots indicating occluded veins.  Relatively prominent varicosities are present in the right leg otherwise with some less prominent varicosities on the left. Vessel Right Left  Radial Palpable Palpable               Musculoskeletal: M/S 5/5 throughout.  No deformity or atrophy.  No significant lower extremity edema present today. Neurologic: Sensation grossly intact in extremities.  Symmetrical.  Speech is fluent.  Psychiatric: Judgment intact, Mood & affect appropriate for pt's clinical situation. Dermatologic: No rashes or ulcers noted.  No cellulitis or open wounds.  Labs Recent Results (from the past 2160 hour(s))  SARS CORONAVIRUS 2 (TAT 6-24 HRS) Nasopharyngeal Nasopharyngeal Swab     Status: None   Collection Time: 07/25/20 10:19 AM   Specimen: Nasopharyngeal Swab  Result Value Ref Range   SARS Coronavirus 2 NEGATIVE NEGATIVE    Comment: (NOTE) SARS-CoV-2 target nucleic acids are NOT DETECTED.  The SARS-CoV-2 RNA is generally detectable in upper and lower respiratory specimens during the acute phase of infection. Negative results do not preclude SARS-CoV-2 infection, do not rule out co-infections with other pathogens, and should not be used as the sole basis for treatment or other patient management decisions. Negative results must be combined with clinical observations, patient history, and epidemiological information. The expected result is Negative.  Fact Sheet for Patients: SugarRoll.be  Fact Sheet for Healthcare Providers: https://www.woods-mathews.com/  This test is not yet approved or cleared by the [place] FDA and  has been authorized for detection and/or diagnosis of SARS-CoV-2 by FDA under an Emergency Use Authorization (EUA). This EUA will remain  in effect (meaning this test can be used) for the duration of the COVID-19 declaration under Se ction  564(b)(1) of the Act, 21 U.S.C. section 360bbb-3(b)(1), unless the authorization is terminated or revoked sooner.  Performed at [facility], [place] [address] [place], [place] [address]   Surgical pathology     Status: None   Collection Time: 07/27/20  8:39 AM  Result Value Ref Range   SURGICAL PATHOLOGY      SURGICAL PATHOLOGY CASE: [id number] PATIENT: Daisy Lazar Surgical Pathology Report     Specimen Submitted: A. Colon polyp, descending; cbx  Clinical History: History of colon polyps, Colon polyp    DIAGNOSIS: A. COLON POLYP, DESCENDING; COLD BIOPSY: - POLYPOID FRAGMENT OF BENIGN COLONIC MUCOSA WITH SUPERFICIAL REACTIVE CHANGES AND SMALL LYMPHOID AGGREGATE. - NEGATIVE FOR DYSPLASIA AND MALIGNANCY.  Comment: Multiple additional deeper recut levels were examined.  GROSS DESCRIPTION: A. Labeled: Descending colon polyp (per requisition cold biopsy) Received: Formalin Collection time: 8:39 AM on 07/27/2020 Placed into formalin time: 8:39 AM on 07/27/2020 Tissue fragment(s): 1 Size: 0.4 x 0.2 x 0.1 cm Description: Tan soft tissue fragment Entirely submitted in 1 cassette.  Final Diagnosis performed by Allena Napoleon, MD.   Electronically signed 07/28/2020 1:30:40PM The electronic signature indicates that the named Attending Pathologist has evaluated the [place] men Technical component performed at [facility], [address] [place], [place] [address] Lab: [id number] Dir: Rush Farmer, MD, MMM  Professional component performed at [facility], [facility], [place], [place], [place] [address] Lab: [phone] Dir: Dellia Nims. Rubinas, MD   Comprehensive metabolic panel     Status: Abnormal   Collection Time: 08/04/20 11:34 AM  Result Value Ref Range   Glucose 88 65 - 99 mg/dL   BUN 13 8 - 27 mg/dL   Creatinine, Ser 1.01 (H) 0.57 - 1.00 mg/dL   GFR calc non Af Amer 55 (L) >59 mL/min/1.73   GFR calc Af Amer 64 >59 mL/min/1.73    Comment: **In  accordance with recommendations from the NKF-ASN Task force,**   [facility] is in the process of updating its eGFR calculation to the   2021 CKD-EPI creatinine equation that estimates kidney function   without a race variable.    BUN/Creatinine Ratio 13 12 - 28   Sodium 141 134 - 144 mmol/L   Potassium 4.9 3.5 - 5.2 mmol/L   Chloride 104 96 - 106 mmol/L   CO2 23 20 -  29 mmol/L   Calcium 9.5 8.7 - 10.3 mg/dL   Total Protein 6.6 6.0 - 8.5 g/dL   Albumin 4.5 3.7 - 4.7 g/dL   Globulin, Total 2.1 1.5 - 4.5 g/dL   Albumin/Globulin Ratio 2.1 1.2 - 2.2   Bilirubin Total 0.6 0.0 - 1.2 mg/dL   Alkaline Phosphatase 74 44 - 121 IU/L   AST 19 0 - 40 IU/L   ALT 7 0 - 32 IU/L  Lipid Panel With LDL/HDL Ratio     Status: Abnormal   Collection Time: 08/04/20 11:34 AM  Result Value Ref Range   Cholesterol, Total 223 (H) 100 - 199 mg/dL   Triglycerides 77 0 - 149 mg/dL   HDL 77 >39 mg/dL   VLDL Cholesterol Cal 13 5 - 40 mg/dL   LDL Chol Calc (NIH) 133 (H) 0 - 99 mg/dL   LDL/HDL Ratio 1.7 0.0 - 3.2 ratio    Comment:                                     LDL/HDL Ratio                                             Men  Women                               1/2 Avg.Risk  1.0    1.5                                   Avg.Risk  3.6    3.2                                2X Avg.Risk  6.2    5.0                                3X Avg.Risk  8.0    6.1   CBC with Differential/Platelet     Status: Abnormal   Collection Time: 08/04/20 11:34 AM  Result Value Ref Range   WBC 6.6 3.4 - 10.8 x10E3/uL   RBC 3.99 3.77 - 5.28 x10E6/uL   Hemoglobin 13.1 11.1 - 15.9 g/dL   Hematocrit 38.9 34.0 - 46.6 %   MCV 98 (H) 79 - 97 fL   MCH 32.8 26.6 - 33.0 pg   MCHC 33.7 31.5 - 35.7 g/dL   RDW 12.5 11.7 - 15.4 %   Platelets 279 150 - 450 x10E3/uL   Neutrophils 65 Not Estab. %   Lymphs 22 Not Estab. %   Monocytes 9 Not Estab. %   Eos 3 Not Estab. %   Basos 1 Not Estab. %   Neutrophils Absolute 4.3 1.4 - 7.0 x10E3/uL    Lymphocytes Absolute 1.5 0.7 - 3.1 x10E3/uL   Monocytes Absolute 0.6 0.1 - 0.9 x10E3/uL   EOS (ABSOLUTE) 0.2 0.0 - 0.4 x10E3/uL   Basophils Absolute 0.0 0.0 - 0.2 x10E3/uL   Immature Granulocytes 0 Not Estab. %   Immature Grans (Abs) 0.0 0.0 - 0.1 x10E3/uL  TSH  Status: None   Collection Time: 08/04/20 11:34 AM  Result Value Ref Range   TSH 1.820 0.450 - 4.500 uIU/mL  HgB A1c     Status: Abnormal   Collection Time: 08/04/20 11:34 AM  Result Value Ref Range   Hgb A1c MFr Bld 5.7 (H) 4.8 - 5.6 %    Comment:          Prediabetes: 5.7 - 6.4          Diabetes: >6.4          Glycemic control for adults with diabetes: <7.0    Est. average glucose Bld gHb Est-mCnc 117 mg/dL    Radiology No results found.  Assessment/Plan  Hypercholesterolemia lipid control important in reducing the progression of atherosclerotic disease. Continue statin therapy   Varicose veins of leg with pain, right The patient is undergone successful foam sclerotherapy of a large varicosity on the right lower extremity.  Anatomy was not amenable to any laser ablation.  She does have residual varicosities that would benefit from sclerotherapy on the right leg.  These varicosities cause her pain and discomfort on a daily basis despite appropriate use of compression stockings which she was actually wearing today.  These can be treated with sclerotherapy and the risks and benefits were discussed.  All questions were answered regarding the phone treatment from several weeks ago.    Leotis Pain, MD  10/06/2020 1:35 PM    This note was created with Dragon medical transcription system.  Any errors from dictation are purely unintentional

## 2020-10-11 ENCOUNTER — Other Ambulatory Visit: Payer: Self-pay | Admitting: Physician Assistant

## 2020-10-11 DIAGNOSIS — E7800 Pure hypercholesterolemia, unspecified: Secondary | ICD-10-CM

## 2020-11-15 ENCOUNTER — Encounter (INDEPENDENT_AMBULATORY_CARE_PROVIDER_SITE_OTHER): Payer: Self-pay

## 2020-12-15 ENCOUNTER — Other Ambulatory Visit: Payer: Self-pay

## 2020-12-15 ENCOUNTER — Encounter: Payer: Self-pay | Admitting: Family Medicine

## 2020-12-15 ENCOUNTER — Ambulatory Visit (INDEPENDENT_AMBULATORY_CARE_PROVIDER_SITE_OTHER): Payer: Medicare Other | Admitting: Family Medicine

## 2020-12-15 VITALS — BP 121/77 | HR 66 | Temp 98.6°F | Resp 16 | Ht 66.0 in | Wt 144.0 lb

## 2020-12-15 DIAGNOSIS — H60501 Unspecified acute noninfective otitis externa, right ear: Secondary | ICD-10-CM | POA: Diagnosis not present

## 2020-12-15 MED ORDER — OFLOXACIN 0.3 % OT SOLN: 5.0000 [drp] | Freq: Every day | OTIC | 0 refills | Status: AC

## 2020-12-15 NOTE — Progress Notes (Signed)
Established patient visit   Patient: Diana Blanchard   DOB: 1945/09/12   75 y.o. Female  MRN: [id number] Visit Date: 12/15/2020  Today's healthcare provider: Lavon Paganini, MD   Chief Complaint  Patient presents with   Ear Pain   Subjective    Otalgia  There is pain in the right ear. This is a new problem. The current episode started more than 1 month ago. The problem has been unchanged. There has been no fever. Pertinent negatives include no abdominal pain, coughing, diarrhea, headaches, rash, rhinorrhea, sore throat or vomiting. She has tried nothing for the symptoms. The treatment provided mild relief.    Chronic Cough The patient states that for the past months she has been experiencing a chronic cough that is waxing and waning.  - suspect allergic rhinitis     Medications: Outpatient Medications Prior to Visit  Medication Sig   buPROPion (WELLBUTRIN XL) 150 MG 24 hr tablet Take 1 tablet (150 mg total) by mouth daily.   escitalopram (LEXAPRO) 20 MG tablet Take 1 tablet (20 mg total) by mouth daily.   Multiple Vitamins-Minerals (WOMENS 50+ MULTI VITAMIN/MIN) TABS Take by mouth daily.   simvastatin (ZOCOR) 20 MG tablet TAKE 1 TABLET(20 MG) BY MOUTH DAILY   valACYclovir (VALTREX) 500 MG tablet TAKE 1 TABLET(500 MG) BY MOUTH TWICE DAILY AS NEEDED   ALPRAZolam (XANAX) 0.5 MG tablet SMARTSIG:1 Tablet(s) By Mouth   No facility-administered medications prior to visit.    Review of Systems  Constitutional:  Negative for chills, fatigue and fever.  HENT:  Positive for ear pain. Negative for congestion, rhinorrhea, sinus pain and sore throat.   Respiratory:  Negative for cough, shortness of breath and wheezing.   Cardiovascular:  Negative for chest pain and leg swelling.  Gastrointestinal:  Negative for abdominal pain, blood in stool, diarrhea, nausea and vomiting.  Genitourinary:  Negative for dysuria, flank pain, frequency and urgency.  Skin:  Negative for rash.   Neurological:  Negative for dizziness and headaches.      Objective    BP 121/77   Pulse 66   Temp 98.6 F (37 C)   Resp 16   Ht 5\' 6"  (1.676 m)   Wt 144 lb (65.3 kg)   BMI 23.24 kg/m     Physical Exam Vitals reviewed.  Constitutional:      General: She is not in acute distress.    Appearance: Normal appearance. She is well-developed. She is not diaphoretic.  HENT:     Head: Normocephalic and atraumatic.     Right Ear: Tympanic membrane is erythematous.  Eyes:     General: No scleral icterus.    Conjunctiva/sclera: Conjunctivae normal.  Neck:     Thyroid: No thyromegaly.  Cardiovascular:     Rate and Rhythm: Normal rate and regular rhythm.     Pulses: Normal pulses.     Heart sounds: Normal heart sounds. No murmur heard. Pulmonary:     Effort: Pulmonary effort is normal. No respiratory distress.     Breath sounds: Normal breath sounds. No wheezing, rhonchi or rales.  Musculoskeletal:     Cervical back: Neck supple.     Right lower leg: No edema.     Left lower leg: No edema.  Lymphadenopathy:     Cervical: No cervical adenopathy.  Skin:    General: Skin is warm and dry.     Findings: No rash.  Neurological:     Mental Status: She is alert and  oriented to person, place, and time. Mental status is at baseline.  Psychiatric:        Mood and Affect: Mood normal.        Behavior: Behavior normal.      No results found for any visits on 12/15/20.  Assessment & Plan     Problem List Items Addressed This Visit   None Visit Diagnoses     Acute otitis externa of right ear, unspecified type    -  Primary     - new problem - no evidence of AOM - treat with Ofloxacin drops x7d - avoid glucocorticoid due to allergy - return precautions discussed   Return if symptoms worsen or fail to improve.      Frederic Jericho Moorehead,acting as a Education administrator for Lavon Paganini, MD.,have documented all relevant documentation on the behalf of Lavon Paganini, MD,as directed  by  Lavon Paganini, MD while in the presence of Lavon Paganini, MD.  I, Lavon Paganini, MD, have reviewed all documentation for this visit. The documentation on 12/15/20 for the exam, diagnosis, procedures, and orders are all accurate and complete.   Coyt Govoni, Dionne Bucy, MD, MPH [facility]

## 2020-12-15 NOTE — Patient Instructions (Signed)
Otitis Externa  Otitis externa is an infection of the outer ear canal. The outer ear canal is the area between the outside of the ear and the eardrum. Otitis externa issometimes called swimmer's ear. What are the causes? Common causes of this condition include: Swimming in dirty water. Moisture in the ear. An injury to the inside of the ear. An object stuck in the ear. A cut or scrape on the outside of the ear. What increases the risk? You are more likely to develop this condition if you go swimming often. What are the signs or symptoms? The first symptom of this condition is often itching in the ear. Later symptoms of the condition include: Swelling of the ear. Redness in the ear. Ear pain. The pain may get worse when you pull on your ear. Pus coming from the ear. How is this diagnosed? This condition may be diagnosed by examining the ear and testing fluid from theear for bacteria and funguses. How is this treated? This condition may be treated with: Antibiotic ear drops. These are often given for 10-14 days. Medicines to reduce itching and swelling. Follow these instructions at home: If you were prescribed antibiotic ear drops, use them as told by your health care provider. Do not stop using the antibiotic even if your condition improves. Take over-the-counter and prescription medicines only as told by your health care provider. Avoid getting water in your ears as told by your health care provider. This may include avoiding swimming or water sports for a few days. Keep all follow-up visits as told by your health care provider. This is important. How is this prevented? Keep your ears dry. Use the corner of a towel to dry your ears after you swim or bathe. Avoid scratching or putting things in your ear. Doing these things can damage the ear canal or remove the protective wax that lines it, which makes it easier for bacteria and funguses to grow. Avoid swimming in lakes, polluted  water, or pools that may not have enough chlorine. Contact a health care provider if: You have a fever. Your ear is still red, swollen, painful, or draining pus after 3 days. Your redness, swelling, or pain gets worse. You have a severe headache. You have redness, swelling, pain, or tenderness in the area behind your ear. Summary Otitis externa is an infection of the outer ear canal. Common causes include swimming in dirty water, moisture in the ear, or a cut or scrape in the ear. Symptoms include pain, redness, and swelling of the ear. If you were prescribed antibiotic ear drops, use them as told by your health care provider. Do not stop using the antibiotic even if your condition improves. This information is not intended to replace advice given to you by your health care provider. Make sure you discuss any questions you have with your healthcare provider. Document Revised: 11/06/2017 Document Reviewed: 11/07/2017 Elsevier Patient Education  [place].

## 2021-01-07 ENCOUNTER — Other Ambulatory Visit: Payer: Self-pay | Admitting: Family Medicine

## 2021-01-07 DIAGNOSIS — E7800 Pure hypercholesterolemia, unspecified: Secondary | ICD-10-CM

## 2021-01-07 NOTE — Telephone Encounter (Signed)
Requested Prescriptions  Pending Prescriptions Disp Refills  . simvastatin (ZOCOR) 20 MG tablet [Pharmacy Med Name: SIMVASTATIN '20MG'$  TABLETS] 90 tablet 0    Sig: TAKE 1 TABLET(20 MG) BY MOUTH DAILY     Cardiovascular:  Antilipid - Statins Failed - 01/07/2021  7:21 AM      Failed - Total Cholesterol in normal range and within 360 days    Cholesterol, Total  Date Value Ref Range Status  08/04/2020 223 (H) 100 - 199 mg/dL Final         Failed - LDL in normal range and within 360 days    LDL Chol Calc (NIH)  Date Value Ref Range Status  08/04/2020 133 (H) 0 - 99 mg/dL Final         Passed - HDL in normal range and within 360 days    HDL  Date Value Ref Range Status  08/04/2020 77 >39 mg/dL Final         Passed - Triglycerides in normal range and within 360 days    Triglycerides  Date Value Ref Range Status  08/04/2020 77 0 - 149 mg/dL Final         Passed - Patient is not pregnant      Passed - Valid encounter within last 12 months    Recent Outpatient Visits          3 weeks ago Acute otitis externa of right ear, unspecified type   [place], Dionne Bucy, MD   5 months ago Annual physical exam   [place], [place]   8 months ago Varicose veins of both lower extremities with pain   [place], Vickki Muff, PA-C   10 months ago Moderate episode of recurrent major depressive disorder [facility])   [place], [place], [place]   12 months ago Moderate episode of recurrent major depressive disorder [facility])   [facility] [place], [place], [place]

## 2021-01-11 ENCOUNTER — Other Ambulatory Visit: Payer: Self-pay

## 2021-01-11 ENCOUNTER — Telehealth: Payer: Self-pay

## 2021-01-11 DIAGNOSIS — F419 Anxiety disorder, unspecified: Secondary | ICD-10-CM

## 2021-01-11 MED ORDER — ESCITALOPRAM OXALATE 20 MG PO TABS: 20.0000 mg | ORAL_TABLET | Freq: Every day | ORAL | 0 refills | Status: DC

## 2021-01-11 NOTE — Telephone Encounter (Signed)
[facility] faxed refill request for the following medications:   escitalopram (LEXAPRO) 20 MG tablet   Please advise.  

## 2021-01-15 ENCOUNTER — Encounter (INDEPENDENT_AMBULATORY_CARE_PROVIDER_SITE_OTHER): Payer: Self-pay

## 2021-01-26 ENCOUNTER — Ambulatory Visit (INDEPENDENT_AMBULATORY_CARE_PROVIDER_SITE_OTHER): Payer: Medicare Other | Admitting: Vascular Surgery

## 2021-01-31 ENCOUNTER — Ambulatory Visit (INDEPENDENT_AMBULATORY_CARE_PROVIDER_SITE_OTHER): Payer: Medicare Other | Admitting: Vascular Surgery

## 2021-02-07 ENCOUNTER — Other Ambulatory Visit: Payer: Self-pay

## 2021-02-07 ENCOUNTER — Ambulatory Visit (INDEPENDENT_AMBULATORY_CARE_PROVIDER_SITE_OTHER): Payer: Medicare Other | Admitting: Vascular Surgery

## 2021-02-07 ENCOUNTER — Encounter (INDEPENDENT_AMBULATORY_CARE_PROVIDER_SITE_OTHER): Payer: Self-pay | Admitting: Vascular Surgery

## 2021-02-07 VITALS — BP 116/74 | HR 65 | Resp 16 | Wt 144.4 lb

## 2021-02-07 DIAGNOSIS — I83811 Varicose veins of right lower extremities with pain: Secondary | ICD-10-CM | POA: Diagnosis not present

## 2021-02-07 NOTE — Progress Notes (Signed)
Varicose veins of right  lower extremity with inflammation (454.1  I83.10) Current Plans   Indication: Patient presents with symptomatic varicose veins of the right  lower extremity.   Procedure: Sclerotherapy using hypertonic saline mixed with 1% Lidocaine was performed on the right lower extremity. Compression wraps were placed. The patient tolerated the procedure well. 

## 2021-02-23 ENCOUNTER — Ambulatory Visit (INDEPENDENT_AMBULATORY_CARE_PROVIDER_SITE_OTHER): Payer: Medicare Other | Admitting: Vascular Surgery

## 2021-02-27 ENCOUNTER — Ambulatory Visit (INDEPENDENT_AMBULATORY_CARE_PROVIDER_SITE_OTHER): Payer: Medicare Other | Admitting: Vascular Surgery

## 2021-02-27 ENCOUNTER — Other Ambulatory Visit: Payer: Self-pay

## 2021-02-27 ENCOUNTER — Encounter (INDEPENDENT_AMBULATORY_CARE_PROVIDER_SITE_OTHER): Payer: Self-pay | Admitting: Vascular Surgery

## 2021-02-27 VITALS — BP 133/86 | HR 57 | Resp 16 | Wt 146.0 lb

## 2021-02-27 DIAGNOSIS — I83811 Varicose veins of right lower extremities with pain: Secondary | ICD-10-CM

## 2021-02-27 NOTE — Progress Notes (Signed)
Diana Blanchard is a 75 y.o.female who presents with painful varicose veins of the right leg  Past Medical History:  Diagnosis Date   Anxiety    Arthritis    fingers   Billowing mitral valve    Carpal tunnel syndrome    Depression    Dysplastic nevus 05/20/2006   R lat buttock - moderate   Dysplastic nevus 12/31/2006   R back braline - moderate, excised 04/07/2007   Dysplastic nevus 10/06/2008   R back paraspinal med to inf scapula - mild to mod   GERD (gastroesophageal reflux disease)    Headache    Hypercholesterolemia    Mitral valve prolapse    Varicose vein of leg     Past Surgical History:  Procedure Laterality Date   bowel obstruction  1990   BREAST CYST ASPIRATION Right 1980's   COLONOSCOPY WITH PROPOFOL N/A 06/29/2015   Procedure: COLONOSCOPY WITH PROPOFOL;  Surgeon: Manya Silvas, MD;  Location: [facility];  Service: Endoscopy;  Laterality: N/A;   COLONOSCOPY WITH PROPOFOL N/A 07/27/2020   Procedure: COLONOSCOPY WITH PROPOFOL;  Surgeon: Lucilla Lame, MD;  Location: [place];  Service: Endoscopy;  Laterality: N/A;  priority 4   cyst removed  1981   on the ovary   LAPAROSCOPY  [id number]   complications from IUD   POLYPECTOMY N/A 07/27/2020   Procedure: POLYPECTOMY;  Surgeon: Lucilla Lame, MD;  Location: [place];  Service: Endoscopy;  Laterality: N/A;   ROTATOR CUFF REPAIR Right 06/2011   TONSILLECTOMY AND ADENOIDECTOMY  1953    Current Outpatient Medications  Medication Sig Dispense Refill   buPROPion (WELLBUTRIN XL) 150 MG 24 hr tablet Take 1 tablet (150 mg total) by mouth daily. 90 tablet 1   escitalopram (LEXAPRO) 20 MG tablet Take 1 tablet (20 mg total) by mouth daily. 90 tablet 0   Multiple Vitamins-Minerals (WOMENS 50+ MULTI VITAMIN/MIN) TABS Take by mouth daily.     simvastatin (ZOCOR) 20 MG tablet TAKE 1 TABLET(20 MG) BY MOUTH DAILY 90 tablet 0   valACYclovir (VALTREX) 500 MG tablet TAKE 1 TABLET(500 MG) BY MOUTH TWICE DAILY AS NEEDED 30  tablet 5   ALPRAZolam (XANAX) 0.5 MG tablet SMARTSIG:1 Tablet(s) By Mouth     No current facility-administered medications for this visit.    Allergies  Allergen Reactions   Latex Itching   Neosporin [Neomycin-Bacitracin Zn-Polymyx]    Prednisone Swelling    And redness   Sulfa Antibiotics Other (See Comments)   Benzalkonium Chloride Itching and Rash    Indication: Patient presents with symptomatic varicose veins of the right lower extremity.  Procedure: Foam sclerotherapy was performed on the right lower extremity. Using ultrasound guidance, 5 mL of foam Sotradecol was used to inject the varicosities of the right lower extremity. Compression wraps were placed. The patient tolerated the procedure well.

## 2021-03-04 ENCOUNTER — Other Ambulatory Visit: Payer: Self-pay | Admitting: Physician Assistant

## 2021-03-04 DIAGNOSIS — F331 Major depressive disorder, recurrent, moderate: Secondary | ICD-10-CM

## 2021-03-09 ENCOUNTER — Encounter: Payer: Self-pay | Admitting: Family Medicine

## 2021-03-09 MED ORDER — NIRMATRELVIR/RITONAVIR (PAXLOVID) TABLET (RENAL DOSING): 2.0000 | ORAL_TABLET | Freq: Two times a day (BID) | ORAL | 0 refills | Status: AC

## 2021-03-23 ENCOUNTER — Ambulatory Visit (INDEPENDENT_AMBULATORY_CARE_PROVIDER_SITE_OTHER): Payer: Medicare Other | Admitting: Vascular Surgery

## 2021-04-05 ENCOUNTER — Other Ambulatory Visit: Payer: Self-pay | Admitting: Family Medicine

## 2021-04-05 DIAGNOSIS — E7800 Pure hypercholesterolemia, unspecified: Secondary | ICD-10-CM

## 2021-04-05 NOTE — Telephone Encounter (Signed)
Requested Prescriptions  Pending Prescriptions Disp Refills  . simvastatin (ZOCOR) 20 MG tablet [Pharmacy Med Name: SIMVASTATIN 20MG  TABLETS] 90 tablet 1    Sig: TAKE 1 TABLET(20 MG) BY MOUTH DAILY     Cardiovascular:  Antilipid - Statins Failed - 04/05/2021  3:14 AM      Failed - Total Cholesterol in normal range and within 360 days    Cholesterol, Total  Date Value Ref Range Status  08/04/2020 223 (H) 100 - 199 mg/dL Final         Failed - LDL in normal range and within 360 days    LDL Chol Calc (NIH)  Date Value Ref Range Status  08/04/2020 133 (H) 0 - 99 mg/dL Final         Passed - HDL in normal range and within 360 days    HDL  Date Value Ref Range Status  08/04/2020 77 >39 mg/dL Final         Passed - Triglycerides in normal range and within 360 days    Triglycerides  Date Value Ref Range Status  08/04/2020 77 0 - 149 mg/dL Final         Passed - Patient is not pregnant      Passed - Valid encounter within last 12 months    Recent Outpatient Visits          3 months ago Acute otitis externa of right ear, unspecified type   [facility] [place], Dionne Bucy, MD   8 months ago Annual physical exam   [place], [place]   11 months ago Varicose veins of both lower extremities with pain   [place], PA-C   1 year ago Moderate episode of recurrent major depressive disorder [facility])   [place], [place]   1 year ago Moderate episode of recurrent major depressive disorder [facility])   [facility] [place], [place], [place]

## 2021-04-09 ENCOUNTER — Other Ambulatory Visit: Payer: Self-pay | Admitting: Family Medicine

## 2021-04-09 DIAGNOSIS — F419 Anxiety disorder, unspecified: Secondary | ICD-10-CM

## 2021-04-09 NOTE — Telephone Encounter (Signed)
Requested Prescriptions  Pending Prescriptions Disp Refills  . escitalopram (LEXAPRO) 20 MG tablet [Pharmacy Med Name: ESCITALOPRAM 20MG  TABLETS] 90 tablet 0    Sig: TAKE 1 TABLET(20 MG) BY MOUTH DAILY     Psychiatry:  Antidepressants - SSRI Passed - 04/09/2021  3:14 AM      Passed - Completed PHQ-2 or PHQ-9 in the last 360 days      Passed - Valid encounter within last 6 months    Recent Outpatient Visits          3 months ago Acute otitis externa of right ear, unspecified type   [facility] [place], Dionne Bucy, MD   8 months ago Annual physical exam   [place], [place]   11 months ago Varicose veins of both lower extremities with pain   [place], PA-C   1 year ago Moderate episode of recurrent major depressive disorder [facility])   [place], [place]   1 year ago Moderate episode of recurrent major depressive disorder [facility])   [facility] [place], [place], [place]

## 2021-04-25 ENCOUNTER — Ambulatory Visit: Payer: Medicare Other

## 2021-06-09 ENCOUNTER — Other Ambulatory Visit: Payer: Self-pay | Admitting: Family Medicine

## 2021-06-09 DIAGNOSIS — F331 Major depressive disorder, recurrent, moderate: Secondary | ICD-10-CM

## 2021-06-09 NOTE — Telephone Encounter (Signed)
Requested Prescriptions  Pending Prescriptions Disp Refills   buPROPion (WELLBUTRIN XL) 150 MG 24 hr tablet [Pharmacy Med Name: BUPROPION XL 150MG  TABLETS (24 H)] 30 tablet 0    Sig: TAKE 1 TABLET(150 MG) BY MOUTH DAILY     Psychiatry: Antidepressants - bupropion Passed - 06/09/2021  3:12 AM      Passed - Completed PHQ-2 or PHQ-9 in the last 360 days      Passed - Last BP in normal range    BP Readings from Last 1 Encounters:  02/27/21 133/86         Passed - Valid encounter within last 6 months    Recent Outpatient Visits          5 months ago Acute otitis externa of right ear, unspecified type   [facility] [place], Dionne Bucy, MD   10 months ago Annual physical exam   [place], [place]   1 year ago Varicose veins of both lower extremities with pain   [place], PA-C   1 year ago Moderate episode of recurrent major depressive disorder [facility])   [place], [place]   1 year ago Moderate episode of recurrent major depressive disorder [facility])   [facility] [place], [place], [place]

## 2021-06-21 ENCOUNTER — Other Ambulatory Visit: Payer: Self-pay

## 2021-06-21 ENCOUNTER — Encounter (INDEPENDENT_AMBULATORY_CARE_PROVIDER_SITE_OTHER): Payer: Self-pay | Admitting: Nurse Practitioner

## 2021-06-21 ENCOUNTER — Ambulatory Visit (INDEPENDENT_AMBULATORY_CARE_PROVIDER_SITE_OTHER): Payer: Medicare Other | Admitting: Nurse Practitioner

## 2021-06-21 VITALS — BP 111/71 | HR 71 | Ht 66.0 in | Wt 149.0 lb

## 2021-06-21 DIAGNOSIS — I83811 Varicose veins of right lower extremities with pain: Secondary | ICD-10-CM | POA: Diagnosis not present

## 2021-06-24 ENCOUNTER — Encounter (INDEPENDENT_AMBULATORY_CARE_PROVIDER_SITE_OTHER): Payer: Self-pay | Admitting: Nurse Practitioner

## 2021-07-07 ENCOUNTER — Other Ambulatory Visit: Payer: Self-pay | Admitting: Family Medicine

## 2021-07-07 DIAGNOSIS — F419 Anxiety disorder, unspecified: Secondary | ICD-10-CM

## 2021-07-07 NOTE — Telephone Encounter (Signed)
Requested medication (s) are due for refill today: yes  Requested medication (s) are on the active medication list: yes  Last refill:  04/09/21 for 3 month supply  Future visit scheduled: no  Notes to clinic:  Was last seen for this med/dx 07/28/2020, no upcoming visit scheduled, please assess.  Requested Prescriptions  Pending Prescriptions Disp Refills   escitalopram (LEXAPRO) 20 MG tablet [Pharmacy Med Name: ESCITALOPRAM 20MG  TABLETS] 90 tablet 0    Sig: TAKE 1 TABLET(20 MG) BY MOUTH DAILY     Psychiatry:  Antidepressants - SSRI Failed - 07/07/2021  3:12 AM      Failed - Valid encounter within last 6 months    Recent Outpatient Visits           6 months ago Acute otitis externa of right ear, unspecified type   [facility], Dionne Bucy, MD   11 months ago Annual physical exam   [facility] [place], [place]   1 year ago Varicose veins of both lower extremities with pain   [place], PA-C   1 year ago Moderate episode of recurrent major depressive disorder [facility])   [place], [place], [place]   1 year ago Moderate episode of recurrent major depressive disorder [facility])   [place], [place]              Passed - Completed PHQ-2 or PHQ-9 in the last 360 days

## 2021-07-11 ENCOUNTER — Other Ambulatory Visit: Payer: Self-pay

## 2021-07-11 ENCOUNTER — Ambulatory Visit (INDEPENDENT_AMBULATORY_CARE_PROVIDER_SITE_OTHER): Payer: Medicare Other | Admitting: Nurse Practitioner

## 2021-07-11 VITALS — BP 138/87 | HR 62 | Ht 66.0 in | Wt 149.0 lb

## 2021-07-11 DIAGNOSIS — I83811 Varicose veins of right lower extremities with pain: Secondary | ICD-10-CM

## 2021-07-11 DIAGNOSIS — I83813 Varicose veins of bilateral lower extremities with pain: Secondary | ICD-10-CM

## 2021-07-16 ENCOUNTER — Other Ambulatory Visit: Payer: Self-pay

## 2021-07-16 ENCOUNTER — Encounter (INDEPENDENT_AMBULATORY_CARE_PROVIDER_SITE_OTHER): Payer: Self-pay | Admitting: Nurse Practitioner

## 2021-07-16 ENCOUNTER — Other Ambulatory Visit: Payer: Self-pay | Admitting: Physician Assistant

## 2021-07-16 ENCOUNTER — Ambulatory Visit (INDEPENDENT_AMBULATORY_CARE_PROVIDER_SITE_OTHER): Payer: Medicare Other

## 2021-07-16 VITALS — BP 126/80 | HR 65 | Temp 98.2°F | Ht 66.0 in | Wt 151.4 lb

## 2021-07-16 DIAGNOSIS — F331 Major depressive disorder, recurrent, moderate: Secondary | ICD-10-CM

## 2021-07-16 DIAGNOSIS — Z0000 Encounter for general adult medical examination without abnormal findings: Secondary | ICD-10-CM | POA: Diagnosis not present

## 2021-07-16 MED ORDER — BUPROPION HCL ER (XL) 150 MG PO TB24: ORAL_TABLET | ORAL | 1 refills | Status: DC

## 2021-07-16 NOTE — Progress Notes (Signed)
Subjective:   Lonetta Blassingame is a 76 y.o. female who presents for Medicare Annual (Subsequent) preventive examination.  Review of Systems           Objective:    Today's Vitals   07/16/21 1031  BP: 126/80  Pulse: 65  Temp: 98.2 F (36.8 C)  TempSrc: Oral  SpO2: 100%  Weight: 151 lb 6.4 oz (68.7 kg)  Height: 5\' 6"  (1.676 m)   Body mass index is 24.44 kg/m.  Advanced Directives 07/27/2020 07/10/2020 07/06/2019 06/30/2018 08/06/2016 06/07/2016 06/02/2015  Does Patient Have a Medical Advance Directive? Yes Yes Yes Yes Yes Yes Yes  Type of Paramedic of [place];Living will [place];Living will [place];Living will [place];Living will [place];Living will [place];Living will [place];Living will  Does patient want to make changes to medical advance directive? No - Guardian declined - - - - - -  Copy of [place] in Chart? No - copy requested No - copy requested No - copy requested No - copy requested - - -    Current Medications (verified) Outpatient Encounter Medications as of 07/16/2021  Medication Sig   ALPRAZolam (XANAX) 0.5 MG tablet SMARTSIG:1 Tablet(s) By Mouth   buPROPion (WELLBUTRIN XL) 150 MG 24 hr tablet TAKE 1 TABLET(150 MG) BY MOUTH DAILY   escitalopram (LEXAPRO) 20 MG tablet TAKE 1 TABLET(20 MG) BY MOUTH DAILY   Multiple Vitamins-Minerals (WOMENS 50+ MULTI VITAMIN/MIN) TABS Take by mouth daily.   simvastatin (ZOCOR) 20 MG tablet TAKE 1 TABLET(20 MG) BY MOUTH DAILY   valACYclovir (VALTREX) 500 MG tablet TAKE 1 TABLET(500 MG) BY MOUTH TWICE DAILY AS NEEDED   No facility-administered encounter medications on file as of 07/16/2021.    Allergies (verified) Latex, Neosporin [neomycin-bacitracin zn-polymyx], Prednisone, Sulfa antibiotics, and Benzalkonium chloride   History: Past Medical History:  Diagnosis  Date   Anxiety    Arthritis    fingers   Billowing mitral valve    Carpal tunnel syndrome    Depression    Dysplastic nevus 05/20/2006   R lat buttock - moderate   Dysplastic nevus 12/31/2006   R back braline - moderate, excised 04/07/2007   Dysplastic nevus 10/06/2008   R back paraspinal med to inf scapula - mild to mod   GERD (gastroesophageal reflux disease)    Headache    Hypercholesterolemia    Mitral valve prolapse    Varicose vein of leg    Past Surgical History:  Procedure Laterality Date   bowel obstruction  1990   BREAST CYST ASPIRATION Right 1980's   COLONOSCOPY WITH PROPOFOL N/A 06/29/2015   Procedure: COLONOSCOPY WITH PROPOFOL;  Surgeon: Manya Silvas, MD;  Location[facility] ENDOSCOPY;  Service: Endoscopy;  Laterality: N/A;   COLONOSCOPY WITH PROPOFOL N/A 07/27/2020   Procedure: COLONOSCOPY WITH PROPOFOL;  Surgeon: Lucilla Lame, MD;  Location: [place];  Service: Endoscopy;  Laterality: N/A;  priority 4   cyst removed  1981   on the ovary   LAPAROSCOPY  7322   complications from IUD   POLYPECTOMY N/A 07/27/2020   Procedure: POLYPECTOMY;  Surgeon: Lucilla Lame, MD;  Location: [place];  Service: Endoscopy;  Laterality: N/A;   ROTATOR CUFF REPAIR Right 06/2011   TONSILLECTOMY AND ADENOIDECTOMY  1953   Family History  Problem Relation Age of Onset   Arthritis Mother    Hyperlipidemia Mother    Hypertension Mother  Alzheimer's disease Mother    Diabetes Father    CVA Father    HIV Brother    Breast cancer Neg Hx    Social History   Socioeconomic History   Marital status: Married    Spouse name: Merry Proud   Number of children: 1   Years of education: business college   Highest education level: Associate degree: occupational, Hotel manager, or vocational program  Occupational History   Occupation: Retired  Tobacco Use   Smoking status: Former    Types: Cigarettes    Quit date: 06/16/1969    Years since quitting: 52.1   Smokeless tobacco:  Never  Vaping Use   Vaping Use: Never used  Substance and Sexual Activity   Alcohol use: Yes    Alcohol/week: 4.0 - 6.0 standard drinks    Types: 4 - 6 Glasses of wine per week   Drug use: No   Sexual activity: Not on file  Other Topics Concern   Not on file  Social History Narrative   Not on file   Social Determinants of Health   Financial Resource Strain: Not on file  Food Insecurity: Not on file  Transportation Needs: Not on file  Physical Activity: Not on file  Stress: Not on file  Social Connections: Not on file    Tobacco Counseling Counseling given: Not Answered   Clinical Intake:  Pre-visit preparation completed: Yes  Pain : No/denies pain     Nutritional Status: BMI of 19-24  Normal Nutritional Risks: None Diabetes: No  How often do you need to have someone help you when you read instructions, pamphlets, or other written materials from your doctor or pharmacy?: 1 - Never  Diabetic?no  Interpreter Needed?: No  Information entered by :: Kirke Shaggy, LPN   Activities of Daily Living In your present state of health, do you have any difficulty performing the following activities: 07/15/2021 07/27/2020  Hearing? N N  Vision? N N  Difficulty concentrating or making decisions? N N  Walking or climbing stairs? N N  Dressing or bathing? N N  Doing errands, shopping? N -  Preparing Food and eating ? N -  Using the Toilet? N -  In the past six months, have you accidently leaked urine? N -  Do you have problems with loss of bowel control? N -  Managing your Medications? N -  Housekeeping or managing your Housekeeping? N -  Some recent data might be hidden    Patient Care Team: Mar Daring, PA-C as PCP - General (Family Medicine) Ralene Bathe, MD (Dermatology) Arelia Sneddon, [place] (Optometry) Lucky Cowboy Erskine Squibb, MD as Referring Physician (Vascular Surgery) Rosalene Billings., MD (Dentistry)  Indicate any recent Medical Services you may have  received from other than [facility] providers in the past year (date may be approximate).     Assessment:   This is a routine wellness examination for Azalie.  Hearing/Vision screen No results found.  Dietary issues and exercise activities discussed:     Goals Addressed   None    Depression Screen PHQ 2/9 Scores 12/15/2020 07/28/2020 07/10/2020 01/13/2020 07/06/2019 07/06/2019 06/30/2018  PHQ - 2 Score 0 1 1 3 1 1 2   PHQ- 9 Score 1 1 - 5 - - 2    Fall Risk Fall Risk  07/15/2021 12/15/2020 07/10/2020 07/06/2019 06/30/2018  Falls in the past year? 0 0 0 0 0  Number falls in past yr: 0 0 0 0 -  Injury with Fall? 0 0  0 0 -  Risk for fall due to : - No Fall Risks - - -  Follow up - Falls evaluation completed - - -    FALL RISK PREVENTION PERTAINING TO THE HOME:  Any stairs in or around the home? Yes  If so, are there any without handrails? No  Home free of loose throw rugs in walkways, pet beds, electrical cords, etc? Yes  Adequate lighting in your home to reduce risk of falls? Yes   ASSISTIVE DEVICES UTILIZED TO PREVENT FALLS:  Life alert? No  Use of a cane, walker or w/c? No  Grab bars in the bathroom? Yes  Shower chair or bench in shower? No  Elevated toilet seat or a handicapped toilet? Yes   TIMED UP AND GO:  Was the test performed? Yes .  Length of time to ambulate 10 feet: 4 sec.   Gait steady and fast without use of assistive device  Cognitive Function:     6CIT Screen 07/28/2020 07/06/2019 06/30/2018  What Year? 0 points 0 points 0 points  What month? 0 points 0 points 0 points  What time? 0 points 0 points 0 points  Count back from 20 0 points 0 points 0 points  Months in reverse 0 points 0 points 0 points  Repeat phrase 0 points 0 points 0 points  Total Score 0 0 0    Immunizations Immunization History  Administered Date(s) Administered   Fluad Quad(high Dose 65+) 03/17/2019, 03/23/2020   Influenza Split 04/03/2009, 03/01/2010, 03/08/2011, 04/06/2012   Influenza,  High Dose Seasonal PF 04/19/2014, 04/05/2015, 04/11/2016, 03/26/2017, 04/08/2018   Influenza,inj,Quad PF,6+ Mos 04/07/2013   PFIZER(Purple Top)SARS-COV-2 Vaccination 06/21/2019, 07/12/2019, 04/20/2020   Pneumococcal Conjugate-13 06/03/2014   Pneumococcal Polysaccharide-23 06/17/1998, 04/07/2013   Tdap 04/09/2005, 06/02/2015   Zoster Recombinat (Shingrix) 12/02/2017, 04/07/2018   Zoster, Live 01/13/2008    TDAP status: Up to date  Flu Vaccine status: Up to date  Pneumococcal vaccine status: Up to date  Covid-19 vaccine status: Completed vaccines  Qualifies for Shingles Vaccine? Yes   Zostavax completed Yes   Shingrix Completed?: Yes  Screening Tests Health Maintenance  Topic Date Due   COVID-19 Vaccine (4 - Booster for Pfizer series) 06/15/2020   INFLUENZA VACCINE  01/15/2021   MAMMOGRAM  07/17/2022   DEXA SCAN  07/22/2022   TETANUS/TDAP  06/01/2025   COLONOSCOPY (Pts 45-28yrs Insurance coverage will need to be confirmed)  07/27/2025   Pneumonia Vaccine 24+ Years old  Completed   Hepatitis C Screening  Completed   Zoster Vaccines- Shingrix  Completed   HPV VACCINES  Aged Out    Health Maintenance  Health Maintenance Due  Topic Date Due   COVID-19 Vaccine (4 - Booster for [place] series) 06/15/2020   INFLUENZA VACCINE  01/15/2021    Colorectal cancer screening: Type of screening: Colonoscopy. Completed 07/27/20. Repeat every 5 years  Mammogram status: Completed 07/17/20. Repeat every year  Bone Density status: Completed 07/22/17. Results reflect: Bone density results: NORMAL. Repeat every 5 years.  Lung Cancer Screening: (Low Dose CT Chest recommended if Age 8-80 years, 30 pack-year currently smoking OR have quit w/in 15years.) does not qualify.   Additional Screening:  Hepatitis C Screening: does qualify; Completed 06/27/17  Vision Screening: Recommended annual ophthalmology exams for early detection of glaucoma and other disorders of the eye. Is the patient up to  date with their annual eye exam?  Yes  Who is the provider or what is the name of the office in  which the patient attends annual eye exams? [facility] If pt is not established with a provider, would they like to be referred to a provider to establish care? No .   Dental Screening: Recommended annual dental exams for proper oral hygiene  Community Resource Referral / Chronic Care Management: CRR required this visit?  No   CCM required this visit?  No      Plan:     I have personally reviewed and noted the following in the patients chart:   Medical and social history Use of alcohol, tobacco or illicit drugs  Current medications and supplements including opioid prescriptions.  Functional ability and status Nutritional status Physical activity Advanced directives List of other physicians Hospitalizations, surgeries, and ER visits in previous 12 months Vitals Screenings to include cognitive, depression, and falls Referrals and appointments  In addition, I have reviewed and discussed with patient certain preventive protocols, quality metrics, and best practice recommendations. A written personalized care plan for preventive services as well as general preventive health recommendations were provided to patient.     Dionisio David, LPN   [id number]   Nurse Notes: none

## 2021-07-16 NOTE — Progress Notes (Signed)
Varicose veins of bilateral  lower extremity with inflammation (454.1  I83.10) Current Plans   Indication: Patient presents with symptomatic varicose veins of the bilateral  lower extremity.   Procedure: Sclerotherapy using hypertonic saline mixed with 1% Lidocaine was performed on the bilateral lower extremity. Compression wraps were placed. The patient tolerated the procedure well. 

## 2021-07-16 NOTE — Patient Instructions (Signed)
[name] , Thank you for taking time to come for your Medicare Wellness Visit. I appreciate your ongoing commitment to your health goals. Please review the following plan we discussed and let me know if I can assist you in the future.   Screening recommendations/referrals: Colonoscopy: 07/27/20 Mammogram: 07/17/20 Bone Density: 07/22/17 Recommended yearly ophthalmology/optometry visit for glaucoma screening and checkup Recommended yearly dental visit for hygiene and checkup  Vaccinations: Influenza vaccine: 04/25/21 Pneumococcal vaccine: 06/03/14 Tdap vaccine: 06/02/15 Shingles vaccine: Zostavax 01/13/08  Shingrix 12/02/17, 04/07/18   Covid-19:06/21/19, 07/11/20, 04/20/20  Advanced directives: no  Conditions/risks identified: none  Next appointment: Follow up in one year for your annual wellness visit    Preventive Care 39 Years and Older, Female Preventive care refers to lifestyle choices and visits with your health care provider that can promote health and wellness. What does preventive care include? A yearly physical exam. This is also called an annual well check. Dental exams once or twice a year. Routine eye exams. Ask your health care provider how often you should have your eyes checked. Personal lifestyle choices, including: Daily care of your teeth and gums. Regular physical activity. Eating a healthy diet. Avoiding tobacco and drug use. Limiting alcohol use. Practicing safe sex. Taking low-dose aspirin every day. Taking vitamin and mineral supplements as recommended by your health care provider. What happens during an annual well check? The services and screenings done by your health care provider during your annual well check will depend on your age, overall health, lifestyle risk factors, and family history of disease. Counseling  Your health care provider may ask you questions about your: Alcohol use. Tobacco use. Drug use. Emotional well-being. Home and relationship  well-being. Sexual activity. Eating habits. History of falls. Memory and ability to understand (cognition). Work and work Statistician. Reproductive health. Screening  You may have the following tests or measurements: Height, weight, and BMI. Blood pressure. Lipid and cholesterol levels. These may be checked every 5 years, or more frequently if you are over 74 years old. Skin check. Lung cancer screening. You may have this screening every year starting at age 83 if you have a 30-pack-year history of smoking and currently smoke or have quit within the past 15 years. Fecal occult blood test (FOBT) of the stool. You may have this test every year starting at age 90. Flexible sigmoidoscopy or colonoscopy. You may have a sigmoidoscopy every 5 years or a colonoscopy every 10 years starting at age 61. Hepatitis C blood test. Hepatitis B blood test. Sexually transmitted disease (STD) testing. Diabetes screening. This is done by checking your blood sugar (glucose) after you have not eaten for a while (fasting). You may have this done every 1-3 years. Bone density scan. This is done to screen for osteoporosis. You may have this done starting at age 16. Mammogram. This may be done every 1-2 years. Talk to your health care provider about how often you should have regular mammograms. Talk with your health care provider about your test results, treatment options, and if necessary, the need for more tests. Vaccines  Your health care provider may recommend certain vaccines, such as: Influenza vaccine. This is recommended every year. Tetanus, diphtheria, and acellular pertussis (Tdap, Td) vaccine. You may need a Td booster every 10 years. Zoster vaccine. You may need this after age 24. Pneumococcal 13-valent conjugate (PCV13) vaccine. One dose is recommended after age 37. Pneumococcal polysaccharide (PPSV23) vaccine. One dose is recommended after age 49. Talk to your health care  provider about which  screenings and vaccines you need and how often you need them. This information is not intended to replace advice given to you by your health care provider. Make sure you discuss any questions you have with your health care provider. Document Released: 06/30/2015 Document Revised: 02/21/2016 Document Reviewed: 04/04/2015 Elsevier Interactive Patient Education  2017 [place] Prevention in the Home Falls can cause injuries. They can happen to people of all ages. There are many things you can do to make your home safe and to help prevent falls. What can I do on the outside of my home? Regularly fix the edges of walkways and driveways and fix any cracks. Remove anything that might make you trip as you walk through a door, such as a raised step or threshold. Trim any bushes or trees on the path to your home. Use bright outdoor lighting. Clear any walking paths of anything that might make someone trip, such as rocks or tools. Regularly check to see if handrails are loose or broken. Make sure that both sides of any steps have handrails. Any raised decks and porches should have guardrails on the edges. Have any leaves, snow, or ice cleared regularly. Use sand or salt on walking paths during winter. Clean up any spills in your garage right away. This includes oil or grease spills. What can I do in the bathroom? Use night lights. Install grab bars by the toilet and in the tub and shower. Do not use towel bars as grab bars. Use non-skid mats or decals in the tub or shower. If you need to sit down in the shower, use a plastic, non-slip stool. Keep the floor dry. Clean up any water that spills on the floor as soon as it happens. Remove soap buildup in the tub or shower regularly. Attach bath mats securely with double-sided non-slip rug tape. Do not have throw rugs and other things on the floor that can make you trip. What can I do in the bedroom? Use night lights. Make sure that you have a  light by your bed that is easy to reach. Do not use any sheets or blankets that are too big for your bed. They should not hang down onto the floor. Have a firm chair that has side arms. You can use this for support while you get dressed. Do not have throw rugs and other things on the floor that can make you trip. What can I do in the kitchen? Clean up any spills right away. Avoid walking on wet floors. Keep items that you use a lot in easy-to-reach places. If you need to reach something above you, use a strong step stool that has a grab bar. Keep electrical cords out of the way. Do not use floor polish or wax that makes floors slippery. If you must use wax, use non-skid floor wax. Do not have throw rugs and other things on the floor that can make you trip. What can I do with my stairs? Do not leave any items on the stairs. Make sure that there are handrails on both sides of the stairs and use them. Fix handrails that are broken or loose. Make sure that handrails are as long as the stairways. Check any carpeting to make sure that it is firmly attached to the stairs. Fix any carpet that is loose or worn. Avoid having throw rugs at the top or bottom of the stairs. If you do have throw rugs, attach them to the  floor with carpet tape. Make sure that you have a light switch at the top of the stairs and the bottom of the stairs. If you do not have them, ask someone to add them for you. What else can I do to help prevent falls? Wear shoes that: Do not have high heels. Have rubber bottoms. Are comfortable and fit you well. Are closed at the toe. Do not wear sandals. If you use a stepladder: Make sure that it is fully opened. Do not climb a closed stepladder. Make sure that both sides of the stepladder are locked into place. Ask someone to hold it for you, if possible. Clearly mark and make sure that you can see: Any grab bars or handrails. First and last steps. Where the edge of each step  is. Use tools that help you move around (mobility aids) if they are needed. These include: Canes. Walkers. Scooters. Crutches. Turn on the lights when you go into a dark area. Replace any light bulbs as soon as they burn out. Set up your furniture so you have a clear path. Avoid moving your furniture around. If any of your floors are uneven, fix them. If there are any pets around you, be aware of where they are. Review your medicines with your doctor. Some medicines can make you feel dizzy. This can increase your chance of falling. Ask your doctor what other things that you can do to help prevent falls. This information is not intended to replace advice given to you by your health care provider. Make sure you discuss any questions you have with your health care provider. Document Released: 03/30/2009 Document Revised: 11/09/2015 Document Reviewed: 07/08/2014 Elsevier Interactive Patient Education  2017 [employer].

## 2021-07-25 ENCOUNTER — Ambulatory Visit (INDEPENDENT_AMBULATORY_CARE_PROVIDER_SITE_OTHER): Payer: Medicare Other | Admitting: Physician Assistant

## 2021-07-25 ENCOUNTER — Other Ambulatory Visit: Payer: Self-pay

## 2021-07-25 ENCOUNTER — Encounter: Payer: Self-pay | Admitting: Physician Assistant

## 2021-07-25 VITALS — BP 123/84 | HR 66 | Ht 66.0 in | Wt 147.9 lb

## 2021-07-25 DIAGNOSIS — R7303 Prediabetes: Secondary | ICD-10-CM | POA: Diagnosis not present

## 2021-07-25 DIAGNOSIS — E7800 Pure hypercholesterolemia, unspecified: Secondary | ICD-10-CM

## 2021-07-25 DIAGNOSIS — Z1231 Encounter for screening mammogram for malignant neoplasm of breast: Secondary | ICD-10-CM

## 2021-07-25 DIAGNOSIS — F331 Major depressive disorder, recurrent, moderate: Secondary | ICD-10-CM | POA: Diagnosis not present

## 2021-07-25 DIAGNOSIS — Z0000 Encounter for general adult medical examination without abnormal findings: Secondary | ICD-10-CM

## 2021-07-25 DIAGNOSIS — M19041 Primary osteoarthritis, right hand: Secondary | ICD-10-CM

## 2021-07-25 DIAGNOSIS — M19042 Primary osteoarthritis, left hand: Secondary | ICD-10-CM

## 2021-07-25 NOTE — Progress Notes (Signed)
I,Sha'taria Tyson,acting as a Education administrator for [employer], PA-C.,have documented all relevant documentation on the behalf of Diana Kirschner, PA-C,as directed by  Diana Kirschner, PA-C while in the presence of Diana Kirschner, PA-C.'  Complete physical exam   Patient: Diana Blanchard   DOB: May 18, 1946   76 y.o. Female  MRN: [id number] Visit Date: 07/25/2021  Today's healthcare provider: [name], PA-C   Cc. cpe  Subjective    [name] is a 76 y.o. female who presents today for a complete physical exam.  She reports consuming a general and low sodium diet. The patient does not participate in regular exercise at present. She generally feels well. She reports sleeping well. She does have additional problems to discuss today.   HPI  She has a history of arthritis in her hands, but has felt over the past few weeks increased pain and stiffness in her fingers. Chronic stiffness and pain left lower back.  Denies numbness, paresthesias.  Past Medical History:  Diagnosis Date   Anxiety    Arthritis    fingers   Billowing mitral valve    Carpal tunnel syndrome    Depression    Dysplastic nevus 05/20/2006   R lat buttock - moderate   Dysplastic nevus 12/31/2006   R back braline - moderate, excised 04/07/2007   Dysplastic nevus 10/06/2008   R back paraspinal med to inf scapula - mild to mod   GERD (gastroesophageal reflux disease)    Headache    Hypercholesterolemia    Mitral valve prolapse    Varicose vein of leg    Past Surgical History:  Procedure Laterality Date   bowel obstruction  1990   BREAST CYST ASPIRATION Right 1980's   COLONOSCOPY WITH PROPOFOL N/A 06/29/2015   Procedure: COLONOSCOPY WITH PROPOFOL;  Surgeon: Manya Silvas, MD;  Location: [facility] END[facility]COPY;  Service: Endoscopy;  Laterality: N/A;   COLONOSCOPY WITH PROPOFOL N/A 07/27/2020   Procedure: COLONOSCOPY WITH PROPOFOL;  Surgeon: Lucilla Lame, MD;  Location: [place];  Service: Endoscopy;  Laterality:  N/A;  priority 4   cyst removed  1981   on the ovary   LAPAROSCOPY  8527   complications from IUD   POLYPECTOMY N/A 07/27/2020   Procedure: POLYPECTOMY;  Surgeon: Lucilla Lame, MD;  Location: [place];  Service: Endoscopy;  Laterality: N/A;   ROTATOR CUFF REPAIR Right 06/2011   TONSILLECTOMY AND ADENOIDECTOMY  1953   Social History   Socioeconomic History   Marital status: Married    Spouse name: Merry Proud   Number of children: 1   Years of education: business college   Highest education level: Associate degree: occupational, Hotel manager, or vocational program  Occupational History   Occupation: Retired  Tobacco Use   Smoking status: Former    Types: Cigarettes    Quit date: 06/16/1969    Years since quitting: 52.1   Smokeless tobacco: Never  Vaping Use   Vaping Use: Never used  Substance and Sexual Activity   Alcohol use: Yes    Alcohol/week: 4.0 - 6.0 standard drinks    Types: 4 - 6 Glasses of wine per week   Drug use: No   Sexual activity: Not on file  Other Topics Concern   Not on file  Social History Narrative   Not on file   Social Determinants of Health   Financial Resource Strain: Low Risk    Difficulty of Paying Living Expenses: Not hard at all  Food Insecurity: No Food Insecurity  Worried About Charity fundraiser in the Last Year: Never true   [place] in the Last Year: Never true  Transportation Needs: No Transportation Needs   Lack of Transportation (Medical): No   Lack of Transportation (Non-Medical): No  Physical Activity: Insufficiently Active   Days of Exercise per Week: 2 days   Minutes of Exercise per Session: 20 min  Stress: Stress Concern Present   Feeling of Stress : Rather much  Social Connections: Moderately Integrated   Frequency of Communication with Friends and Family: More than three times a week   Frequency of Social Gatherings with Friends and Family: More than three times a week   Attends Religious Services: More than 4  times per year   Active Member of [employer] or Organizations: No   Attends Archivist Meetings: Never   Marital Status: Married  Human resources officer Violence: Not At Risk   Fear of Current or Ex-Partner: No   Emotionally Abused: No   Physically Abused: No   Sexually Abused: No   Family Status  Relation Name Status   Mother  Alive   Father  Deceased at age 41       MI   Brother  Deceased   Neg Hx  (Not Specified)   Family History  Problem Relation Age of Onset   Arthritis Mother    Hyperlipidemia Mother    Hypertension Mother    Alzheimer's disease Mother    Diabetes Father    CVA Father    HIV Brother    Breast cancer Neg Hx    Allergies  Allergen Reactions   Latex Itching   Neosporin [Neomycin-Bacitracin Zn-Polymyx]    Prednisone Swelling    And redness   Sulfa Antibiotics Other (See Comments)   Benzalkonium Chloride Itching and Rash    Patient Care Team: Diana Kirschner, PA-C as PCP - General (Physician Assistant) Ralene Bathe, MD (Dermatology) Arelia Sneddon, [place] (Optometry) Lucky Cowboy, Erskine Squibb, MD as Referring Physician (Vascular Surgery) Rosalene Billings., MD (Dentistry)   Medications: Outpatient Medications Prior to Visit  Medication Sig   buPROPion (WELLBUTRIN XL) 150 MG 24 hr tablet TAKE 1 TABLET(150 MG) BY MOUTH DAILY   escitalopram (LEXAPRO) 20 MG tablet TAKE 1 TABLET(20 MG) BY MOUTH DAILY   Multiple Vitamins-Minerals (WOMENS 50+ MULTI VITAMIN/MIN) TABS Take by mouth daily.   simvastatin (ZOCOR) 20 MG tablet TAKE 1 TABLET(20 MG) BY MOUTH DAILY   valACYclovir (VALTREX) 500 MG tablet TAKE 1 TABLET(500 MG) BY MOUTH TWICE DAILY AS NEEDED   [DISCONTINUED] ALPRAZolam (XANAX) 0.5 MG tablet SMARTSIG:1 Tablet(s) By Mouth   No facility-administered medications prior to visit.    Review of Systems  Constitutional: Negative.   Eyes: Negative.   Respiratory:  Positive for cough.   Cardiovascular:  Positive for palpitations.  Gastrointestinal:  Positive  for rectal pain.  Endocrine: Negative.   Genitourinary:  Positive for genital sores.  Musculoskeletal:  Positive for arthralgias, back pain, neck pain and neck stiffness.  Skin: Negative.   Allergic/Immunologic: Negative.   Neurological:  Positive for numbness.  Hematological: Negative.   Psychiatric/Behavioral: Negative.    All other systems reviewed and are negative.   Objective    Blood pressure 123/84, pulse 66, height 5\' 6"  (1.676 m), weight 147 lb 14.4 oz (67.1 kg), SpO2 99 %.   Physical Exam Constitutional:      General: She is awake.     Appearance: She is well-developed.  HENT:  Head: Normocephalic.     Right Ear: Tympanic membrane normal.     Left Ear: Tympanic membrane normal.  Eyes:     Conjunctiva/sclera: Conjunctivae normal.     Pupils: Pupils are equal, round, and reactive to light.  Neck:     Thyroid: No thyroid mass or thyromegaly.  Cardiovascular:     Rate and Rhythm: Normal rate and regular rhythm.     Heart sounds: Normal heart sounds.  Pulmonary:     Effort: Pulmonary effort is normal.     Breath sounds: Normal breath sounds.  Abdominal:     Palpations: Abdomen is soft.     Tenderness: There is no abdominal tenderness.  Musculoskeletal:     Right lower leg: No swelling.     Left lower leg: No swelling.     Comments: Arthritic changes to bilateral hands No edema or skin changes  Lymphadenopathy:     Cervical: No cervical adenopathy.  Skin:    General: Skin is warm.  Neurological:     Mental Status: She is alert and oriented to person, place, and time.  Psychiatric:        Attention and Perception: Attention normal.        Mood and Affect: Mood normal.        Speech: Speech normal.        Behavior: Behavior is cooperative.     Last depression screening scores PHQ 2/9 Scores 07/25/2021 07/16/2021 12/15/2020  PHQ - 2 Score 2 1 0  PHQ- 9 Score 2 - 1   Last fall risk screening Fall Risk  07/25/2021  Falls in the past year? 0  Number falls in  past yr: 0  Injury with Fall? 0  Risk for fall due to : No Fall Risks  Follow up -   Last Audit-C alcohol use screening Alcohol Use Disorder Test (AUDIT) 07/25/2021  1. How often do you have a drink containing alcohol? 4  2. How many drinks containing alcohol do you have on a typical day when you are drinking? 0  3. How often do you have six or more drinks on one occasion? 0  AUDIT-C Score 4  4. How often during the last year have you found that you were not able to stop drinking once you had started? -  5. How often during the last year have you failed to do what was normally expected from you because of drinking? -  6. How often during the last year have you needed a first drink in the morning to get yourself going after a heavy drinking session? -  7. How often during the last year have you had a feeling of guilt of remorse after drinking? -  8. How often during the last year have you been unable to remember what happened the night before because you had been drinking? -  9. Have you or someone else been injured as a result of your drinking? -  10. Has a relative or friend or a doctor or another health worker been concerned about your drinking or suggested you cut down? -  Alcohol Use Disorder Identification Test Final Score (AUDIT) -  Alcohol Brief Interventions/Follow-up -   A score of 3 or more in women, and 4 or more in men indicates increased risk for alcohol abuse, EXCEPT if all of the points are from question 1   No results found for any visits on 07/25/21.  Assessment & Plan    Routine Health Maintenance and  Physical Exam  Exercise Activities and Dietary recommendations  Goals      DIET - EAT MORE FRUITS AND VEGETABLES     Exercise 3x per week (30 min per time)     Recommend to exercise for 3 days a week for at least 30 minutes at a time.         Immunization History  Administered Date(s) Administered   Fluad Quad(high Dose 65+) 03/17/2019, 03/23/2020   Influenza  Split 04/03/2009, 03/01/2010, 03/08/2011, 04/06/2012   Influenza, High Dose Seasonal PF 04/19/2014, 04/05/2015, 04/11/2016, 03/26/2017, 04/08/2018   Influenza,inj,Quad PF,6+ Mos 04/07/2013   Influenza-Unspecified 03/26/2021   PFIZER(Purple Top)SARS-COV-2 Vaccination 06/21/2019, 07/12/2019, 04/20/2020   Pfizer Covid-19 Vaccine Bivalent Booster 65yrs & up 03/05/2021   Pneumococcal Conjugate-13 06/03/2014   Pneumococcal Polysaccharide-23 06/17/1998, 04/07/2013   Tdap 04/09/2005, 06/02/2015   Zoster Recombinat (Shingrix) 12/02/2017, 04/07/2018   Zoster, Live 01/13/2008    Health Maintenance  Topic Date Due   MAMMOGRAM  07/17/2021   DEXA SCAN  07/22/2022   TETANUS/TDAP  06/01/2025   COLONOSCOPY (Pts 45-21yrs Insurance coverage will need to be confirmed)  07/27/2025   Pneumonia Vaccine 25+ Years old  Completed   INFLUENZA VACCINE  Completed   COVID-19 Vaccine  Completed   Hepatitis C Screening  Completed   Zoster Vaccines- Shingrix  Completed   HPV VACCINES  Aged Out    Discussed health benefits of physical activity, and encouraged her to engage in regular exercise appropriate for her age and condition.  Problem List Items Addressed This Visit       Musculoskeletal and Integument   Arthritis of both hands    Chronic and worse in the winter. Advised of arthritis supplements -- glucosamine and chondroitin to take daily to see if this brings any relief. Also reviewed topical and oral CBD agents that might help with pain and stiffness as well. Mutually agree to try to avoid chronic NSAID use Will continue to monitor        Other   Hypercholesterolemia    Currently on statin will recheck lipids, goal LDL <70      Relevant Orders   Comprehensive Metabolic Panel (CMET)   Lipid panel   Moderate episode of recurrent major depressive disorder (HCC)    Stable on current medications      Relevant Orders   CBC   Other Visit Diagnoses     Encounter for health maintenance  examination    -  Primary   Relevant Orders   Comprehensive Metabolic Panel (CMET)   Lipid panel   CBC   HgB A1c   Prediabetes       Relevant Orders   CBC   HgB A1c   Encounter for screening mammogram for breast cancer       Relevant Orders   MM 3D SCREEN BREAST BILATERAL        Return in about 1 year (around 07/25/2022) for CPE.     I, Diana Kirschner, PA-C have reviewed all documentation for this visit. The documentation on  07/25/2021 for the exam, diagnosis, procedures, and orders are all accurate and complete.    Diana Kirschner, PA-C  [facility] [phone] (phone) [phone] (fax)  [place]

## 2021-07-25 NOTE — Assessment & Plan Note (Signed)
Chronic and worse in the winter. Advised of arthritis supplements -- glucosamine and chondroitin to take daily to see if this brings any relief. Also reviewed topical and oral CBD agents that might help with pain and stiffness as well. Mutually agree to try to avoid chronic NSAID use Will continue to monitor

## 2021-07-25 NOTE — Assessment & Plan Note (Signed)
Stable on current medications 

## 2021-07-25 NOTE — Assessment & Plan Note (Signed)
Currently on statin will recheck lipids, goal LDL <70

## 2021-07-25 NOTE — Patient Instructions (Addendum)
Many people with arthritis -- especially osteoarthritis -- use supplements in their diet to ease the pain of arthritis. Glucosamine and chondroitin are the most well-known Glucosamine and chondroitin sulfate are components of normal cartilage. In the body, they are the building blocks for cartilage and appear to stimulate the body to make more cartilage. NOW Supplements, Glucosamine & Chondroitin with MSM, Joint Health, Mobility and Comfort  Cbd websites -Janace Aris -Most products w/ CBD at drugstores are good as well!

## 2021-07-26 LAB — COMPREHENSIVE METABOLIC PANEL
ALT: 7 IU/L (ref 0–32)
AST: 20 IU/L (ref 0–40)
Albumin/Globulin Ratio: 2.2 (ref 1.2–2.2)
Albumin: 4.6 g/dL (ref 3.7–4.7)
Alkaline Phosphatase: 83 IU/L (ref 44–121)
BUN/Creatinine Ratio: 17 (ref 12–28)
BUN: 17 mg/dL (ref 8–27)
Bilirubin Total: 0.4 mg/dL (ref 0.0–1.2)
CO2: 25 mmol/L (ref 20–29)
Calcium: 9.9 mg/dL (ref 8.7–10.3)
Chloride: 102 mmol/L (ref 96–106)
Creatinine, Ser: 1 mg/dL (ref 0.57–1.00)
Globulin, Total: 2.1 g/dL (ref 1.5–4.5)
Glucose: 76 mg/dL (ref 70–99)
Potassium: 4.4 mmol/L (ref 3.5–5.2)
Sodium: 142 mmol/L (ref 134–144)
Total Protein: 6.7 g/dL (ref 6.0–8.5)
eGFR: 59 mL/min/{1.73_m2} — ABNORMAL LOW (ref >59)

## 2021-07-26 LAB — CBC
Hematocrit: 40.3 % (ref 34.0–46.6)
Hemoglobin: 13.4 g/dL (ref 11.1–15.9)
MCH: 32.6 pg (ref 26.6–33.0)
MCHC: 33.3 g/dL (ref 31.5–35.7)
MCV: 98 fL — ABNORMAL HIGH (ref 79–97)
Platelets: 288 10*3/uL (ref 150–450)
RBC: 4.11 x10E6/uL (ref 3.77–5.28)
RDW: 12.2 % (ref 11.7–15.4)
WBC: 6 10*3/uL (ref 3.4–10.8)

## 2021-07-26 LAB — LIPID PANEL
Chol/HDL Ratio: 2.8 ratio (ref 0.0–4.4)
Cholesterol, Total: 226 mg/dL — ABNORMAL HIGH (ref 100–199)
HDL: 80 mg/dL (ref >39)
LDL Chol Calc (NIH): 127 mg/dL — ABNORMAL HIGH (ref 0–99)
Triglycerides: 112 mg/dL (ref 0–149)
VLDL Cholesterol Cal: 19 mg/dL (ref 5–40)

## 2021-07-26 LAB — HEMOGLOBIN A1C
Est. average glucose Bld gHb Est-mCnc: 126 mg/dL
Hgb A1c MFr Bld: 6 % — ABNORMAL HIGH (ref 4.8–5.6)

## 2021-08-01 ENCOUNTER — Ambulatory Visit (INDEPENDENT_AMBULATORY_CARE_PROVIDER_SITE_OTHER): Payer: Medicare Other | Admitting: Nurse Practitioner

## 2021-08-01 ENCOUNTER — Other Ambulatory Visit: Payer: Self-pay

## 2021-08-01 ENCOUNTER — Encounter (INDEPENDENT_AMBULATORY_CARE_PROVIDER_SITE_OTHER): Payer: Self-pay | Admitting: Nurse Practitioner

## 2021-08-01 VITALS — BP 112/73 | HR 63 | Ht 66.0 in | Wt 150.0 lb

## 2021-08-01 DIAGNOSIS — I83813 Varicose veins of bilateral lower extremities with pain: Secondary | ICD-10-CM

## 2021-08-10 ENCOUNTER — Ambulatory Visit
Admission: RE | Admit: 2021-08-10 | Discharge: 2021-08-10 | Disposition: A | Discharge status: Home / Self Care | Payer: Medicare Other | Source: Ambulatory Visit | Attending: Physician Assistant | Admitting: Physician Assistant

## 2021-08-10 ENCOUNTER — Other Ambulatory Visit: Payer: Self-pay

## 2021-08-10 DIAGNOSIS — Z1231 Encounter for screening mammogram for malignant neoplasm of breast: Secondary | ICD-10-CM | POA: Insufficient documentation

## 2021-08-11 ENCOUNTER — Encounter (INDEPENDENT_AMBULATORY_CARE_PROVIDER_SITE_OTHER): Payer: Self-pay | Admitting: Nurse Practitioner

## 2021-08-11 NOTE — Progress Notes (Signed)
Varicose veins of bilateral  lower extremity with inflammation (454.1  I83.10) Current Plans   Indication: Patient presents with symptomatic varicose veins of the bilateral  lower extremity.   Procedure: Sclerotherapy using hypertonic saline mixed with 1% Lidocaine was performed on the bilateral lower extremity. Compression wraps were placed. The patient tolerated the procedure well. 

## 2021-08-13 DIAGNOSIS — L600 Ingrowing nail: Secondary | ICD-10-CM | POA: Diagnosis not present

## 2021-08-13 DIAGNOSIS — M79674 Pain in right toe(s): Secondary | ICD-10-CM | POA: Diagnosis not present

## 2021-08-13 DIAGNOSIS — B351 Tinea unguium: Secondary | ICD-10-CM | POA: Diagnosis not present

## 2021-08-13 DIAGNOSIS — I739 Peripheral vascular disease, unspecified: Secondary | ICD-10-CM | POA: Diagnosis not present

## 2021-08-20 ENCOUNTER — Other Ambulatory Visit (INDEPENDENT_AMBULATORY_CARE_PROVIDER_SITE_OTHER): Payer: Self-pay | Admitting: Podiatry

## 2021-08-20 DIAGNOSIS — I739 Peripheral vascular disease, unspecified: Secondary | ICD-10-CM

## 2021-08-21 ENCOUNTER — Ambulatory Visit (INDEPENDENT_AMBULATORY_CARE_PROVIDER_SITE_OTHER): Payer: Medicare Other

## 2021-08-21 ENCOUNTER — Other Ambulatory Visit: Payer: Self-pay

## 2021-08-21 DIAGNOSIS — I739 Peripheral vascular disease, unspecified: Secondary | ICD-10-CM | POA: Diagnosis not present

## 2021-09-12 ENCOUNTER — Other Ambulatory Visit: Payer: Self-pay

## 2021-09-12 ENCOUNTER — Encounter (INDEPENDENT_AMBULATORY_CARE_PROVIDER_SITE_OTHER): Payer: Self-pay | Admitting: Nurse Practitioner

## 2021-09-12 ENCOUNTER — Ambulatory Visit (INDEPENDENT_AMBULATORY_CARE_PROVIDER_SITE_OTHER): Payer: Medicare Other | Admitting: Nurse Practitioner

## 2021-09-12 VITALS — BP 126/76 | HR 66 | Resp 17 | Ht 66.0 in | Wt 150.4 lb

## 2021-09-12 DIAGNOSIS — I83813 Varicose veins of bilateral lower extremities with pain: Secondary | ICD-10-CM | POA: Diagnosis not present

## 2021-09-20 ENCOUNTER — Other Ambulatory Visit: Payer: Self-pay | Admitting: Family Medicine

## 2021-09-20 DIAGNOSIS — F419 Anxiety disorder, unspecified: Secondary | ICD-10-CM

## 2021-09-24 ENCOUNTER — Encounter (INDEPENDENT_AMBULATORY_CARE_PROVIDER_SITE_OTHER): Payer: Self-pay | Admitting: Nurse Practitioner

## 2021-09-24 NOTE — Progress Notes (Signed)
? ?Subjective:  ? ? Patient ID: Diana Blanchard, female    DOB: [birth year], 76 y.o.   MRN: [id number] ?No chief complaint on file. ? ? ?Diana Blanchard is a 76 year old female that returns today for evaluation of her lower extremities in regards to her varicose veins.  The patient has previously underwent endovenous laser ablation but continues to have areas that are painful and uncomfortable.  She has been given 2 mg of sclerotherapy however there is still numerous noted no varicosities that are tender and painful for the patient.  She continues to adhere to conservative therapy including use of compression socks and elevation. ?  ? ? ?Review of Systems  ?All other systems reviewed and are negative. ? ?   ?Objective:  ? Physical Exam ?Vitals reviewed.  ?HENT:  ?   Head: Normocephalic.  ?Cardiovascular:  ?   Rate and Rhythm: Normal rate.  ?   Pulses: Normal pulses.  ?Pulmonary:  ?   Effort: Pulmonary effort is normal.  ?Skin: ?   General: Skin is warm and dry.  ?Neurological:  ?   Mental Status: She is alert and oriented to person, place, and time.  ?Psychiatric:     ?   Mood and Affect: Mood normal.     ?   Behavior: Behavior normal.     ?   Thought Content: Thought content normal.     ?   Judgment: Judgment normal.  ? ? ?BP 126/76 (BP Location: Left Arm)   Pulse 66   Resp 17   Ht '5\' 6"'$  (1.676 m)   Wt 150 lb 6.4 oz (68.2 kg)   BMI 24.28 kg/m?  ? ?Past Medical History:  ?Diagnosis Date  ? Anxiety   ? Arthritis   ? fingers  ? Billowing mitral valve   ? Carpal tunnel syndrome   ? Depression   ? Dysplastic nevus 05/20/2006  ? R lat buttock - moderate  ? Dysplastic nevus 12/31/2006  ? R back braline - moderate, excised 04/07/2007  ? Dysplastic nevus 10/06/2008  ? R back paraspinal med to inf scapula - mild to mod  ? GERD (gastroesophageal reflux disease)   ? Headache   ? Hypercholesterolemia   ? Mitral valve prolapse   ? Varicose vein of leg   ? ? ?Social History  ? ?Socioeconomic History  ? Marital status: Married  ?   Spouse name: Merry Proud  ? Number of children: 1  ? Years of education: business college  ? Highest education level: Associate degree: occupational, Hotel manager, or vocational program  ?Occupational History  ? Occupation: Retired  ?Tobacco Use  ? Smoking status: Former  ?  Types: Cigarettes  ?  Quit date: 06/16/1969  ?  Years since quitting: 52.3  ? Smokeless tobacco: Never  ?Vaping Use  ? Vaping Use: Never used  ?Substance and Sexual Activity  ? Alcohol use: Yes  ?  Alcohol/week: 4.0 - 6.0 standard drinks  ?  Types: 4 - 6 Glasses of wine per week  ? Drug use: No  ? Sexual activity: Not on file  ?Other Topics Concern  ? Not on file  ?Social History Narrative  ? Not on file  ? ?Social Determinants of Health  ? ?Financial Resource Strain: Low Risk   ? Difficulty of Paying Living Expenses: Not hard at all  ?Food Insecurity: No Food Insecurity  ? Worried About Charity fundraiser in the Last Year: Never true  ? Ran Out of Food in the Last Year:  Never true  ?Transportation Needs: No Transportation Needs  ? Lack of Transportation (Medical): No  ? Lack of Transportation (Non-Medical): No  ?Physical Activity: Insufficiently Active  ? Days of Exercise per Week: 2 days  ? Minutes of Exercise per Session: 20 min  ?Stress: Stress Concern Present  ? Feeling of Stress : Rather much  ?Social Connections: Moderately Integrated  ? Frequency of Communication with Friends and Family: More than three times a week  ? Frequency of Social Gatherings with Friends and Family: More than three times a week  ? Attends Religious Services: More than 4 times per year  ? Active Member of Clubs or Organizations: No  ? Attends Archivist Meetings: Never  ? Marital Status: Married  ?Intimate Partner Violence: Not At Risk  ? Fear of Current or Ex-Partner: No  ? Emotionally Abused: No  ? Physically Abused: No  ? Sexually Abused: No  ? ? ?Past Surgical History:  ?Procedure Laterality Date  ? bowel obstruction  1990  ? BREAST CYST ASPIRATION Right  1980's  ? COLONOSCOPY WITH PROPOFOL N/A 06/29/2015  ? Procedure: COLONOSCOPY WITH PROPOFOL;  Surgeon: Manya Silvas, MD;  Location: [facility] ENDOSCOPY;  Service: Endoscopy;  Laterality: N/A;  ? COLONOSCOPY WITH PROPOFOL N/A 07/27/2020  ? Procedure: COLONOSCOPY WITH PROPOFOL;  Surgeon: Lucilla Lame, MD;  Location: [place];  Service: Endoscopy;  Laterality: N/A;  priority 4  ? cyst removed  1981  ? on the ovary  ? LAPAROSCOPY  1970  ? complications from IUD  ? POLYPECTOMY N/A 07/27/2020  ? Procedure: POLYPECTOMY;  Surgeon: Lucilla Lame, MD;  Location: [place];  Service: Endoscopy;  Laterality: N/A;  ? ROTATOR CUFF REPAIR Right 06/2011  ? TONSILLECTOMY AND ADENOIDECTOMY  1953  ? ? ?Family History  ?Problem Relation Age of Onset  ? Arthritis Mother   ? Hyperlipidemia Mother   ? Hypertension Mother   ? Alzheimer's disease Mother   ? Diabetes Father   ? CVA Father   ? HIV Brother   ? Breast cancer Neg Hx   ? ? ?Allergies  ?Allergen Reactions  ? Latex Itching  ? Neosporin [Neomycin-Bacitracin Zn-Polymyx]   ? Prednisone Swelling  ?  And redness  ? Sulfa Antibiotics Other (See Comments)  ? Benzalkonium Chloride Itching and Rash  ? ? ? ?  Latest Ref Rng & Units 07/25/2021  ?  2:39 PM 08/04/2020  ? 11:34 AM 07/28/2019  ? 11:10 AM  ?CBC  ?WBC 3.4 - 10.8 x10E3/uL 6.0   6.6   4.9    ?Hemoglobin 11.1 - 15.9 g/dL 13.4   13.1   13.3    ?Hematocrit 34.0 - 46.6 % 40.3   38.9   39.0    ?Platelets 150 - 450 x10E3/uL 288   279   270    ? ? ? ? ?CMP  ?   ?Component Value Date/Time  ? NA 142 07/25/2021 1439  ? K 4.4 07/25/2021 1439  ? CL 102 07/25/2021 1439  ? CO2 25 07/25/2021 1439  ? GLUCOSE 76 07/25/2021 1439  ? BUN 17 07/25/2021 1439  ? CREATININE 1.00 07/25/2021 1439  ? CALCIUM 9.9 07/25/2021 1439  ? PROT 6.7 07/25/2021 1439  ? ALBUMIN 4.6 07/25/2021 1439  ? AST 20 07/25/2021 1439  ? ALT 7 07/25/2021 1439  ? ALKPHOS 83 07/25/2021 1439  ? BILITOT 0.4 07/25/2021 1439  ? GFRNONAA 55 (L) 08/04/2020 1134  ? GFRAA 64  08/04/2020 1134  ? ? ? ?  VAS [place] ABI WITH/WO TBI ? ?Result Date: 08/27/2021 ? LOWER EXTREMITY DOPPLER STUDY Patient Name:  Diana Blanchard  Date of Exam:   08/21/2021 Medical Rec #: [id number]    Accession #:    [id number] Date of Birth: 1946/06/04   Patient Gender: F Patient Age:   73 years Exam Location:  [facility] Procedure:      VAS [place] ABI WITH/WO TBI Referring Phys: --------------------------------------------------------------------------------  Other Factors: Pediatrist unable to feel pulse in foot bilaterally.  Performing Technologist: Concha Norway RVT  Examination Guidelines: A complete evaluation includes at minimum, Doppler waveform signals and systolic blood pressure reading at the level of bilateral brachial, anterior tibial, and posterior tibial arteries, when vessel segments are accessible. Bilateral testing is considered an integral part of a complete examination. Photoelectric Plethysmograph (PPG) waveforms and toe systolic pressure readings are included as required and additional duplex testing as needed. Limited examinations for reoccurring indications may be performed as noted.  ABI Findings: +---------+------------------+-----+---------+--------+ Right    Rt Pressure (mmHg)IndexWaveform Comment  +---------+------------------+-----+---------+--------+ Brachial 134                                      +---------+------------------+-----+---------+--------+ ATA      152               1.13 triphasic         +---------+------------------+-----+---------+--------+ PTA      153               1.14 triphasic         +---------+------------------+-----+---------+--------+ Great Toe125               0.93 Normal            +---------+------------------+-----+---------+--------+ +---------+------------------+-----+---------+-------+ Left     Lt Pressure (mmHg)IndexWaveform Comment +---------+------------------+-----+---------+-------+ Brachial 134                                      +---------+------------------+-----+---------+-------+ ATA      152               1.13 triphasic        +---------+------------------+-----+---------+-------+ PTA      146               1.09 triphasic        +---------+------------------+--

## 2021-10-01 ENCOUNTER — Ambulatory Visit: Payer: Medicare Other | Admitting: Dermatology

## 2021-10-01 ENCOUNTER — Encounter: Payer: Self-pay | Admitting: Dermatology

## 2021-10-01 ENCOUNTER — Telehealth: Payer: Self-pay | Admitting: Physician Assistant

## 2021-10-01 DIAGNOSIS — Z86018 Personal history of other benign neoplasm: Secondary | ICD-10-CM

## 2021-10-01 DIAGNOSIS — L570 Actinic keratosis: Secondary | ICD-10-CM

## 2021-10-01 DIAGNOSIS — Z1283 Encounter for screening for malignant neoplasm of skin: Secondary | ICD-10-CM | POA: Diagnosis not present

## 2021-10-01 DIAGNOSIS — L578 Other skin changes due to chronic exposure to nonionizing radiation: Secondary | ICD-10-CM

## 2021-10-01 DIAGNOSIS — D229 Melanocytic nevi, unspecified: Secondary | ICD-10-CM | POA: Diagnosis not present

## 2021-10-01 DIAGNOSIS — E7800 Pure hypercholesterolemia, unspecified: Secondary | ICD-10-CM

## 2021-10-01 DIAGNOSIS — D1800 Hemangioma unspecified site: Secondary | ICD-10-CM

## 2021-10-01 DIAGNOSIS — L720 Epidermal cyst: Secondary | ICD-10-CM

## 2021-10-01 DIAGNOSIS — L814 Other melanin hyperpigmentation: Secondary | ICD-10-CM | POA: Diagnosis not present

## 2021-10-01 DIAGNOSIS — L821 Other seborrheic keratosis: Secondary | ICD-10-CM

## 2021-10-01 DIAGNOSIS — L820 Inflamed seborrheic keratosis: Secondary | ICD-10-CM | POA: Diagnosis not present

## 2021-10-01 MED ORDER — SIMVASTATIN 40 MG PO TABS: ORAL_TABLET | ORAL | 0 refills | Status: DC

## 2021-10-01 NOTE — Telephone Encounter (Signed)
[facility] faxed refill request for the following medications:   simvastatin (ZOCOR) 20 MG tablet   Please advise.  

## 2021-10-01 NOTE — Progress Notes (Signed)
? ?Follow-Up Visit ?  ?Subjective  ?Diana Blanchard is a 76 y.o. female who presents for the following: Annual Exam (Here for yearly skin cancer screening. Hx of dysplastic nevi. No personal Hx of skin cancer. Scheduled to go to [place] next week, would like areas treated but unsure of healing time/sun exposure). ?The patient presents for Total-Body Skin Exam (TBSE) for skin cancer screening and mole check.  The patient has spots, moles and lesions to be evaluated, some may be new or changing and the patient has concerns that these could be cancer. ? ?The following portions of the chart were reviewed this encounter and updated as appropriate:  Tobacco  Allergies  Meds  Problems  Med Hx  Surg Hx  Fam Hx   ?  ?Review of Systems: No other skin or systemic complaints except as noted in HPI or Assessment and Plan. ? ?Objective  ?Well appearing patient in no apparent distress; mood and affect are within normal limits. ? ?A full examination was performed including scalp, head, eyes, ears, nose, lips, neck, chest, axillae, abdomen, back, buttocks, bilateral upper extremities, bilateral lower extremities, hands, feet, fingers, toes, fingernails, and toenails. All findings within normal limits unless otherwise noted below. ? ?trunk and extremities x25 (25) ?Erythematous keratotic or waxy stuck-on papule or plaque. ? ?Nose x1, left nasal ala x1 (2) ?Erythematous thin papules/macules with gritty scale.  ? ? ?Assessment & Plan  ? ?Lentigines ?- Scattered tan macules ?- Due to sun exposure ?- Benign-appearing, observe ?- Recommend daily broad spectrum sunscreen SPF 30+ to sun-exposed areas, reapply every 2 hours as needed. ?- Call for any changes ? ?Seborrheic Keratoses ?- Stuck-on, waxy, tan-brown papules and/or plaques  ?- Benign-appearing ?- Discussed benign etiology and prognosis. ?- Observe ?- Call for any changes ? ?Melanocytic Nevi ?- Tan-brown and/or pink-flesh-colored symmetric macules and papules ?- Benign appearing  on exam today ?- Observation ?- Call clinic for new or changing moles ?- Recommend daily use of broad spectrum spf 30+ sunscreen to sun-exposed areas.  ? ?Hemangiomas ?- Red papules ?- Discussed benign nature ?- Observe ?- Call for any changes ? ?Actinic Damage ?- Chronic condition, secondary to cumulative UV/sun exposure ?- diffuse scaly erythematous macules with underlying dyspigmentation ?- Recommend daily broad spectrum sunscreen SPF 30+ to sun-exposed areas, reapply every 2 hours as needed.  ?- Staying in the shade or wearing long sleeves, sun glasses (UVA+UVB protection) and wide brim hats (4-inch brim around the entire circumference of the hat) are also recommended for sun protection.  ?- Call for new or changing lesions. ? ?History of Dysplastic Nevi. Right lat buttock, right back braline, right back paraspinal med to inf scapula. ?- No evidence of recurrence today ?- Recommend regular full body skin exams ?- Recommend daily broad spectrum sunscreen SPF 30+ to sun-exposed areas, reapply every 2 hours as needed.  ?- Call if any new or changing lesions are noted between office visits  ? ?[place]. Face. ?- tiny firm white papules ?- type of cyst ?- benign ?- may be extracted if symptomatic ?- observe ? ?Skin cancer screening performed today. ? ?Inflamed seborrheic keratosis (25) ?trunk and extremities x25 ?Irritated ?Destruction of lesion - trunk and extremities x25 ?Complexity: simple   ?Destruction method: cryotherapy   ?Informed consent: discussed and consent obtained   ?Timeout:  patient name, date of birth, surgical site, and procedure verified ?Lesion destroyed using liquid nitrogen: Yes   ?Region frozen until ice ball extended beyond lesion: Yes   ?Outcome: patient tolerated  procedure well with no complications   ?Post-procedure details: wound care instructions given   ? ?AK (actinic keratosis) (2) ?Nose x1, left nasal ala x1 ?Actinic keratoses are precancerous spots that appear secondary to cumulative UV  radiation exposure/sun exposure over time. They are chronic with expected duration over 1 year. A portion of actinic keratoses will progress to squamous cell carcinoma of the skin. It is not possible to reliably predict which spots will progress to skin cancer and so treatment is recommended to prevent development of skin cancer. ? ?Recommend daily broad spectrum sunscreen SPF 30+ to sun-exposed areas, reapply every 2 hours as needed.  ?Recommend staying in the shade or wearing long sleeves, sun glasses (UVA+UVB protection) and wide brim hats (4-inch brim around the entire circumference of the hat). ?Call for new or changing lesions. ? ?Destruction of lesion - Nose x1, left nasal ala x1 ?Complexity: simple   ?Destruction method: cryotherapy   ?Informed consent: discussed and consent obtained   ?Timeout:  patient name, date of birth, surgical site, and procedure verified ?Lesion destroyed using liquid nitrogen: Yes   ?Region frozen until ice ball extended beyond lesion: Yes   ?Outcome: patient tolerated procedure well with no complications   ?Post-procedure details: wound care instructions given   ? ?Skin cancer screening ? ?Return in about 1 year (around 10/02/2022) for TBSE, HxDN. ? ?I, Emelia Salisbury, CMA, am acting as scribe for Sarina Ser, MD. ?Documentation: I have reviewed the above documentation for accuracy and completeness, and I agree with the above. ? ?Sarina Ser, MD ? ? ?

## 2021-10-01 NOTE — Telephone Encounter (Signed)
FYI-Refill request via fax for 20 mg of Simvastatin. ?Simvastatin '40mg'$  sent in for 1 tablet a day since patient was taking '20mg'$  BID. ?

## 2021-10-01 NOTE — Patient Instructions (Addendum)
Cryotherapy Aftercare ? ?Wash gently with soap and water everyday.   ?Apply Vaseline and Band-Aid daily until healed.  ? ?Prior to procedure, discussed risks of blister formation, small wound, skin dyspigmentation, or rare scar following cryotherapy. Recommend Vaseline ointment to treated areas while healing.  ? ? ?Recommend daily broad spectrum sunscreen SPF 30+ to sun-exposed areas, reapply every 2 hours as needed. Call for new or changing lesions.  ?Staying in the shade or wearing long sleeves, sun glasses (UVA+UVB protection) and wide brim hats (4-inch brim around the entire circumference of the hat) are also recommended for sun protection.  ? ?Melanoma ABCDEs ? ?Melanoma is the most dangerous type of skin cancer, and is the leading cause of death from skin disease.  You are more likely to develop melanoma if you: ?Have light-colored skin, light-colored eyes, or red or blond hair ?Spend a lot of time in the sun ?Tan regularly, either outdoors or in a tanning bed ?Have had blistering sunburns, especially during childhood ?Have a close family member who has had a melanoma ?Have atypical moles or large birthmarks ? ?Early detection of melanoma is key since treatment is typically straightforward and cure rates are extremely high if we catch it early.  ? ?The first sign of melanoma is often a change in a mole or a new dark spot.  The ABCDE system is a way of remembering the signs of melanoma. ? ?A for asymmetry:  The two halves do not match. ?B for border:  The edges of the growth are irregular. ?C for color:  A mixture of colors are present instead of an even brown color. ?D for diameter:  Melanomas are usually (but not always) greater than 21m - the size of a pencil eraser. ?E for evolution:  The spot keeps changing in size, shape, and color. ? ?Please check your skin once per month between visits. You can use a small mirror in front and a large mirror behind you to keep an eye on the back side or your body.  ? ?If  you see any new or changing lesions before your next follow-up, please call to schedule a visit. ? ?Please continue daily skin protection including broad spectrum sunscreen SPF 30+ to sun-exposed areas, reapplying every 2 hours as needed when you're outdoors.  ? ?Staying in the shade or wearing long sleeves, sun glasses (UVA+UVB protection) and wide brim hats (4-inch brim around the entire circumference of the hat) are also recommended for sun protection.   ? ?If You Need Anything After Your Visit ? ?If you have any questions or concerns for your doctor, please call our main line at [phone] press option 4 to reach your doctor's medical assistant. If no one answers, please leave a voicemail as directed and we will return your call as soon as possible. Messages left after 4 pm will be answered the following business day.  ? ?You may also send [place] message via MyChart. We typically respond to MyChart messages within 1-2 business days. ? ?For prescription refills, please ask your pharmacy to contact our office. Our fax number is [phone] ? ?If you have an urgent issue when the clinic is closed that cannot wait until the next business day, you can page your doctor at the number below.   ? ?Please note that while we do our best to be available for urgent issues outside of office hours, we are not available 24/7.  ? ?If you have an urgent issue and are unable  to reach [place], you may choose to seek medical care at your doctor's office, retail clinic, urgent care center, or emergency room. ? ?If you have a medical emergency, please immediately call 911 or go to the emergency department. ? ?Pager Numbers ? ?- Dr. Nehemiah Massed: [phone] ? ?- Dr. Laurence Ferrari: [phone] ? ?- Dr. Nicole Kindred: [phone] ? ?In the event of inclement weather, please call our main line at [phone] for an update on the status of any delays or closures. ? ?Dermatology Medication Tips: ?Please keep the boxes that topical medications come in in  order to help keep track of the instructions about where and how to use these. Pharmacies typically print the medication instructions only on the boxes and not directly on the medication tubes.  ? ?If your medication is too expensive, please contact our office at [phone] option 4 or send [place] a message through [place].  ? ?We are unable to tell what your co-pay for medications will be in advance as this is different depending on your insurance coverage. However, we may be able to find a substitute medication at lower cost or fill out paperwork to get insurance to cover a needed medication.  ? ?If a prior authorization is required to get your medication covered by your insurance company, please allow [place] 1-2 business days to complete this process. ? ?Drug prices often vary depending on where the prescription is filled and some pharmacies may offer cheaper prices. ? ?The website www.goodrx.com contains coupons for medications through different pharmacies. The prices here do not account for what the cost may be with help from insurance (it may be cheaper with your insurance), but the website can give you the price if you did not use any insurance.  ?- You can print the associated coupon and take it with your prescription to the pharmacy.  ?- You may also stop by our office during regular business hours and pick up a GoodRx coupon card.  ?- If you need your prescription sent electronically to a different pharmacy, notify our office through [facility] or by phone at [phone] option 4. ? ? ? ? ?Si Usted Necesita Algo Despu?s de Su Visita ? ?Tambi?n puede enviarnos un mensaje a trav?s de MyChart. Por lo general respondemos a los mensajes de MyChart en el transcurso de 1 a 2 d?as h?biles. ? ?Para renovar recetas, por favor pida a su farmacia que se ponga en contacto con nuestra oficina. Nuestro n?mero de fax es el [phone]. ? ?Si tiene un asunto urgente cuando la cl?nica est? cerrada y que no puede  esperar hasta el siguiente d?a h?bil, puede llamar/localizar a su doctor(a) al n?mero que aparece a continuaci?n.  ? ?Por favor, tenga en cuenta que aunque hacemos todo lo posible para estar disponibles para asuntos urgentes fuera del horario de oficina, no estamos disponibles las 24 horas del d?a, los 7 d?as de la semana.  ? ?Si tiene un problema urgente y no puede comunicarse con nosotros, puede optar por buscar atenci?n m?dica  en el consultorio de su doctor(a), en una cl?nica privada, en un centro de atenci?n urgente o en una sala de emergencias. ? ?Si tiene Engineer, maintenance (IT) m?dica, por favor llame inmediatamente al 911 o vaya a la sala de emergencias. ? ?N?meros de b?per ? ?- Dr. Nehemiah Massed: [phone] ? ?- Dra. Moye: [phone] ? ?- Dra. Nicole Kindred: [phone] ? ?En caso de inclemencias del tiempo, por favor llame a nuestra l?nea principal al [phone] para una actualizaci?n sobre el [place] de  cualquier retraso o cierre. ? ?Consejos para la medicaci?n en dermatolog?a: ?Por favor, guarde las cajas en las que vienen los medicamentos de uso t?pico para ayudarle a seguir las instrucciones sobre d?nde y c?mo usarlos. Las farmacias generalmente imprimen las instrucciones del medicamento s?lo en las cajas y no directamente en los tubos del [place].  ? ?Si su medicamento es muy caro, por favor, p?ngase en contacto con Zigmund Daniel llamando al [phone] y presione la opci?n 4 o env?enos un mensaje a trav?s de MyChart.  ? ?No podemos decirle cu?l ser? su copago por los medicamentos por adelantado ya que esto es diferente dependiendo de la cobertura de su seguro. Sin embargo, es posible que podamos encontrar un medicamento sustituto a Electrical engineer un formulario para que el seguro cubra el medicamento que se considera necesario.  ? ?Si se requiere Ardelia Mems autorizaci?n previa para que su compa??a de seguros [place] su medicamento, por favor perm?tanos de 1 a 2 d?as h?biles para completar este proceso. ? ?Los  precios de los medicamentos var?an con frecuencia dependiendo del Environmental consultant de d?nde se surte la receta y alguna farmacias pueden ofrecer precios m?s baratos. ? ?El sitio web www.goodrx.com tiene cupones para

## 2021-10-09 ENCOUNTER — Encounter: Payer: Self-pay | Admitting: Dermatology

## 2021-10-10 ENCOUNTER — Other Ambulatory Visit: Payer: Self-pay | Admitting: Physician Assistant

## 2021-10-10 DIAGNOSIS — B009 Herpesviral infection, unspecified: Secondary | ICD-10-CM

## 2021-10-25 DIAGNOSIS — H2513 Age-related nuclear cataract, bilateral: Secondary | ICD-10-CM | POA: Diagnosis not present

## 2021-10-26 ENCOUNTER — Ambulatory Visit (INDEPENDENT_AMBULATORY_CARE_PROVIDER_SITE_OTHER): Payer: Medicare Other | Admitting: Nurse Practitioner

## 2021-10-26 ENCOUNTER — Encounter (INDEPENDENT_AMBULATORY_CARE_PROVIDER_SITE_OTHER): Payer: Self-pay | Admitting: Nurse Practitioner

## 2021-10-26 VITALS — BP 130/82 | HR 61 | Resp 16 | Wt 150.0 lb

## 2021-10-26 DIAGNOSIS — I83813 Varicose veins of bilateral lower extremities with pain: Secondary | ICD-10-CM | POA: Diagnosis not present

## 2021-10-27 ENCOUNTER — Encounter (INDEPENDENT_AMBULATORY_CARE_PROVIDER_SITE_OTHER): Payer: Self-pay | Admitting: Nurse Practitioner

## 2021-10-27 NOTE — Progress Notes (Signed)
Varicose veins of bilateral  lower extremity with inflammation (454.1  I83.10) Current Plans   Indication: Patient presents with symptomatic varicose veins of the bilateral  lower extremity.   Procedure: Sclerotherapy using hypertonic saline mixed with 1% Lidocaine was performed on the bilateral lower extremity. Compression wraps were placed. The patient tolerated the procedure well. 

## 2021-11-23 ENCOUNTER — Encounter (INDEPENDENT_AMBULATORY_CARE_PROVIDER_SITE_OTHER): Payer: Self-pay | Admitting: Nurse Practitioner

## 2021-11-23 ENCOUNTER — Ambulatory Visit (INDEPENDENT_AMBULATORY_CARE_PROVIDER_SITE_OTHER): Payer: Medicare Other | Admitting: Nurse Practitioner

## 2021-11-23 VITALS — BP 130/76 | HR 58 | Resp 16 | Ht 66.0 in | Wt 151.0 lb

## 2021-11-23 DIAGNOSIS — I83813 Varicose veins of bilateral lower extremities with pain: Secondary | ICD-10-CM | POA: Diagnosis not present

## 2021-11-26 ENCOUNTER — Encounter (INDEPENDENT_AMBULATORY_CARE_PROVIDER_SITE_OTHER): Payer: Self-pay | Admitting: Nurse Practitioner

## 2021-11-26 NOTE — Progress Notes (Signed)
Varicose veins of right  lower extremity with inflammation (454.1  I83.10) Current Plans   Indication: Patient presents with symptomatic varicose veins of the right  lower extremity.   Procedure: Sclerotherapy using hypertonic saline mixed with 1% Lidocaine was performed on the right lower extremity. Compression wraps were placed. The patient tolerated the procedure well. 

## 2021-12-16 ENCOUNTER — Other Ambulatory Visit: Payer: Self-pay | Admitting: Physician Assistant

## 2021-12-16 DIAGNOSIS — F419 Anxiety disorder, unspecified: Secondary | ICD-10-CM

## 2021-12-26 ENCOUNTER — Other Ambulatory Visit: Payer: Self-pay | Admitting: Physician Assistant

## 2021-12-26 DIAGNOSIS — E7800 Pure hypercholesterolemia, unspecified: Secondary | ICD-10-CM

## 2022-02-04 ENCOUNTER — Other Ambulatory Visit: Payer: Self-pay | Admitting: Physician Assistant

## 2022-02-04 DIAGNOSIS — F331 Major depressive disorder, recurrent, moderate: Secondary | ICD-10-CM

## 2022-02-04 NOTE — Telephone Encounter (Signed)
[facility] faxed refill request for the following medications:  buPROPion (WELLBUTRIN XL) 150 MG 24 hr tablet   Please advise.

## 2022-02-05 MED ORDER — BUPROPION HCL ER (XL) 150 MG PO TB24: ORAL_TABLET | ORAL | 1 refills | Status: DC

## 2022-02-05 NOTE — Addendum Note (Signed)
Addended by: Althea Charon D on: 02/05/2022 10:27 AM   Modules accepted: Orders

## 2022-02-07 ENCOUNTER — Encounter: Payer: Self-pay | Admitting: Physician Assistant

## 2022-02-07 ENCOUNTER — Ambulatory Visit (INDEPENDENT_AMBULATORY_CARE_PROVIDER_SITE_OTHER): Payer: Medicare Other | Admitting: Physician Assistant

## 2022-02-07 VITALS — BP 114/72 | HR 71 | Temp 98.6°F | Resp 16 | Wt 151.0 lb

## 2022-02-07 DIAGNOSIS — B309 Viral conjunctivitis, unspecified: Secondary | ICD-10-CM | POA: Diagnosis not present

## 2022-02-07 MED ORDER — AZELASTINE HCL 0.05 % OP SOLN: 1.0000 [drp] | Freq: Two times a day (BID) | OPHTHALMIC | 12 refills | Status: DC

## 2022-02-07 MED ORDER — KETOTIFEN FUMARATE 0.025 % OP SOLN
1.0000 [drp] | Freq: Two times a day (BID) | OPHTHALMIC | 0 refills | Status: DC
Start: 2022-02-07 — End: 2022-07-26

## 2022-02-07 MED ORDER — ERYTHROMYCIN 5 MG/GM OP OINT
1.0000 | TOPICAL_OINTMENT | Freq: Every day | OPHTHALMIC | 0 refills | Status: DC
Start: 2022-02-07 — End: 2022-07-26

## 2022-02-07 NOTE — Progress Notes (Signed)
I,[name],acting as a Education administrator for [employer], PA-C.,have documented all relevant documentation on the behalf of Diana Speak, PA-C,as directed by  [employer], PA-C while in the presence of [employer], PA-C.    Established patient visit   Patient: Diana Blanchard   DOB: June 07, 1946   76 y.o. Female  MRN: [id number] Visit Date: 02/07/2022  Today's healthcare provider: [name], PA-C   Chief Complaint  Patient presents with   Eye Problem   Subjective    Patient presents for left eye redness, watery and irritation. This morning felt somewhat dyed shut. Onset yesterday. Applied cold compresses last night. No exposure to pink eye to her knowledge and no hx.     Medications: Outpatient Medications Prior to Visit  Medication Sig   buPROPion (WELLBUTRIN XL) 150 MG 24 hr tablet TAKE 1 TABLET(150 MG) BY MOUTH DAILY   escitalopram (LEXAPRO) 20 MG tablet TAKE 1 TABLET BY MOUTH EVERY DAY   Multiple Vitamins-Minerals (WOMENS 50+ MULTI VITAMIN/MIN) TABS Take by mouth daily.   simvastatin (ZOCOR) 40 MG tablet TAKE 1 TABLET BY MOUTH DAILY   valACYclovir (VALTREX) 500 MG tablet TAKE 1 TABLET(500 MG) BY MOUTH TWICE DAILY AS NEEDED   No facility-administered medications prior to visit.    Review of Systems  All other systems reviewed and are negative.  Except see HPI    Objective    BP 114/72 (BP Location: Right Arm, Patient Position: Sitting, Cuff Size: Normal)   Pulse 71   Temp 98.6 F (37 C) (Oral)   Resp 16   Wt 151 lb (68.5 kg)   SpO2 99%   BMI 24.37 kg/m    Physical Exam Vitals reviewed.  Constitutional:      General: She is not in acute distress.    Appearance: Normal appearance. She is well-developed. She is not diaphoretic.  HENT:     Head: Normocephalic and atraumatic.  Eyes:     General: No scleral icterus.       Right eye: Discharge present.        Left eye: Discharge present.    Extraocular Movements: Extraocular movements intact.      Conjunctiva/sclera: Conjunctivae normal.     Pupils: Pupils are equal, round, and reactive to light.     Comments: Injected conjunctiva bilaterally, L>R Slight redness of the left upper lid  Neck:     Thyroid: No thyromegaly.  Cardiovascular:     Rate and Rhythm: Normal rate and regular rhythm.     Pulses: Normal pulses.     Heart sounds: Normal heart sounds. No murmur heard. Pulmonary:     Effort: Pulmonary effort is normal. No respiratory distress.     Breath sounds: Normal breath sounds. No wheezing, rhonchi or rales.  Musculoskeletal:     Cervical back: Neck supple.     Right lower leg: No edema.     Left lower leg: No edema.  Lymphadenopathy:     Cervical: No cervical adenopathy.  Skin:    General: Skin is warm and dry.     Findings: No rash.  Neurological:     Mental Status: She is alert and oriented to person, place, and time. Mental status is at baseline.  Psychiatric:        Mood and Affect: Mood normal.        Behavior: Behavior normal.     No results found for any visits on 02/07/22.  Assessment & Plan     1. Acute viral conjunctivitis  of left eye - azelastine (OPTIVAR) 0.05 % ophthalmic solution; Place 1 drop into both eyes 2 (two) times daily.  Dispense: 6 mL; Refill: 12 - erythromycin ophthalmic ointment; Place 1 Application into both eyes at bedtime.  Dispense: 3.5 g; Refill: 0 if antihistamine drops will not provide any relief. OR- ketotifen (ALAWAY) 0.025 % ophthalmic solution; Place 1 drop into both eyes 2 (two) times daily.  Dispense: 5 mL; Refill: 0 If symptoms worsen - redness, swelling around the eye,  advised to call back  If pain with ocular movements, proceed to ER  FU PRN     The patient was advised to call back or seek an in-person evaluation if the symptoms worsen or if the condition fails to improve as anticipated.  I discussed the assessment and treatment plan with the patient. The patient was provided an opportunity to ask questions and all  were answered. The patient agreed with the plan and demonstrated an understanding of the instructions.  The entirety of the information documented in the History of Present Illness, Review of Systems and Physical Exam were personally obtained by me. Portions of this information were initially documented by the CMA and reviewed by me for thoroughness and accuracy.  Portions of this note were created using dictation software and may contain typographical errors.     Diana Speak, PA-C  [facility] [phone] (phone) [phone] (fax)  [place]

## 2022-02-13 ENCOUNTER — Encounter: Payer: Self-pay | Admitting: Physician Assistant

## 2022-04-15 ENCOUNTER — Encounter (INDEPENDENT_AMBULATORY_CARE_PROVIDER_SITE_OTHER): Payer: Self-pay

## 2022-04-25 DIAGNOSIS — H2513 Age-related nuclear cataract, bilateral: Secondary | ICD-10-CM | POA: Diagnosis not present

## 2022-05-02 ENCOUNTER — Encounter: Payer: Self-pay | Admitting: Ophthalmology

## 2022-05-02 DIAGNOSIS — M542 Cervicalgia: Secondary | ICD-10-CM | POA: Diagnosis not present

## 2022-05-02 DIAGNOSIS — M5030 Other cervical disc degeneration, unspecified cervical region: Secondary | ICD-10-CM | POA: Diagnosis not present

## 2022-05-02 DIAGNOSIS — M25312 Other instability, left shoulder: Secondary | ICD-10-CM | POA: Diagnosis not present

## 2022-05-02 DIAGNOSIS — S46002A Unspecified injury of muscle(s) and tendon(s) of the rotator cuff of left shoulder, initial encounter: Secondary | ICD-10-CM | POA: Diagnosis not present

## 2022-05-02 DIAGNOSIS — M25512 Pain in left shoulder: Secondary | ICD-10-CM | POA: Diagnosis not present

## 2022-05-03 ENCOUNTER — Other Ambulatory Visit: Payer: Self-pay | Admitting: Orthopedic Surgery

## 2022-05-03 DIAGNOSIS — S46002A Unspecified injury of muscle(s) and tendon(s) of the rotator cuff of left shoulder, initial encounter: Secondary | ICD-10-CM

## 2022-05-03 DIAGNOSIS — G8929 Other chronic pain: Secondary | ICD-10-CM

## 2022-05-06 DIAGNOSIS — H2512 Age-related nuclear cataract, left eye: Secondary | ICD-10-CM | POA: Diagnosis not present

## 2022-05-07 NOTE — Discharge Instructions (Signed)

## 2022-05-13 ENCOUNTER — Ambulatory Visit: Payer: Medicare Other | Admitting: Anesthesiology

## 2022-05-13 ENCOUNTER — Other Ambulatory Visit: Payer: Self-pay

## 2022-05-13 ENCOUNTER — Encounter: Payer: Self-pay | Admitting: Ophthalmology

## 2022-05-13 ENCOUNTER — Ambulatory Visit
Admission: RE | Admit: 2022-05-13 | Discharge: 2022-05-13 | Disposition: A | Discharge status: Home / Self Care | Payer: Medicare Other | Attending: Ophthalmology | Admitting: Ophthalmology

## 2022-05-13 ENCOUNTER — Encounter
Admission: RE | Disposition: A | Discharge status: Home / Self Care | Payer: Self-pay | Source: Home / Self Care | Attending: Ophthalmology

## 2022-05-13 DIAGNOSIS — F32A Depression, unspecified: Secondary | ICD-10-CM | POA: Diagnosis not present

## 2022-05-13 DIAGNOSIS — K219 Gastro-esophageal reflux disease without esophagitis: Secondary | ICD-10-CM | POA: Insufficient documentation

## 2022-05-13 DIAGNOSIS — H2512 Age-related nuclear cataract, left eye: Secondary | ICD-10-CM | POA: Diagnosis not present

## 2022-05-13 DIAGNOSIS — I10 Essential (primary) hypertension: Secondary | ICD-10-CM | POA: Diagnosis not present

## 2022-05-13 DIAGNOSIS — F419 Anxiety disorder, unspecified: Secondary | ICD-10-CM | POA: Diagnosis not present

## 2022-05-13 DIAGNOSIS — Z87891 Personal history of nicotine dependence: Secondary | ICD-10-CM | POA: Insufficient documentation

## 2022-05-13 DIAGNOSIS — Z79899 Other long term (current) drug therapy: Secondary | ICD-10-CM | POA: Diagnosis not present

## 2022-05-13 HISTORY — PX: CATARACT EXTRACTION W/PHACO: SHX586

## 2022-05-13 SURGERY — PHACOEMULSIFICATION, CATARACT, WITH IOL INSERTION
Anesthesia: Monitor Anesthesia Care | Site: Eye | Laterality: Left

## 2022-05-13 MED ORDER — SIGHTPATH DOSE#1 BSS IO SOLN
INTRAOCULAR | Status: DC | PRN
  Administered 2022-05-13: 79 mL via OPHTHALMIC

## 2022-05-13 MED ORDER — FENTANYL CITRATE (PF) 100 MCG/2ML IJ SOLN
INTRAMUSCULAR | Status: DC | PRN
  Administered 2022-05-13: 50 ug via INTRAVENOUS

## 2022-05-13 MED ORDER — FENTANYL CITRATE PF 50 MCG/ML IJ SOSY: 25.0000 ug | PREFILLED_SYRINGE | INTRAMUSCULAR | Status: DC | PRN

## 2022-05-13 MED ORDER — SIGHTPATH DOSE#1 BSS IO SOLN
INTRAOCULAR | Status: DC | PRN
  Administered 2022-05-13: 15 mL

## 2022-05-13 MED ORDER — MIDAZOLAM HCL 2 MG/2ML IJ SOLN
INTRAMUSCULAR | Status: DC | PRN
  Administered 2022-05-13: 1 mg via INTRAVENOUS

## 2022-05-13 MED ORDER — LIDOCAINE HCL (PF) 2 % IJ SOLN
INTRAOCULAR | Status: DC | PRN
  Administered 2022-05-13: 1 mL via INTRAOCULAR

## 2022-05-13 MED ORDER — TETRACAINE HCL 0.5 % OP SOLN
1.0000 [drp] | OPHTHALMIC | Status: DC | PRN
  Administered 2022-05-13 (×3): 1 [drp] via OPHTHALMIC

## 2022-05-13 MED ORDER — ARMC OPHTHALMIC DILATING DROPS
1.0000 | OPHTHALMIC | Status: DC | PRN
  Administered 2022-05-13 (×3): 1 via OPHTHALMIC

## 2022-05-13 MED ORDER — ONDANSETRON HCL 4 MG/2ML IJ SOLN: 4.0000 mg | Freq: Once | INTRAMUSCULAR | Status: DC | PRN

## 2022-05-13 MED ORDER — SIGHTPATH DOSE#1 NA HYALUR & NA CHOND-NA HYALUR IO KIT
PACK | INTRAOCULAR | Status: DC | PRN
  Administered 2022-05-13: 1 via OPHTHALMIC

## 2022-05-13 MED ORDER — MOXIFLOXACIN HCL 0.5 % OP SOLN
OPHTHALMIC | Status: DC | PRN
  Administered 2022-05-13: .2 mL via OPHTHALMIC

## 2022-05-13 MED ORDER — LACTATED RINGERS IV SOLN: INTRAVENOUS | Status: DC

## 2022-05-13 SURGICAL SUPPLY — 19 items
CANNULA ANT/CHMB 27G (MISCELLANEOUS) IMPLANT
CANNULA ANT/CHMB 27GA (MISCELLANEOUS) IMPLANT
CATARACT SUITE SIGHTPATH (MISCELLANEOUS) ×1 IMPLANT
DISSECTOR HYDRO NUCLEUS 50X22 (MISCELLANEOUS) ×1 IMPLANT
FEE CATARACT SUITE SIGHTPATH (MISCELLANEOUS) ×1 IMPLANT
GLOVE SURG GAMMEX PI TX LF 7.5 (GLOVE) ×1 IMPLANT
GLOVE SURG SYN 8.5  E (GLOVE) ×1
GLOVE SURG SYN 8.5 E (GLOVE) ×1 IMPLANT
GLOVE SURG SYN 8.5 PF PI (GLOVE) ×1 IMPLANT
LENS IOL TECNIS EYHANCE 27.5 (Intraocular Lens) IMPLANT
NDL FILTER BLUNT 18X1 1/2 (NEEDLE) ×1 IMPLANT
NEEDLE FILTER BLUNT 18X1 1/2 (NEEDLE) ×1 IMPLANT
PACK VIT ANT 23G (MISCELLANEOUS) IMPLANT
RING MALYGIN (MISCELLANEOUS) IMPLANT
SUT ETHILON 10-0 CS-B-6CS-B-6 (SUTURE)
SUTURE EHLN 10-0 CS-B-6CS-B-6 (SUTURE) IMPLANT
SYR 3ML LL SCALE MARK (SYRINGE) ×1 IMPLANT
SYR 5ML LL (SYRINGE) ×1 IMPLANT
WATER STERILE IRR 250ML POUR (IV SOLUTION) ×1 IMPLANT

## 2022-05-13 NOTE — Anesthesia Preprocedure Evaluation (Signed)
Anesthesia Evaluation  Patient identified by MRN, date of birth, ID band Patient awake    Reviewed: Allergy & Precautions, H&P , NPO status , Patient's Chart, lab work & pertinent test results, reviewed documented beta blocker date and time   Airway Mallampati: II  TM Distance: >3 FB Neck ROM: full    Dental no notable dental hx. (+) Teeth Intact   Pulmonary neg pulmonary ROS, former smoker   Pulmonary exam normal breath sounds clear to auscultation       Cardiovascular Exercise Tolerance: Good hypertension, On Medications negative cardio ROS  Rhythm:regular Rate:Normal     Neuro/Psych  PSYCHIATRIC DISORDERS Anxiety Depression    negative neurological ROS     GI/Hepatic Neg liver ROS,GERD  Medicated,,  Endo/Other  negative endocrine ROSdiabetes, Well Controlled    Renal/GU      Musculoskeletal   Abdominal   Peds  Hematology negative hematology ROS (+)   Anesthesia Other Findings   Reproductive/Obstetrics negative OB ROS                             Anesthesia Physical Anesthesia Plan  ASA: 2  Anesthesia Plan: MAC   Post-op Pain Management:    Induction:   PONV Risk Score and Plan:   Airway Management Planned:   Additional Equipment:   Intra-op Plan:   Post-operative Plan:   Informed Consent: I have reviewed the patients History and Physical, chart, labs and discussed the procedure including the risks, benefits and alternatives for the proposed anesthesia with the patient or authorized representative who has indicated his/her understanding and acceptance.       Plan Discussed with: CRNA  Anesthesia Plan Comments:        Anesthesia Quick Evaluation

## 2022-05-13 NOTE — H&P (Signed)
[facility]   Primary Care Physician:  [name], PA-C Ophthalmologist: Dr. Benay Pillow  Pre-Procedure History & Physical: HPI:  Diana Blanchard is a 76 y.o. female here for cataract surgery.   Past Medical History:  Diagnosis Date   Anxiety    Arthritis    fingers   Billowing mitral valve    Depression    Dysplastic nevus 05/20/2006   R lat buttock - moderate   Dysplastic nevus 12/31/2006   R back braline - moderate, excised 04/07/2007   Dysplastic nevus 10/06/2008   R back paraspinal med to inf scapula - mild to mod   GERD (gastroesophageal reflux disease)    Hypercholesterolemia    Mitral valve prolapse    Varicose vein of leg     Past Surgical History:  Procedure Laterality Date   bowel obstruction  1990   BREAST CYST ASPIRATION Right 1980's   COLONOSCOPY WITH PROPOFOL N/A 06/29/2015   Procedure: COLONOSCOPY WITH PROPOFOL;  Surgeon: Manya Silvas, MD;  Location: [facility] ENDOSCOPY;  Service: Endoscopy;  Laterality: N/A;   COLONOSCOPY WITH PROPOFOL N/A 07/27/2020   Procedure: COLONOSCOPY WITH PROPOFOL;  Surgeon: Lucilla Lame, MD;  Location: [place];  Service: Endoscopy;  Laterality: N/A;  priority 4   cyst removed  1981   on the ovary   LAPAROSCOPY  0623   complications from IUD   POLYPECTOMY N/A 07/27/2020   Procedure: POLYPECTOMY;  Surgeon: Lucilla Lame, MD;  Location: [place];  Service: Endoscopy;  Laterality: N/A;   ROTATOR CUFF REPAIR Right 06/2011   TONSILLECTOMY AND ADENOIDECTOMY  1953    Prior to Admission medications   Medication Sig Start Date End Date Taking? Authorizing Provider  buPROPion (WELLBUTRIN XL) 150 MG 24 hr tablet TAKE 1 TABLET(150 MG) BY MOUTH DAILY 02/05/22  Yes Drubel, Ria Comment, PA-C  escitalopram (LEXAPRO) 20 MG tablet TAKE 1 TABLET BY MOUTH EVERY DAY 12/17/21  Yes Drubel, Ria Comment, PA-C  Multiple Vitamins-Minerals (WOMENS 50+ MULTI VITAMIN/MIN) TABS Take by mouth daily.   Yes [provider]   simvastatin (ZOCOR) 40 MG tablet TAKE 1 TABLET BY MOUTH DAILY 12/26/21  Yes Drubel, Ria Comment, PA-C  valACYclovir (VALTREX) 500 MG tablet TAKE 1 TABLET(500 MG) BY MOUTH TWICE DAILY AS NEEDED 10/24/21  Yes Drubel, Ria Comment, PA-C  azelastine (OPTIVAR) 0.05 % ophthalmic solution Place 1 drop into both eyes 2 (two) times daily. Patient not taking: Reported on 05/02/2022 02/07/22   [name], PA-C  erythromycin ophthalmic ointment Place 1 Application into both eyes at bedtime. Patient not taking: Reported on 05/02/2022 02/07/22   [name], PA-C  ketotifen (ALAWAY) 0.025 % ophthalmic solution Place 1 drop into both eyes 2 (two) times daily. Patient not taking: Reported on 05/02/2022 02/07/22   [name], PA-C    Allergies as of 04/30/2022 - Review Complete 02/07/2022  Allergen Reaction Noted   Latex Itching 06/28/2015   Neosporin [neomycin-bacitracin zn-polymyx]  06/28/2015   Prednisone Swelling 07/06/2019   Sulfa antibiotics Other (See Comments) 06/28/2015   Benzalkonium chloride Itching and Rash 05/23/2016    Family History  Problem Relation Age of Onset   Arthritis Mother    Hyperlipidemia Mother    Hypertension Mother    Alzheimer's disease Mother    Diabetes Father    CVA Father    HIV Brother    Breast cancer Neg Hx     Social History   Socioeconomic History   Marital status: Married    Spouse name: Merry Proud   Number  of children: 1   Years of education: business college   Highest education level: Associate degree: occupational, Hotel manager, or vocational program  Occupational History   Occupation: Retired  Tobacco Use   Smoking status: Former    Types: Cigarettes    Quit date: 06/16/1969    Years since quitting: 52.9   Smokeless tobacco: Never  Vaping Use   Vaping Use: Never used  Substance and Sexual Activity   Alcohol use: Yes    Alcohol/week: 4.0 - 6.0 standard drinks of alcohol    Types: 4 - 6 Glasses of wine per week   Drug use: No   Sexual activity: Not  on file  Other Topics Concern   Not on file  Social History Narrative   Not on file   Social Determinants of Health   Financial Resource Strain: Low Risk  (07/16/2021)   Overall Financial Resource Strain (CARDIA)    Difficulty of Paying Living Expenses: Not hard at all  Food Insecurity: No Food Insecurity (07/16/2021)   Hunger Vital Sign    Worried About Running Out of Food in the Last Year: Never true    Ran Out of Food in the Last Year: Never true  Transportation Needs: No Transportation Needs (07/16/2021)   PRAPARE - Hydrologist (Medical): No    Lack of Transportation (Non-Medical): No  Physical Activity: Insufficiently Active (07/16/2021)   Exercise Vital Sign    Days of Exercise per Week: 2 days    Minutes of Exercise per Session: 20 min  Stress: Stress Concern Present (07/16/2021)   [place]    Feeling of Stress : Rather much  Social Connections: Moderately Integrated (07/16/2021)   Social Connection and Isolation Panel [NHANES]    Frequency of Communication with Friends and Family: More than three times a week    Frequency of Social Gatherings with Friends and Family: More than three times a week    Attends Religious Services: More than 4 times per year    Active Member of [employer] or Organizations: No    Attends Archivist Meetings: Never    Marital Status: Married  Human resources officer Violence: Not At Risk (07/16/2021)   Humiliation, Afraid, Rape, and Kick questionnaire    Fear of Current or Ex-Partner: No    Emotionally Abused: No    Physically Abused: No    Sexually Abused: No    Review of Systems: See HPI, otherwise negative ROS  Physical Exam: BP (!) 146/87   Temp (!) 97.2 F (36.2 C) (Tympanic)   Ht 5' 5.98" (1.676 m)   Wt 70.4 kg   SpO2 98%   BMI 25.06 kg/m  General:   Alert, cooperative in NAD Head:  Normocephalic and atraumatic. Respiratory:  Normal  work of breathing. Cardiovascular:  RRR  Impression/Plan: Diana Blanchard is here for cataract surgery.  Risks, benefits, limitations, and alternatives regarding cataract surgery have been reviewed with the patient.  Questions have been answered.  All parties agreeable.   Benay Pillow, MD  05/13/2022, 11:54 AM

## 2022-05-13 NOTE — Op Note (Signed)
OPERATIVE NOTE  Diana Blanchard [id number] 05/13/2022   PREOPERATIVE DIAGNOSIS:  Nuclear sclerotic cataract left eye.  H25.12   POSTOPERATIVE DIAGNOSIS:    Nuclear sclerotic cataract left eye.     PROCEDURE:  Phacoemusification with posterior chamber intraocular lens placement of the left eye   LENS:   Implant Name Type Inv. Item Serial No. Manufacturer Lot No. LRB No. Used Action  LENS IOL TECNIS EYHANCE 27.5 - [id number] Intraocular Lens LENS IOL TECNIS EYHANCE 27.5 [id number] [employer]  Left 1 Implanted      Procedure(s) with comments: CATARACT EXTRACTION PHACO AND INTRAOCULAR LENS PLACEMENT (IOC) LEFT (Left) - 3.38 10:29.1  DIB00 +27.5   ULTRASOUND TIME: 0 minutes 29 seconds.  CDE 3.38   SURGEON:  Benay Pillow, MD, MPH   ANESTHESIA:  Topical with tetracaine drops augmented with 1% preservative-free intracameral lidocaine.  ESTIMATED BLOOD LOSS: <1 mL   COMPLICATIONS:  None.   DESCRIPTION OF PROCEDURE:  The patient was identified in the holding room and transported to the operating room and placed in the supine position under the operating microscope.  The left eye was identified as the operative eye and it was prepped and draped in the usual sterile ophthalmic fashion.   A 1.0 millimeter clear-corneal paracentesis was made at the 5:00 position. 0.5 ml of preservative-free 1% lidocaine with epinephrine was injected into the anterior chamber.  The anterior chamber was filled with viscoelastic.  A 2.4 millimeter keratome was used to make a near-clear corneal incision at the 2:00 position.  A curvilinear capsulorrhexis was made with a cystotome and capsulorrhexis forceps.  Balanced salt solution was used to hydrodissect and hydrodelineate the nucleus.   Phacoemulsification was then used in stop and chop fashion to remove the lens nucleus and epinucleus.  The remaining cortex was then removed using the irrigation and aspiration handpiece. Viscoelastic was then placed into the  capsular bag to distend it for lens placement.  A lens was then injected into the capsular bag.  The remaining viscoelastic was aspirated.   Wounds were hydrated with balanced salt solution.  The anterior chamber was inflated to a physiologic pressure with balanced salt solution.  Intracameral vigamox 0.1 mL undiltued was injected into the eye and a drop placed onto the ocular surface.  No wound leaks were noted.  The patient was taken to the recovery room in stable condition without complications of anesthesia or surgery  Benay Pillow 05/13/2022, 12:20 PM

## 2022-05-13 NOTE — Anesthesia Postprocedure Evaluation (Signed)
Anesthesia Post Note  Patient: Kimla Furth  Procedure(s) Performed: CATARACT EXTRACTION PHACO AND INTRAOCULAR LENS PLACEMENT (I[facility]) LEFT (Left: Eye)  Patient location during evaluation: PACU Anesthesia Type: MAC Level of consciousness: awake and alert Pain management: pain level controlled Vital Signs Assessment: post-procedure vital signs reviewed and stable Respiratory status: spontaneous breathing, nonlabored ventilation, respiratory function stable and patient connected to nasal cannula oxygen Cardiovascular status: stable and blood pressure returned to baseline Postop Assessment: no apparent nausea or vomiting Anesthetic complications: no   No notable events documented.   Last Vitals:  Vitals:   05/13/22 1226 05/13/22 1228  BP: 124/80   Pulse: (!) 54 (!) 57  Resp: 13 13  Temp:    SpO2: 93% 94%    Last Pain:  Vitals:   05/13/22 1226  TempSrc:   PainSc: 0-No pain                 Molli Barrows

## 2022-05-13 NOTE — Transfer of Care (Signed)
Immediate Anesthesia Transfer of Care Note  Patient: Diana Blanchard  Procedure(s) Performed: CATARACT EXTRACTION PHACO AND INTRAOCULAR LENS PLACEMENT (IOC) LEFT (Left: Eye)  Patient Location: PACU  Anesthesia Type: MAC  Level of Consciousness: awake, alert  and patient cooperative  Airway and Oxygen Therapy: Patient Spontanous Breathing and Patient connected to supplemental oxygen  Post-op Assessment: Post-op Vital signs reviewed, Patient's Cardiovascular Status Stable, Respiratory Function Stable, Patent Airway and No signs of Nausea or vomiting  Post-op Vital Signs: Reviewed and stable  Complications: No notable events documented.

## 2022-05-14 ENCOUNTER — Encounter: Payer: Self-pay | Admitting: Ophthalmology

## 2022-05-22 ENCOUNTER — Ambulatory Visit
Admission: RE | Admit: 2022-05-22 | Discharge: 2022-05-22 | Disposition: A | Discharge status: Home / Self Care | Payer: Medicare Other | Source: Ambulatory Visit | Attending: Orthopedic Surgery | Admitting: Orthopedic Surgery

## 2022-05-22 ENCOUNTER — Encounter: Payer: Self-pay | Admitting: Ophthalmology

## 2022-05-22 DIAGNOSIS — S46002A Unspecified injury of muscle(s) and tendon(s) of the rotator cuff of left shoulder, initial encounter: Secondary | ICD-10-CM

## 2022-05-22 DIAGNOSIS — G8929 Other chronic pain: Secondary | ICD-10-CM

## 2022-05-22 DIAGNOSIS — S46012A Strain of muscle(s) and tendon(s) of the rotator cuff of left shoulder, initial encounter: Secondary | ICD-10-CM | POA: Diagnosis not present

## 2022-05-22 DIAGNOSIS — H2511 Age-related nuclear cataract, right eye: Secondary | ICD-10-CM | POA: Diagnosis not present

## 2022-05-23 NOTE — Discharge Instructions (Signed)

## 2022-05-27 ENCOUNTER — Ambulatory Visit: Payer: Medicare Other | Admitting: Anesthesiology

## 2022-05-27 ENCOUNTER — Encounter: Payer: Self-pay | Admitting: Ophthalmology

## 2022-05-27 ENCOUNTER — Other Ambulatory Visit: Payer: Self-pay

## 2022-05-27 ENCOUNTER — Ambulatory Visit
Admission: RE | Admit: 2022-05-27 | Discharge: 2022-05-27 | Disposition: A | Discharge status: Home / Self Care | Payer: Medicare Other | Attending: Ophthalmology | Admitting: Ophthalmology

## 2022-05-27 ENCOUNTER — Encounter
Admission: RE | Disposition: A | Discharge status: Home / Self Care | Payer: Self-pay | Source: Home / Self Care | Attending: Ophthalmology

## 2022-05-27 DIAGNOSIS — E7800 Pure hypercholesterolemia, unspecified: Secondary | ICD-10-CM | POA: Diagnosis not present

## 2022-05-27 DIAGNOSIS — H2511 Age-related nuclear cataract, right eye: Secondary | ICD-10-CM | POA: Insufficient documentation

## 2022-05-27 DIAGNOSIS — Z87891 Personal history of nicotine dependence: Secondary | ICD-10-CM | POA: Diagnosis not present

## 2022-05-27 DIAGNOSIS — I10 Essential (primary) hypertension: Secondary | ICD-10-CM | POA: Diagnosis not present

## 2022-05-27 HISTORY — PX: CATARACT EXTRACTION W/PHACO: SHX586

## 2022-05-27 SURGERY — PHACOEMULSIFICATION, CATARACT, WITH IOL INSERTION
Anesthesia: Monitor Anesthesia Care | Site: Eye | Laterality: Right

## 2022-05-27 MED ORDER — SIGHTPATH DOSE#1 BSS IO SOLN
INTRAOCULAR | Status: DC | PRN
  Administered 2022-05-27: 83 mL via OPHTHALMIC

## 2022-05-27 MED ORDER — ARMC OPHTHALMIC DILATING DROPS
1.0000 | OPHTHALMIC | Status: DC | PRN
  Administered 2022-05-27 (×3): 1 via OPHTHALMIC

## 2022-05-27 MED ORDER — MOXIFLOXACIN HCL 0.5 % OP SOLN
OPHTHALMIC | Status: DC | PRN
  Administered 2022-05-27: .2 mL via OPHTHALMIC

## 2022-05-27 MED ORDER — SODIUM CHLORIDE 0.9% FLUSH
INTRAVENOUS | Status: DC | PRN
  Administered 2022-05-27: 10 mL via INTRAVENOUS

## 2022-05-27 MED ORDER — FENTANYL CITRATE (PF) 100 MCG/2ML IJ SOLN
INTRAMUSCULAR | Status: DC | PRN
  Administered 2022-05-27: 75 ug via INTRAVENOUS

## 2022-05-27 MED ORDER — LIDOCAINE HCL (PF) 2 % IJ SOLN
INTRAOCULAR | Status: DC | PRN
  Administered 2022-05-27: 1 mL via INTRAOCULAR

## 2022-05-27 MED ORDER — TETRACAINE HCL 0.5 % OP SOLN
1.0000 [drp] | OPHTHALMIC | Status: DC | PRN
  Administered 2022-05-27 (×3): 1 [drp] via OPHTHALMIC

## 2022-05-27 MED ORDER — SIGHTPATH DOSE#1 NA HYALUR & NA CHOND-NA HYALUR IO KIT
PACK | INTRAOCULAR | Status: DC | PRN
  Administered 2022-05-27: 1 via OPHTHALMIC

## 2022-05-27 MED ORDER — SIGHTPATH DOSE#1 BSS IO SOLN
INTRAOCULAR | Status: DC | PRN
  Administered 2022-05-27: 15 mL

## 2022-05-27 MED ORDER — MIDAZOLAM HCL 2 MG/2ML IJ SOLN
INTRAMUSCULAR | Status: DC | PRN
  Administered 2022-05-27: .5 mg via INTRAVENOUS

## 2022-05-27 SURGICAL SUPPLY — 10 items
CATARACT SUITE SIGHTPATH (MISCELLANEOUS) ×1 IMPLANT
DISSECTOR HYDRO NUCLEUS 50X22 (MISCELLANEOUS) ×1 IMPLANT
FEE CATARACT SUITE SIGHTPATH (MISCELLANEOUS) ×1 IMPLANT
GLOVE SURG GAMMEX PI TX LF 7.5 (GLOVE) ×1 IMPLANT
LENS IOL TECNIS EYHANCE 23.0 (Intraocular Lens) IMPLANT
NDL FILTER BLUNT 18X1 1/2 (NEEDLE) ×1 IMPLANT
NEEDLE FILTER BLUNT 18X1 1/2 (NEEDLE) ×1 IMPLANT
SYR 3ML LL SCALE MARK (SYRINGE) ×1 IMPLANT
SYR 5ML LL (SYRINGE) ×1 IMPLANT
WATER STERILE IRR 250ML POUR (IV SOLUTION) ×1 IMPLANT

## 2022-05-27 NOTE — Anesthesia Preprocedure Evaluation (Signed)
Anesthesia Evaluation  Patient identified by MRN, date of birth, ID band Patient awake    Reviewed: Allergy & Precautions, H&P , NPO status , Patient's Chart, lab work & pertinent test results, reviewed documented beta blocker date and time   Airway Mallampati: II  TM Distance: >3 FB Neck ROM: full    Dental no notable dental hx. (+) Teeth Intact   Pulmonary neg pulmonary ROS, former smoker   Pulmonary exam normal breath sounds clear to auscultation       Cardiovascular Exercise Tolerance: Good hypertension, On Medications negative cardio ROS  Rhythm:regular Rate:Normal     Neuro/Psych  PSYCHIATRIC DISORDERS Anxiety Depression    negative neurological ROS     GI/Hepatic Neg liver ROS,GERD  Medicated,,  Endo/Other  negative endocrine ROSdiabetes, Well Controlled    Renal/GU      Musculoskeletal   Abdominal   Peds  Hematology negative hematology ROS (+)   Anesthesia Other Findings   Reproductive/Obstetrics negative OB ROS                              Anesthesia Physical Anesthesia Plan  ASA: 2  Anesthesia Plan: MAC   Post-op Pain Management:    Induction: Intravenous  PONV Risk Score and Plan:   Airway Management Planned: Natural Airway and Nasal Cannula  Additional Equipment:   Intra-op Plan:   Post-operative Plan:   Informed Consent: I have reviewed the patients History and Physical, chart, labs and discussed the procedure including the risks, benefits and alternatives for the proposed anesthesia with the patient or authorized representative who has indicated his/her understanding and acceptance.     Dental Advisory Given  Plan Discussed with: CRNA  Anesthesia Plan Comments: (Patient consented for risks of anesthesia including but not limited to:  - adverse reactions to medications - damage to eyes, teeth, lips or other oral mucosa - nerve damage due to positioning   - sore throat or hoarseness - Damage to heart, brain, nerves, lungs, other parts of body or loss of life  Patient voiced understanding.)        Anesthesia Quick Evaluation

## 2022-05-27 NOTE — Anesthesia Postprocedure Evaluation (Signed)
Anesthesia Post Note  Patient: Yeraldin Litzenberger  Procedure(s) Performed: CATARACT EXTRACTION PHACO AND INTRAOCULAR LENS PLACEMENT (IOC) RIGHT  2.60  00:26.7 (Right: Eye)  Patient location during evaluation: PACU Anesthesia Type: MAC Level of consciousness: awake and alert Pain management: pain level controlled Vital Signs Assessment: post-procedure vital signs reviewed and stable Respiratory status: spontaneous breathing, nonlabored ventilation, respiratory function stable and patient connected to nasal cannula oxygen Cardiovascular status: stable and blood pressure returned to baseline Postop Assessment: no apparent nausea or vomiting Anesthetic complications: no   No notable events documented.   Last Vitals:  Vitals:   05/27/22 1022 05/27/22 1026  BP: 125/81 121/79  Pulse: (!) 58 (!) 54  Resp: 14 12  Temp: (!) 36.2 C (!) 36.2 C  SpO2: 95% 94%    Last Pain:  Vitals:   05/27/22 1026  TempSrc:   PainSc: 0-No pain                 Ilene Qua

## 2022-05-27 NOTE — Transfer of Care (Signed)
Immediate Anesthesia Transfer of Care Note  Patient: Diana Blanchard  Procedure(s) Performed: CATARACT EXTRACTION PHACO AND INTRAOCULAR LENS PLACEMENT (IOC) RIGHT  2.60  00:26.7 (Right: Eye)  Patient Location: PACU  Anesthesia Type: MAC  Level of Consciousness: awake, alert  and patient cooperative  Airway and Oxygen Therapy: Patient Spontanous Breathing and Patient connected to supplemental oxygen  Post-op Assessment: Post-op Vital signs reviewed, Patient's Cardiovascular Status Stable, Respiratory Function Stable, Patent Airway and No signs of Nausea or vomiting  Post-op Vital Signs: Reviewed and stable  Complications: No notable events documented.

## 2022-05-27 NOTE — Op Note (Signed)
OPERATIVE NOTE  Chelsie [place] [id number] 05/27/2022   PREOPERATIVE DIAGNOSIS:  Nuclear sclerotic cataract right eye.  H25.11   POSTOPERATIVE DIAGNOSIS:    Nuclear sclerotic cataract right eye.     PROCEDURE:  Phacoemusification with posterior chamber intraocular lens placement of the right eye   LENS:   Implant Name Type Inv. Item Serial No. Manufacturer Lot No. LRB No. Used Action  LENS IOL TECNIS EYHANCE 23.0 - [id number] Intraocular Lens LENS IOL TECNIS EYHANCE 23.0 [id number] [employer]  Right 1 Implanted       Procedure(s): CATARACT EXTRACTION PHACO AND INTRAOCULAR LENS PLACEMENT (IOC) RIGHT  2.60  00:26.7 (Right)  DIB00 +23.0   ULTRASOUND TIME: 0 minutes 26 seconds.  CDE 2.60   SURGEON:  Benay Pillow, MD, MPH  ANESTHESIOLOGIST: Anesthesiologist: Ilene Qua, MD CRNA: Gigi Gin, CRNA; Candice Camp, CRNA   ANESTHESIA:  Topical with tetracaine drops augmented with 1% preservative-free intracameral lidocaine.  ESTIMATED BLOOD LOSS: less than 1 mL.   COMPLICATIONS:  None.   DESCRIPTION OF PROCEDURE:  The patient was identified in the holding room and transported to the operating room and placed in the supine position under the operating microscope.  The right eye was identified as the operative eye and it was prepped and draped in the usual sterile ophthalmic fashion.   A 1.0 millimeter clear-corneal paracentesis was made at the 10:30 position. 0.5 ml of preservative-free 1% lidocaine with epinephrine was injected into the anterior chamber.  The anterior chamber was filled with viscoelastic.  A 2.4 millimeter keratome was used to make a near-clear corneal incision at the 8:00 position.  A curvilinear capsulorrhexis was made with a cystotome and capsulorrhexis forceps.  Balanced salt solution was used to hydrodissect and hydrodelineate the nucleus.   Phacoemulsification was then used in stop and chop fashion to remove the lens nucleus and epinucleus.  The remaining  cortex was then removed using the irrigation and aspiration handpiece. Viscoelastic was then placed into the capsular bag to distend it for lens placement.  A lens was then injected into the capsular bag.  The remaining viscoelastic was aspirated.   Wounds were hydrated with balanced salt solution.  The anterior chamber was inflated to a physiologic pressure with balanced salt solution.   Intracameral vigamox 0.1 mL undiluted was injected into the eye and a drop placed onto the ocular surface.  No wound leaks were noted.  The patient was taken to the recovery room in stable condition without complications of anesthesia or surgery  Benay Pillow 05/27/2022, 10:21 AM

## 2022-05-27 NOTE — H&P (Signed)
[facility]   Primary Care Physician:  [name], PA-C Ophthalmologist: Dr. Benay Pillow  Pre-Procedure History & Physical: HPI:  Torrey Horseman is a 76 y.o. female here for cataract surgery.   Past Medical History:  Diagnosis Date   Anxiety    Arthritis    fingers   Billowing mitral valve    Depression    Dysplastic nevus 05/20/2006   R lat buttock - moderate   Dysplastic nevus 12/31/2006   R back braline - moderate, excised 04/07/2007   Dysplastic nevus 10/06/2008   R back paraspinal med to inf scapula - mild to mod   GERD (gastroesophageal reflux disease)    Hypercholesterolemia    Mitral valve prolapse    Varicose vein of leg     Past Surgical History:  Procedure Laterality Date   bowel obstruction  1990   BREAST CYST ASPIRATION Right 1980's   CATARACT EXTRACTION W/PHACO Left 05/13/2022   Procedure: CATARACT EXTRACTION PHACO AND INTRAOCULAR LENS PLACEMENT ([place]) LEFT;  Surgeon: Eulogio Bear, MD;  Location: [place];  Service: Ophthalmology;  Laterality: Left;  3.38 10:29.1   COLONOSCOPY WITH PROPOFOL N/A 06/29/2015   Procedure: COLONOSCOPY WITH PROPOFOL;  Surgeon: Manya Silvas, MD;  Location: [facility] ENDOSCOPY;  Service: Endoscopy;  Laterality: N/A;   COLONOSCOPY WITH PROPOFOL N/A 07/27/2020   Procedure: COLONOSCOPY WITH PROPOFOL;  Surgeon: Lucilla Lame, MD;  Location: [place];  Service: Endoscopy;  Laterality: N/A;  priority 4   cyst removed  1981   on the ovary   LAPAROSCOPY  9798   complications from IUD   POLYPECTOMY N/A 07/27/2020   Procedure: POLYPECTOMY;  Surgeon: Lucilla Lame, MD;  Location: [place];  Service: Endoscopy;  Laterality: N/A;   ROTATOR CUFF REPAIR Right 06/2011   TONSILLECTOMY AND ADENOIDECTOMY  1953    Prior to Admission medications   Medication Sig Start Date End Date Taking? Authorizing Provider  buPROPion (WELLBUTRIN XL) 150 MG 24 hr tablet TAKE 1 TABLET(150 MG) BY MOUTH DAILY 02/05/22  Yes  Drubel, Ria Comment, PA-C  escitalopram (LEXAPRO) 20 MG tablet TAKE 1 TABLET BY MOUTH EVERY DAY 12/17/21  Yes Drubel, Ria Comment, PA-C  Multiple Vitamins-Minerals (WOMENS 50+ MULTI VITAMIN/MIN) TABS Take by mouth daily.   Yes [provider]  simvastatin (ZOCOR) 40 MG tablet TAKE 1 TABLET BY MOUTH DAILY 12/26/21  Yes Drubel, Ria Comment, PA-C  valACYclovir (VALTREX) 500 MG tablet TAKE 1 TABLET(500 MG) BY MOUTH TWICE DAILY AS NEEDED 10/24/21  Yes Drubel, Ria Comment, PA-C  azelastine (OPTIVAR) 0.05 % ophthalmic solution Place 1 drop into both eyes 2 (two) times daily. Patient not taking: Reported on 05/02/2022 02/07/22   [name], PA-C  erythromycin ophthalmic ointment Place 1 Application into both eyes at bedtime. Patient not taking: Reported on 05/02/2022 02/07/22   [name], PA-C  ketotifen (ALAWAY) 0.025 % ophthalmic solution Place 1 drop into both eyes 2 (two) times daily. Patient not taking: Reported on 05/02/2022 02/07/22   [name], PA-C    Allergies as of 04/30/2022 - Review Complete 02/07/2022  Allergen Reaction Noted   Latex Itching 06/28/2015   Neosporin [neomycin-bacitracin zn-polymyx]  06/28/2015   Prednisone Swelling 07/06/2019   Sulfa antibiotics Other (See Comments) 06/28/2015   Benzalkonium chloride Itching and Rash 05/23/2016    Family History  Problem Relation Age of Onset   Arthritis Mother    Hyperlipidemia Mother    Hypertension Mother    Alzheimer's disease Mother    Diabetes Father  CVA Father    HIV Brother    Breast cancer Neg Hx     Social History   Socioeconomic History   Marital status: Married    Spouse name: Merry Proud   Number of children: 1   Years of education: business college   Highest education level: Associate degree: occupational, Hotel manager, or vocational program  Occupational History   Occupation: Retired  Tobacco Use   Smoking status: Former    Types: Cigarettes    Quit date: 06/16/1969    Years since quitting: 52.9    Smokeless tobacco: Never  Vaping Use   Vaping Use: Never used  Substance and Sexual Activity   Alcohol use: Yes    Alcohol/week: 4.0 - 6.0 standard drinks of alcohol    Types: 4 - 6 Glasses of wine per week   Drug use: No   Sexual activity: Not on file  Other Topics Concern   Not on file  Social History Narrative   Not on file   Social Determinants of Health   Financial Resource Strain: Low Risk  (07/16/2021)   Overall Financial Resource Strain (CARDIA)    Difficulty of Paying Living Expenses: Not hard at all  Food Insecurity: No Food Insecurity (07/16/2021)   Hunger Vital Sign    Worried About Running Out of Food in the Last Year: Never true    Ran Out of Food in the Last Year: Never true  Transportation Needs: No Transportation Needs (07/16/2021)   PRAPARE - Hydrologist (Medical): No    Lack of Transportation (Non-Medical): No  Physical Activity: Insufficiently Active (07/16/2021)   Exercise Vital Sign    Days of Exercise per Week: 2 days    Minutes of Exercise per Session: 20 min  Stress: Stress Concern Present (07/16/2021)   [place]    Feeling of Stress : Rather much  Social Connections: Moderately Integrated (07/16/2021)   Social Connection and Isolation Panel [NHANES]    Frequency of Communication with Friends and Family: More than three times a week    Frequency of Social Gatherings with Friends and Family: More than three times a week    Attends Religious Services: More than 4 times per year    Active Member of [employer] or Organizations: No    Attends Archivist Meetings: Never    Marital Status: Married  Human resources officer Violence: Not At Risk (07/16/2021)   Humiliation, Afraid, Rape, and Kick questionnaire    Fear of Current or Ex-Partner: No    Emotionally Abused: No    Physically Abused: No    Sexually Abused: No    Review of Systems: See HPI, otherwise  negative ROS  Physical Exam: BP 136/83   Pulse (!) 54   Temp (!) 97 F (36.1 C) (Temporal)   Resp 18   Ht '5\' 6"'$  (1.676 m)   Wt 69.9 kg   SpO2 99%   BMI 24.86 kg/m  General:   Alert, cooperative in NAD Head:  Normocephalic and atraumatic. Respiratory:  Normal work of breathing. Cardiovascular:  RRR  Impression/Plan: Ryan Palermo is here for cataract surgery.  Risks, benefits, limitations, and alternatives regarding cataract surgery have been reviewed with the patient.  Questions have been answered.  All parties agreeable.   Benay Pillow, MD  05/27/2022, 9:52 AM

## 2022-05-28 ENCOUNTER — Encounter: Payer: Self-pay | Admitting: Ophthalmology

## 2022-06-05 DIAGNOSIS — S46012A Strain of muscle(s) and tendon(s) of the rotator cuff of left shoulder, initial encounter: Secondary | ICD-10-CM | POA: Diagnosis not present

## 2022-06-09 ENCOUNTER — Other Ambulatory Visit: Payer: Self-pay | Admitting: Physician Assistant

## 2022-06-09 DIAGNOSIS — F331 Major depressive disorder, recurrent, moderate: Secondary | ICD-10-CM

## 2022-06-09 DIAGNOSIS — F419 Anxiety disorder, unspecified: Secondary | ICD-10-CM

## 2022-06-09 DIAGNOSIS — E7800 Pure hypercholesterolemia, unspecified: Secondary | ICD-10-CM

## 2022-06-25 DIAGNOSIS — Z961 Presence of intraocular lens: Secondary | ICD-10-CM | POA: Diagnosis not present

## 2022-07-25 NOTE — Progress Notes (Signed)
I,Sha'taria Tyson,acting as a Education administrator for [employer], PA-C.,have documented all relevant documentation on the behalf of [name], PA-C,as directed by  [name], PA-C while in the presence of [name], PA-C.   Annual Wellness Visit     Patient: Diana Blanchard, Female    DOB: 09/24/1945, 77 y.o.   MRN: [id number] Visit Date: 07/26/2022  Today's Provider: [name], PA-C   Cc. awv  Subjective    Diana Blanchard is a 77 y.o. female who presents today for her Annual Wellness Visit. She reports consuming a general diet. The patient does not participate in regular exercise at present. She generally feels well. She reports sleeping well. She does not have additional problems to discuss today.   HPI    Medications: Outpatient Medications Prior to Visit  Medication Sig   Multiple Vitamins-Minerals (WOMENS 50+ MULTI VITAMIN/MIN) TABS Take by mouth daily.   simvastatin (ZOCOR) 40 MG tablet TAKE 1 TABLET BY MOUTH DAILY   valACYclovir (VALTREX) 500 MG tablet TAKE 1 TABLET(500 MG) BY MOUTH TWICE DAILY AS NEEDED   [DISCONTINUED] buPROPion (WELLBUTRIN XL) 150 MG 24 hr tablet TAKE 1 TABLET(150 MG) BY MOUTH DAILY   [DISCONTINUED] escitalopram (LEXAPRO) 20 MG tablet TAKE 1 TABLET BY MOUTH EVERY DAY   [DISCONTINUED] azelastine (OPTIVAR) 0.05 % ophthalmic solution Place 1 drop into both eyes 2 (two) times daily. (Patient not taking: Reported on 05/02/2022)   [DISCONTINUED] erythromycin ophthalmic ointment Place 1 Application into both eyes at bedtime. (Patient not taking: Reported on 05/02/2022)   [DISCONTINUED] ketotifen (ALAWAY) 0.025 % ophthalmic solution Place 1 drop into both eyes 2 (two) times daily. (Patient not taking: Reported on 05/02/2022)   No facility-administered medications prior to visit.    Allergies  Allergen Reactions   Latex Itching   Neosporin [Neomycin-Bacitracin Zn-Polymyx]    Prednisone Swelling    And redness   Sulfa Antibiotics Other (See  Comments)   Benzalkonium Chloride Itching and Rash    Patient Care Team: [name], PA-C as PCP - General (Physician Assistant) Ralene Bathe, MD (Dermatology) Arelia Sneddon, [place] (Optometry) Lucky Cowboy Erskine Squibb, MD as Referring Physician (Vascular Surgery) Rosalene Billings., MD (Dentistry)  Review of Systems  Respiratory:  Positive for cough.   Musculoskeletal:  Positive for back pain and neck pain.  Neurological:  Positive for numbness.    Objective    Blood pressure 138/78, pulse (!) 55, height 5' 6"$  (1.676 m), weight 156 lb 6.4 oz (70.9 kg), SpO2 97 %.    Physical Exam Constitutional:      General: She is awake.     Appearance: She is well-developed. She is not ill-appearing.  HENT:     Head: Normocephalic.     Right Ear: Tympanic membrane normal.     Left Ear: Tympanic membrane normal.     Nose: Nose normal. No congestion or rhinorrhea.     Mouth/Throat:     Pharynx: No oropharyngeal exudate or posterior oropharyngeal erythema.  Eyes:     Conjunctiva/sclera: Conjunctivae normal.     Pupils: Pupils are equal, round, and reactive to light.  Neck:     Thyroid: No thyroid mass or thyromegaly.  Cardiovascular:     Rate and Rhythm: Normal rate and regular rhythm.     Heart sounds: Normal heart sounds.  Pulmonary:     Effort: Pulmonary effort is normal.     Breath sounds: Normal breath sounds.  Abdominal:     Palpations: Abdomen is soft.  Tenderness: There is no abdominal tenderness.  Musculoskeletal:     Right lower leg: No swelling. No edema.     Left lower leg: No swelling. No edema.  Lymphadenopathy:     Cervical: No cervical adenopathy.  Skin:    General: Skin is warm.  Neurological:     Mental Status: She is alert and oriented to person, place, and time.  Psychiatric:        Attention and Perception: Attention normal.        Mood and Affect: Mood normal.        Speech: Speech normal.        Behavior: Behavior normal. Behavior is cooperative.     Most recent functional status assessment:    07/26/2022   10:16 AM  In your present state of health, do you have any difficulty performing the following activities:  Hearing? 0  Vision? 0  Difficulty concentrating or making decisions? 0  Walking or climbing stairs? 0  Dressing or bathing? 0  Doing errands, shopping? 0   Most recent fall risk assessment:    07/26/2022   10:16 AM  Fall Risk   Falls in the past year? 0  Number falls in past yr: 0  Injury with Fall? 0  Risk for fall due to : No Fall Risks  Follow up Falls evaluation completed    Most recent depression screenings:    07/26/2022   10:16 AM 02/07/2022    2:24 PM  PHQ 2/9 Scores  PHQ - 2 Score 1 1  PHQ- 9 Score 1 2   Most recent cognitive screening:    07/28/2020    2:37 PM  6CIT Screen  What Year? 0 points  What month? 0 points  What time? 0 points  Count back from 20 0 points  Months in reverse 0 points  Repeat phrase 0 points  Total Score 0 points   Most recent Audit-C alcohol use screening    07/26/2022   10:15 AM  Alcohol Use Disorder Test (AUDIT)  1. How often do you have a drink containing alcohol? 4  2. How many drinks containing alcohol do you have on a typical day when you are drinking? 0  3. How often do you have six or more drinks on one occasion? 0  AUDIT-C Score 4   A score of 3 or more in women, and 4 or more in men indicates increased risk for alcohol abuse, EXCEPT if all of the points are from question 1   No results found for any visits on 07/26/22.  Assessment & Plan     Annual wellness visit done today including the all of the following: Reviewed patient's Family Medical History Reviewed and updated list of patient's medical providers Assessment of cognitive impairment was done Assessed patient's functional ability Established a written schedule for health screening [place] Completed and Reviewed  Exercise Activities and Dietary recommendations   Goals      DIET - EAT MORE FRUITS AND VEGETABLES     Exercise 3x per week (30 min per time)     Recommend to exercise for 3 days a week for at least 30 minutes at a time.         Immunization History  Administered Date(s) Administered   Fluad Quad(high Dose 65+) 03/17/2019, 03/23/2020   Influenza Split 04/03/2009, 03/01/2010, 03/08/2011, 04/06/2012   Influenza, High Dose Seasonal PF 04/19/2014, 04/05/2015, 04/11/2016, 03/26/2017, 04/08/2018   Influenza,inj,Quad PF,6+ Mos 04/07/2013   Influenza-Unspecified  03/26/2021   PFIZER(Purple Top)SARS-COV-2 Vaccination 06/21/2019, 07/12/2019, 04/20/2020   Pfizer Covid-19 Vaccine Bivalent Booster 30yr & up 03/05/2021   Pneumococcal Conjugate-13 06/03/2014   Pneumococcal Polysaccharide-23 06/17/1998, 04/07/2013   Tdap 04/09/2005, 06/02/2015   Zoster Recombinat (Shingrix) 12/02/2017, 04/07/2018   Zoster, Live 01/13/2008    Health Maintenance  Topic Date Due   COVID-19 Vaccine (5 - 2023-24 season) 02/15/2022   DEXA SCAN  07/22/2022   MAMMOGRAM  08/10/2022   Medicare Annual Wellness (AWV)  07/27/2023   DTaP/Tdap/Td (3 - Td or Tdap) 06/01/2025   COLONOSCOPY (Pts 45-454yrInsurance coverage will need to be confirmed)  07/27/2025   Pneumonia Vaccine 6539Years old  Completed   INFLUENZA VACCINE  Completed   Hepatitis C Screening  Completed   Zoster Vaccines- Shingrix  Completed   HPV VACCINES  Aged Out     Discussed health benefits of physical activity, and encouraged her to engage in regular exercise appropriate for her age and condition.    Problem List Items Addressed This Visit       Musculoskeletal and Integument   Osteopenia    Will repeat dexa scan       Relevant Orders   DG Bone Density     Other   Hypercholesterolemia    Repeat fasting lipids Managed on simvastatin 40 mg        Relevant Orders   Lipid panel   Moderate episode of recurrent major depressive disorder (HCC)    Stable managed on wellbutrin and  lexapro      Relevant Medications   buPROPion (WELLBUTRIN XL) 150 MG 24 hr tablet   escitalopram (LEXAPRO) 20 MG tablet   Elevated blood pressure reading    Improved on repeat.  Will continue to monitor       Prediabetes    Historically will repeat a1c      Relevant Orders   Comprehensive Metabolic Panel (CMET)   HgB A1c   Other Visit Diagnoses     Encounter for Medicare annual wellness exam    -  Primary   Annual physical exam       Relevant Orders   CBC w/Diff/Platelet   Comprehensive Metabolic Panel (CMET)   Breast cancer screening by mammogram       Relevant Orders   MM 3D SCREEN BREAST BILATERAL       Recommended RSV vaccine.  Return in about 6 months (around 01/24/2023) for anxiety, depression.     I, LiMikey KirschnerPA-C have reviewed all documentation for this visit. The documentation on  07/26/22 for the exam, diagnosis, procedures, and orders are all accurate and complete.  LiMikey KirschnerPA-C [facility] [id number]: [phone]: [phone]

## 2022-07-26 ENCOUNTER — Ambulatory Visit (INDEPENDENT_AMBULATORY_CARE_PROVIDER_SITE_OTHER): Payer: Medicare Other | Admitting: Physician Assistant

## 2022-07-26 ENCOUNTER — Encounter: Payer: Self-pay | Admitting: Physician Assistant

## 2022-07-26 VITALS — BP 138/78 | HR 55 | Ht 66.0 in | Wt 156.4 lb

## 2022-07-26 DIAGNOSIS — Z0000 Encounter for general adult medical examination without abnormal findings: Secondary | ICD-10-CM | POA: Diagnosis not present

## 2022-07-26 DIAGNOSIS — M8580 Other specified disorders of bone density and structure, unspecified site: Secondary | ICD-10-CM

## 2022-07-26 DIAGNOSIS — R7303 Prediabetes: Secondary | ICD-10-CM

## 2022-07-26 DIAGNOSIS — F331 Major depressive disorder, recurrent, moderate: Secondary | ICD-10-CM

## 2022-07-26 DIAGNOSIS — E7800 Pure hypercholesterolemia, unspecified: Secondary | ICD-10-CM

## 2022-07-26 DIAGNOSIS — R030 Elevated blood-pressure reading, without diagnosis of hypertension: Secondary | ICD-10-CM

## 2022-07-26 DIAGNOSIS — Z1231 Encounter for screening mammogram for malignant neoplasm of breast: Secondary | ICD-10-CM

## 2022-07-26 MED ORDER — ESCITALOPRAM OXALATE 20 MG PO TABS: 20.0000 mg | ORAL_TABLET | Freq: Every day | ORAL | 1 refills | Status: DC

## 2022-07-26 MED ORDER — BUPROPION HCL ER (XL) 150 MG PO TB24: ORAL_TABLET | ORAL | 1 refills | Status: DC

## 2022-07-26 NOTE — Assessment & Plan Note (Signed)
Stable managed on wellbutrin and lexapro

## 2022-07-26 NOTE — Assessment & Plan Note (Signed)
Historically will repeat a1c

## 2022-07-26 NOTE — Assessment & Plan Note (Signed)
Will repeat dexa scan.  

## 2022-07-26 NOTE — Assessment & Plan Note (Signed)
Improved on repeat.  Will continue to monitor

## 2022-07-26 NOTE — Assessment & Plan Note (Signed)
Repeat fasting lipids Managed on simvastatin 40 mg

## 2022-07-27 LAB — CBC WITH DIFFERENTIAL/PLATELET
Basophils Absolute: 0.1 10*3/uL (ref 0.0–0.2)
Basos: 1 %
EOS (ABSOLUTE): 0.3 10*3/uL (ref 0.0–0.4)
Eos: 5 %
Hematocrit: 36.1 % (ref 34.0–46.6)
Hemoglobin: 12.8 g/dL (ref 11.1–15.9)
Immature Grans (Abs): 0 10*3/uL (ref 0.0–0.1)
Immature Granulocytes: 0 %
Lymphocytes Absolute: 1.7 10*3/uL (ref 0.7–3.1)
Lymphs: 35 %
MCH: 33.2 pg — ABNORMAL HIGH (ref 26.6–33.0)
MCHC: 35.5 g/dL (ref 31.5–35.7)
MCV: 94 fL (ref 79–97)
Monocytes Absolute: 0.6 10*3/uL (ref 0.1–0.9)
Monocytes: 12 %
Neutrophils Absolute: 2.3 10*3/uL (ref 1.4–7.0)
Neutrophils: 47 %
Platelets: 282 10*3/uL (ref 150–450)
RBC: 3.85 x10E6/uL (ref 3.77–5.28)
RDW: 11.6 % — ABNORMAL LOW (ref 11.7–15.4)
WBC: 4.9 10*3/uL (ref 3.4–10.8)

## 2022-07-27 LAB — LIPID PANEL
Chol/HDL Ratio: 2.4 ratio (ref 0.0–4.4)
Cholesterol, Total: 199 mg/dL (ref 100–199)
HDL: 82 mg/dL (ref >39)
LDL Chol Calc (NIH): 103 mg/dL — ABNORMAL HIGH (ref 0–99)
Triglycerides: 76 mg/dL (ref 0–149)
VLDL Cholesterol Cal: 14 mg/dL (ref 5–40)

## 2022-07-27 LAB — COMPREHENSIVE METABOLIC PANEL
ALT: 7 IU/L (ref 0–32)
AST: 14 IU/L (ref 0–40)
Albumin/Globulin Ratio: 2.4 — ABNORMAL HIGH (ref 1.2–2.2)
Albumin: 4.6 g/dL (ref 3.8–4.8)
Alkaline Phosphatase: 83 IU/L (ref 44–121)
BUN/Creatinine Ratio: 14 (ref 12–28)
BUN: 15 mg/dL (ref 8–27)
Bilirubin Total: 0.5 mg/dL (ref 0.0–1.2)
CO2: 22 mmol/L (ref 20–29)
Calcium: 9.7 mg/dL (ref 8.7–10.3)
Chloride: 105 mmol/L (ref 96–106)
Creatinine, Ser: 1.04 mg/dL — ABNORMAL HIGH (ref 0.57–1.00)
Globulin, Total: 1.9 g/dL (ref 1.5–4.5)
Glucose: 96 mg/dL (ref 70–99)
Potassium: 4.7 mmol/L (ref 3.5–5.2)
Sodium: 142 mmol/L (ref 134–144)
Total Protein: 6.5 g/dL (ref 6.0–8.5)
eGFR: 56 mL/min/{1.73_m2} — ABNORMAL LOW (ref >59)

## 2022-07-27 LAB — HEMOGLOBIN A1C
Est. average glucose Bld gHb Est-mCnc: 123 mg/dL
Hgb A1c MFr Bld: 5.9 % — ABNORMAL HIGH (ref 4.8–5.6)

## 2022-07-31 DIAGNOSIS — S46012A Strain of muscle(s) and tendon(s) of the rotator cuff of left shoulder, initial encounter: Secondary | ICD-10-CM | POA: Diagnosis not present

## 2022-08-05 ENCOUNTER — Other Ambulatory Visit: Payer: Self-pay | Admitting: Orthopedic Surgery

## 2022-08-14 ENCOUNTER — Encounter: Payer: Self-pay | Admitting: Orthopedic Surgery

## 2022-08-20 ENCOUNTER — Encounter: Payer: Self-pay | Admitting: Orthopedic Surgery

## 2022-08-23 ENCOUNTER — Ambulatory Visit: Payer: Medicare Other | Admitting: Anesthesiology

## 2022-08-23 ENCOUNTER — Encounter
Admission: RE | Disposition: A | Discharge status: Home / Self Care | Payer: Self-pay | Source: Home / Self Care | Attending: Orthopedic Surgery

## 2022-08-23 ENCOUNTER — Ambulatory Visit
Admission: RE | Admit: 2022-08-23 | Discharge: 2022-08-23 | Disposition: A | Discharge status: Home / Self Care | Payer: Medicare Other | Attending: Orthopedic Surgery | Admitting: Orthopedic Surgery

## 2022-08-23 ENCOUNTER — Other Ambulatory Visit: Payer: Self-pay

## 2022-08-23 ENCOUNTER — Encounter: Payer: Self-pay | Admitting: Orthopedic Surgery

## 2022-08-23 DIAGNOSIS — G8918 Other acute postprocedural pain: Secondary | ICD-10-CM | POA: Diagnosis not present

## 2022-08-23 DIAGNOSIS — X58XXXA Exposure to other specified factors, initial encounter: Secondary | ICD-10-CM | POA: Diagnosis not present

## 2022-08-23 DIAGNOSIS — T7840XA Allergy, unspecified, initial encounter: Secondary | ICD-10-CM | POA: Diagnosis not present

## 2022-08-23 DIAGNOSIS — S46812A Strain of other muscles, fascia and tendons at shoulder and upper arm level, left arm, initial encounter: Secondary | ICD-10-CM | POA: Diagnosis not present

## 2022-08-23 DIAGNOSIS — Z87891 Personal history of nicotine dependence: Secondary | ICD-10-CM | POA: Diagnosis not present

## 2022-08-23 DIAGNOSIS — M25812 Other specified joint disorders, left shoulder: Secondary | ICD-10-CM | POA: Diagnosis not present

## 2022-08-23 DIAGNOSIS — M7542 Impingement syndrome of left shoulder: Secondary | ICD-10-CM | POA: Diagnosis not present

## 2022-08-23 DIAGNOSIS — M7582 Other shoulder lesions, left shoulder: Secondary | ICD-10-CM | POA: Diagnosis not present

## 2022-08-23 DIAGNOSIS — M7522 Bicipital tendinitis, left shoulder: Secondary | ICD-10-CM | POA: Diagnosis not present

## 2022-08-23 DIAGNOSIS — S46012A Strain of muscle(s) and tendon(s) of the rotator cuff of left shoulder, initial encounter: Secondary | ICD-10-CM | POA: Diagnosis not present

## 2022-08-23 DIAGNOSIS — M19012 Primary osteoarthritis, left shoulder: Secondary | ICD-10-CM | POA: Diagnosis not present

## 2022-08-23 DIAGNOSIS — F418 Other specified anxiety disorders: Secondary | ICD-10-CM | POA: Diagnosis not present

## 2022-08-23 SURGERY — SHOULDER ARTHROSCOPY WITH SUBACROMIAL DECOMPRESSION AND DISTAL CLAVICLE EXCISION
Anesthesia: General | Site: Shoulder | Laterality: Left

## 2022-08-23 MED ORDER — LACTATED RINGERS IV SOLN
INTRAVENOUS | Status: DC | PRN
  Administered 2022-08-23 (×2): 6000 mL

## 2022-08-23 MED ORDER — LACTATED RINGERS IV SOLN: INTRAVENOUS | Status: DC

## 2022-08-23 MED ORDER — ONDANSETRON HCL 4 MG/2ML IJ SOLN
INTRAMUSCULAR | Status: DC | PRN
  Administered 2022-08-23: 4 mg via INTRAVENOUS

## 2022-08-23 MED ORDER — ASPIRIN 325 MG PO TBEC: 325.0000 mg | DELAYED_RELEASE_TABLET | Freq: Every day | ORAL | 0 refills | Status: AC

## 2022-08-23 MED ORDER — OXYCODONE HCL 5 MG PO TABS: 5.0000 mg | ORAL_TABLET | Freq: Once | ORAL | Status: DC | PRN

## 2022-08-23 MED ORDER — FENTANYL CITRATE (PF) 100 MCG/2ML IJ SOLN
INTRAMUSCULAR | Status: DC | PRN
  Administered 2022-08-23: 50 ug via INTRAVENOUS

## 2022-08-23 MED ORDER — OXYCODONE HCL 5 MG/5ML PO SOLN: 5.0000 mg | Freq: Once | ORAL | Status: DC | PRN

## 2022-08-23 MED ORDER — LACTATED RINGERS IR SOLN
Status: DC | PRN
  Administered 2022-08-23 (×2): 6000 mL
  Administered 2022-08-23: 3000 mL

## 2022-08-23 MED ORDER — PROPOFOL 10 MG/ML IV BOLUS
INTRAVENOUS | Status: DC | PRN
  Administered 2022-08-23: 150 mg via INTRAVENOUS

## 2022-08-23 MED ORDER — EPHEDRINE SULFATE (PRESSORS) 50 MG/ML IJ SOLN
INTRAMUSCULAR | Status: DC | PRN
  Administered 2022-08-23 (×3): 10 mg via INTRAVENOUS
  Administered 2022-08-23: 5 mg via INTRAVENOUS

## 2022-08-23 MED ORDER — FENTANYL CITRATE PF 50 MCG/ML IJ SOSY: 25.0000 ug | PREFILLED_SYRINGE | INTRAMUSCULAR | Status: DC | PRN

## 2022-08-23 MED ORDER — ACETAMINOPHEN 500 MG PO TABS: 1000.0000 mg | ORAL_TABLET | Freq: Three times a day (TID) | ORAL | 2 refills | Status: AC

## 2022-08-23 MED ORDER — SUCCINYLCHOLINE CHLORIDE 200 MG/10ML IV SOSY
PREFILLED_SYRINGE | INTRAVENOUS | Status: DC | PRN
  Administered 2022-08-23: 100 mg via INTRAVENOUS

## 2022-08-23 MED ORDER — ONDANSETRON 4 MG PO TBDP: 4.0000 mg | ORAL_TABLET | Freq: Three times a day (TID) | ORAL | 0 refills | Status: DC | PRN

## 2022-08-23 MED ORDER — MIDAZOLAM HCL 5 MG/5ML IJ SOLN
INTRAMUSCULAR | Status: DC | PRN
  Administered 2022-08-23: 2 mg via INTRAVENOUS

## 2022-08-23 MED ORDER — OXYCODONE HCL 5 MG PO TABS: 5.0000 mg | ORAL_TABLET | ORAL | 0 refills | Status: DC | PRN

## 2022-08-23 MED ORDER — CEFAZOLIN SODIUM-DEXTROSE 2-4 GM/100ML-% IV SOLN
2.0000 g | INTRAVENOUS | Status: AC
  Administered 2022-08-23: 2 g via INTRAVENOUS

## 2022-08-23 MED ORDER — DEXMEDETOMIDINE HCL IN NACL 400 MCG/100ML IV SOLN
INTRAVENOUS | Status: DC | PRN
  Administered 2022-08-23 (×4): 4 ug via INTRAVENOUS

## 2022-08-23 SURGICAL SUPPLY — 53 items
ADH SKN CLS APL DERMABOND .7 (GAUZE/BANDAGES/DRESSINGS)
ADPR IRR PORT MULTIBAG TUBE (MISCELLANEOUS) ×1
ANCH SUT 2.9 PUSHLOCK ANCH (Orthopedic Implant) ×1 IMPLANT
ANCH SUT 2X2.3 TAPE (Anchor) ×2 IMPLANT
ANCHOR 2.3 SP SGL 1.2 XBRAID (Anchor) IMPLANT
ANCHOR SWIVELOCK SP KL 4.75 (Anchor) IMPLANT
APL PRP STRL LF DISP 70% ISPRP (MISCELLANEOUS) ×1
BLADE SHAVER 4.5X7 STR FR (MISCELLANEOUS) ×1 IMPLANT
BUR BR 5.5 WIDE MOUTH (BURR) ×1 IMPLANT
CANNULA PART THRD DISP 5.75X7 (CANNULA) IMPLANT
CANNULA PARTIAL THREAD 2X7 (CANNULA) IMPLANT
CANNULA TWIST IN 8.25X7CM (CANNULA) IMPLANT
CHLORAPREP W/TINT 26 (MISCELLANEOUS) ×1 IMPLANT
COOLER POLAR GLACIER W/PUMP (MISCELLANEOUS) ×1 IMPLANT
COVER LIGHT HANDLE UNIVERSAL (MISCELLANEOUS) ×2 IMPLANT
DERMABOND ADVANCED .7 DNX12 (GAUZE/BANDAGES/DRESSINGS) IMPLANT
DRAPE INCISE IOBAN 66X45 STRL (DRAPES) ×1 IMPLANT
DRAPE U-SHAPE 48X52 POLY STRL (PACKS) ×1 IMPLANT
DRSG TEGADERM 4X4.75 (GAUZE/BANDAGES/DRESSINGS) ×3 IMPLANT
ELECT REM PT RETURN 9FT ADLT (ELECTROSURGICAL)
ELECTRODE REM PT RTRN 9FT ADLT (ELECTROSURGICAL) IMPLANT
GAUZE SPONGE 4X4 12PLY STRL (GAUZE/BANDAGES/DRESSINGS) ×1 IMPLANT
GAUZE XEROFORM 1X8 LF (GAUZE/BANDAGES/DRESSINGS) ×1 IMPLANT
GLOVE SRG 8 PF TXTR STRL LF DI (GLOVE) ×3 IMPLANT
GLOVE SURG UNDER POLY LF SZ8 (GLOVE) ×3
GOWN STRL REIN 2XL XLG LVL4 (GOWN DISPOSABLE) ×1 IMPLANT
GOWN STRL REUS W/ TWL LRG LVL3 (GOWN DISPOSABLE) ×3 IMPLANT
GOWN STRL REUS W/TWL LRG LVL3 (GOWN DISPOSABLE) ×3
IV LACTATED RINGER IRRG 3000ML (IV SOLUTION) ×9
IV LR IRRIG 3000ML ARTHROMATIC (IV SOLUTION) ×6 IMPLANT
KIT STABILIZATION SHOULDER (MISCELLANEOUS) ×1 IMPLANT
KIT TURNOVER KIT A (KITS) ×1 IMPLANT
MANIFOLD NEPTUNE II (INSTRUMENTS) ×1 IMPLANT
MASK FACE SPIDER DISP (MASK) ×1 IMPLANT
MAT GRAY ABSORB FLUID 28X50 (MISCELLANEOUS) ×2 IMPLANT
PACK ARTHROSCOPY SHOULDER (MISCELLANEOUS) ×1 IMPLANT
PAD ABD DERMACEA PRESS 5X9 (GAUZE/BANDAGES/DRESSINGS) ×2 IMPLANT
PAD WRAPON POLAR SHDR XLG (MISCELLANEOUS) ×1 IMPLANT
PASSER SUT FIRSTPASS SELF (INSTRUMENTS) IMPLANT
SET Y ADAPTER MULIT-BAG IRRIG (MISCELLANEOUS) ×2 IMPLANT
SPONGE T-LAP 18X18 ~~LOC~~+RFID (SPONGE) ×1 IMPLANT
SUT ETHILON 3-0 FS-10 30 BLK (SUTURE) ×2
SUT MNCRL 4-0 (SUTURE)
SUT MNCRL 4-0 27XMFL (SUTURE)
SUT VIC AB 2-0 CT2 27 (SUTURE) IMPLANT
SUTURE EHLN 3-0 FS-10 30 BLK (SUTURE) ×1 IMPLANT
SUTURE MNCRL 4-0 27XMF (SUTURE) IMPLANT
SYSTEM IMPL TENODESIS LNT 2.9 (Orthopedic Implant) IMPLANT
TUBING CONNECTING 10 (TUBING) ×1 IMPLANT
TUBING INFLOW SET DBFLO PUMP (TUBING) ×1 IMPLANT
TUBING OUTFLOW SET DBLFO PUMP (TUBING) ×1 IMPLANT
WAND WEREWOLF FLOW 90D (MISCELLANEOUS) ×1 IMPLANT
WRAPON POLAR PAD SHDR XLG (MISCELLANEOUS) ×1

## 2022-08-23 NOTE — Discharge Instructions (Signed)
Post-Op Instructions - Rotator Cuff Repair ? ?1. Bracing: You will wear a shoulder immobilizer or sling for 6 weeks.  ? ?2. Driving: No driving for 3 weeks post-op. When driving, do not wear the immobilizer. Ideally, we recommend no driving for 6 weeks while sling is in place as one arm will be immobilized.  ? ?3. Activity: No active lifting for 2 months. Wrist, hand, and elbow motion only. Avoid lifting the upper arm away from the body except for hygiene. You are permitted to bend and straighten the elbow passively only (no active elbow motion). You may use your hand and wrist for typing, writing, and managing utensils (cutting food). Do not lift more than a coffee cup for 8 weeks.  When sleeping or resting, inclined positions (recliner chair or wedge pillow) and a pillow under the forearm for support may provide better comfort for up to 4 weeks.  Avoid long distance travel for 4 weeks. ? ?Return to normal activities after rotator cuff repair repair normally takes 6 months on average. If rehab goes very well, may be able to do most activities at 4 months, except overhead or contact sports. ? ?4. Physical Therapy: Begins 3-4 days after surgery, and proceed 1 time per week for the first 6 weeks, then 1-2 times per week from weeks 6-20 post-op. ? ?5. Medications:  ?- You will be provided a prescription for narcotic pain medicine. After surgery, take 1-2 narcotic tablets every 4 hours if needed for severe pain.  ?- A prescription for anti-nausea medication will be provided in case the narcotic medicine causes nausea - take 1 tablet every 6 hours only if nauseated.   ?- Take tylenol 1000 mg (2 Extra Strength tablets or 3 regular strength) every 8 hours for pain.  May decrease or stop tylenol 5 days after surgery if you are having minimal pain. ?- Take ASA 325mg/day x 2 weeks to help prevent DVTs/PEs (blood clots).  ?- DO NOT take ANY nonsteroidal anti-inflammatory pain medications (Advil, Motrin, Ibuprofen, Aleve,  Naproxen, or Naprosyn). These medicines can inhibit healing of your shoulder repair.  ? ? ?If you are taking prescription medication for anxiety, depression, insomnia, muscle spasm, chronic pain, or for attention deficit disorder, you are advised that you are at a higher risk of adverse effects with use of narcotics post-op, including narcotic addiction/dependence, depressed breathing, death. ?If you use non-prescribed substances: alcohol, marijuana, cocaine, heroin, methamphetamines, etc., you are at a higher risk of adverse effects with use of narcotics post-op, including narcotic addiction/dependence, depressed breathing, death. ?You are advised that taking > 50 morphine milligram equivalents (MME) of narcotic pain medication per day results in twice the risk of overdose or death. For your prescription provided: oxycodone 5 mg - taking more than 6 tablets per day would result in > 50 morphine milligram equivalents (MME) of narcotic pain medication. ?Be advised that we will prescribe narcotics short-term, for acute post-operative pain only - 3 weeks for major operations such as shoulder repair/reconstruction surgeries.  ? ? ? ?6. Post-Op Appointment: ? ?Your first post-op appointment will be 10-14 days post-op. ? ?7. Work or School: For most, but not all procedures, we advise staying out of work or school for at least 1 to 2 weeks in order to recover from the stress of surgery and to allow time for healing.  ? ?If you need a work or school note this can be provided.  ? ?8. Smoking: If you are a smoker, you need to refrain from   smoking in the postoperative period. The nicotine in cigarettes will inhibit healing of your shoulder repair and decrease the chance of successful repair. Similarly, nicotine containing products (gum, patches) should be avoided.  ? ?Post-operative Brace: ?Apply and remove the brace you received as you were instructed to at the time of fitting and as described in detail as the brace?s  instructions for use indicate.  Wear the brace for the period of time prescribed by your physician.  The brace can be cleaned with soap and water and allowed to air dry only.  Should the brace result in increased pain, decreased feeling (numbness/tingling), increased swelling or an overall worsening of your medical condition, please contact your doctor immediately.  If an emergency situation occurs as a result of wearing the brace after normal business hours, please dial 911 and seek immediate medical attention.  Let your doctor know if you have any further questions about the brace issued to you. ?Refer to the shoulder sling instructions for use if you have any questions regarding the correct fit of your shoulder sling.  ?[facility] Customer Care for Troubleshooting: [phone] ? ?Video that illustrates how to properly use a shoulder sling: ?"Instructions for Proper Use of an Orthopaedic Sling" ?https://www.youtube.com/watch?v=AHZpn_Xo45w ? ? ? ? ?

## 2022-08-23 NOTE — H&P (Signed)
Paper H&P to be scanned into permanent record. H&P reviewed. No significant changes noted.  

## 2022-08-23 NOTE — Op Note (Signed)
SURGERY DATE: 08/23/2022   PRE-OP DIAGNOSIS:  1. Left subacromial impingement 2. Left biceps tendinopathy 3. Left rotator cuff tear 4. Left acromioclavicular joint arthritis  POST-OP DIAGNOSIS: 1. Left subacromial impingement 2. Left biceps tendinopathy 3. Left rotator cuff tear 4. Left acromioclavicular joint arthritis  PROCEDURES:  1. Left arthroscopic rotator cuff repair 2. Left arthroscopic biceps tenodesis 3. Left arthroscopic subacromial decompression 4. Left arthroscopic extensive debridement of shoulder (glenohumeral and subacromial spaces) 5. Left arthroscopic distal clavicle excision  SURGEON: Cato Mulligan, MD   ASSISTANT: Anitra Lauth, PA   ANESTHESIA: Gen with Exparel interscalene block   ESTIMATED BLOOD LOSS: 5cc   DRAINS:  none   TOTAL IV FLUIDS: per anesthesia      SPECIMENS: none   IMPLANTS:  - Arthrex 2.66m PushLock x 1 - Arthrex 4.781mSwiveLock x 2 - Iconix SPEED double loaded with 1.2 and 2.20m69mape x 2     OPERATIVE FINDINGS:  Examination under anesthesia: A careful examination under anesthesia was performed.  Passive range of motion was: FF: 160; ER at side: 60; ER in abduction: 110; IR in abduction: 45.  Anterior load shift: NT.  Posterior load shift: NT.  Sulcus in neutral: NT.  Sulcus in ER: NT.     Intra-operative findings: A thorough arthroscopic examination of the shoulder was performed.  The findings are: 1. Biceps tendon: tendinopathy with erythema  2. Superior labrum: erythema 3. Posterior labrum and capsule: normal 4. Inferior capsule and inferior recess: normal 5. Glenoid cartilage surface: Normal 6. Supraspinatus attachment: full-thickness tear of the supraspinatus 7. Posterior rotator cuff attachment: normal 8. Humeral head articular cartilage: normal except for focal area of full-thickness cartilage and bone loss on the anterior aspect of the humeral head measuring approximately 15 x 10 mm 9. Rotator interval: significant  synovitis 10: Subscapularis tendon: attachment intact 11. Anterior labrum: Mildly degenerative 12. IGHL: normal   OPERATIVE REPORT:    Indications for procedure:  Diana Blanchard a 76 1o. female with approximately 6 months of left shoulder pain after she felt popping sensations about the shoulder.  She has had difficulty with overhead motion since that time with sensations of weakness. Clinical exam and MRI were suggestive of rotator cuff tear, biceps tendinopathy, acromioclavicular joint arthritis and subacromial impingement.  She has failed extensive nonoperative management.  After discussion of risks, benefits, and alternatives to surgery, the patient elected to proceed.    Procedure in detail:   I identified Diana Blanchard the pre-operative holding area.  I marked the operative shoulder with my initials. I reviewed the risks and benefits of the proposed surgical intervention, and the patient wished to proceed.  Anesthesia was then performed with an Exparel interscalene block.  The patient was transferred to the operative suite and placed in the beach chair position.     Appropriate IV antibiotics were administered prior to incision. The operative upper extremity was then prepped and draped in standard fashion. A time out was performed confirming the correct extremity, correct patient, and correct procedure.    I then created a standard posterior portal with an 11 blade. The glenohumeral joint was easily entered with a blunt trocar and the arthroscope introduced. The findings of diagnostic arthroscopy are described above. I debrided degenerative tissue including the synovitic tissue about the rotator interval and anterior and superior labrum.  I also debrided the cartilage surface of the humeral head in the area of the focal defect.  I then coagulated the inflamed synovium to  obtain hemostasis and reduce the risk of post-operative swelling using an Arthrocare radiofrequency device.   I then  turned my attention to the arthroscopic biceps tenodesis. The Loop n Tack technique was used to pass a FiberTape through the biceps in a locked fashion adjacent to the biceps anchor.  A hole for a 2.9 mm Arthrex PushLock was drilled in the bicipital groove just superior to the subscapularis tendon insertion.  The biceps tendon was then cut and the biceps anchor complex was debrided down to a stable base on the superior labrum.  The FiberTape was loaded onto the PushLock anchor and impacted into place into the previously drilled hole in the bicipital groove.  This appropriately secured the biceps into the bicipital groove and took it off of tension.   Next, the arthroscope was then introduced into the subacromial space. A direct lateral portal was created with an 11-blade after spinal needle localization. An extensive subacromial bursectomy and debridement was performed using a combination of the shaver and Arthrocare wand. The entire acromial undersurface was exposed and the CA ligament was subperiosteally elevated to expose the anterior acromial hook. A burr was used to create a flat anterior and lateral aspect of the acromion, converting it from a Type 2 to a Type 1 acromion. Care was made to keep the deltoid fascia intact.   I then turned my attention to the arthroscopic distal clavicle excision. I identified the acromioclavicular joint. Surrounding bursal tissue was debrided and the edges of the joint were identified. I used the 5.2m barrel burr to remove the distal clavicle parallel to the edge of the acromion. I was able to fit two widths of the burr into the space between the distal clavicle and acromion, signifying that I had removed ~176mof distal clavicle. This was confirmed by viewing anteriorly and introducing a probe with measuring marks from the lateral portal. Hemostasis was achieved with an Arthrocare wand.   Next, I created an accessory posterolateral portal to assist with visualization and  instrumentation.  I debrided the poor quality edges of the supraspinatus tendon.  This was a U-shaped tear of the supraspinatus.  I prepared the footprint using a burr to expose bleeding bone.     I then percutaneously placed 1 Iconix SPEED medial row anchor along the anterior portion of the tear at the articular margin. Another SPEED anchor was placed along the posterior portion of the tear at the articular margin. I then shuttled all 8 strands of tape through the rotator cuff just lateral to the musculotendinous junction using a FirstPass suture passer spanning the anterior to posterior extent of the tear. The posterior strands of each suture were passed through an ArHCA Incnchor.  This was placed approximately 2 cm distal to the lateral edge of the footprint in line with the posterior aspect of the tear with appropriate tensioning of each suture prior to final fixation.  Similarly, the anterior strands of each suture were passed through another SwiveLock anchor along the anterior margin of the tear.  There were 2 small dogears, one anteriorly and 1 posteriorly.  The knotless mechanism of the SwiveLock anchors were utilized to reduce a small dogear anteriorly and to reinforce the anterior cable.  This construct allowed for excellent reapproximation of the rotator cuff to its native footprint without undue tension.  Appropriate compression was achieved.  The repair was stable to external and internal rotation.   Fluid was evacuated from the shoulder, and the portals were closed with 3-0  Nylon. Xeroform was applied to the portals. A sterile dressing was applied, followed by a Polar Care sleeve and a SlingShot shoulder immobilizer/sling. The patient was awakened from anesthesia without difficulty and was transferred to the PACU in stable condition.    Of note, assistance from a PA was essential to performing the surgery.  PA was present for the entire surgery.  PA assisted with patient positioning,  retraction, instrumentation, and wound closure. The surgery would have been more difficult and had longer operative time without PA assistance.   COMPLICATIONS: none   DISPOSITION: plan for discharge home after recovery in PACU     POSTOPERATIVE PLAN: Remain in sling (except hygiene and elbow/wrist/hand RoM exercises as instructed by PT) x 6 weeks and NWB for this time. PT to begin 3-4 days after surgery.  Large rotator cuff repair rehab protocol. ASA '325mg'$  daily x 2 weeks for DVT ppx.

## 2022-08-23 NOTE — Anesthesia Procedure Notes (Signed)
Anesthesia Regional Block: Interscalene brachial plexus block   Pre-Anesthetic Checklist: , timeout performed,  Correct Patient, Correct Site, Correct Laterality,  Correct Procedure, Correct Position, site marked,  Risks and benefits discussed,  Surgical consent,  Pre-op evaluation,  At surgeon's request and post-op pain management  Laterality: Left  Prep: chloraprep       Needles:  Injection technique: Single-shot  Needle Type: Echogenic Needle     Needle Length: 4cm  Needle Gauge: 25     Additional Needles:   Procedures:,,,, ultrasound used (permanent image in chart),,    Narrative:  Start time: 08/23/2022 7:30 AM End time: 08/23/2022 7:34 AM Injection made incrementally with aspirations every 5 mL.  Performed by: Personally  Anesthesiologist: Dimas Millin, MD  Additional Notes: Patient's chart reviewed and they were deemed appropriate candidate for procedure, at surgeon's request. Patient educated about risks, benefits, and alternatives of the block including but not limited to: temporary or permanent nerve damage, bleeding, infection, damage to surround tissues, pneumothorax, hemidiaphragmatic paralysis, unilateral Horner's syndrome, block failure, local anesthetic toxicity. Patient expressed understanding. A formal time-out was conducted consistent with institution rules.  Monitors were applied, and minimal sedation used (see nursing record). The site was prepped with skin prep and allowed to dry, and sterile gloves were used. A high frequency linear ultrasound probe with probe cover was utilized throughout. C5-7 nerve roots located and appeared anatomically normal, local anesthetic injected around them, and echogenic block needle trajectory was monitored throughout. Aspiration performed every 79m. Lung and blood vessels were avoided. All injections were performed without resistance and free of blood and paresthesias. The patient tolerated the procedure well.  Injectate: 235m exparel + 1030m.5% bupivacaine

## 2022-08-23 NOTE — Progress Notes (Signed)
Assisted [facility] with left, interscalene , ultrasound guided block. Side rails up, monitors on throughout procedure. See vital signs in flow sheet. Tolerated Procedure well.

## 2022-08-23 NOTE — Anesthesia Procedure Notes (Signed)
Procedure Name: Intubation Date/Time: 08/23/2022 8:25 AM  Performed by: Moises Blood, CRNAPre-anesthesia Checklist: Patient identified, Emergency Drugs available, Suction available, Patient being monitored and Timeout performed Patient Re-evaluated:Patient Re-evaluated prior to induction Oxygen Delivery Method: Circle system utilized Preoxygenation: Pre-oxygenation with 100% oxygen Induction Type: IV induction Ventilation: Mask ventilation without difficulty Laryngoscope Size: Mac and 3 Grade View: Grade III Tube type: Oral Tube size: 7.0 mm Number of attempts: 1 Airway Equipment and Method: Stylet Placement Confirmation: ETT inserted through vocal cords under direct vision, positive ETCO2, CO2 detector and breath sounds checked- equal and bilateral Secured at: 23 cm Tube secured with: Tape Dental Injury: Teeth and Oropharynx as per pre-operative assessment

## 2022-08-23 NOTE — Transfer of Care (Signed)
Immediate Anesthesia Transfer of Care Note  Patient: Diana Blanchard  Procedure(s) Performed: Left shoulder arthroscopic rotator cuff repair, subacromial decompression, and biceps tenodesis (Left: Shoulder)  Patient Location: PACU  Anesthesia Type: General ETT  Level of Consciousness: awake, alert  and patient cooperative  Airway and Oxygen Therapy: Patient Spontanous Breathing and Patient connected to supplemental oxygen  Post-op Assessment: Post-op Vital signs reviewed, Patient's Cardiovascular Status Stable, Respiratory Function Stable, Patent Airway and No signs of Nausea or vomiting  Post-op Vital Signs: Reviewed and stable  Complications: No notable events documented.

## 2022-08-23 NOTE — Anesthesia Preprocedure Evaluation (Signed)
Anesthesia Evaluation  Patient identified by MRN, date of birth, ID band Patient awake    Reviewed: Allergy & Precautions, H&P , NPO status , Patient's Chart, lab work & pertinent test results, reviewed documented beta blocker date and time   Airway Mallampati: II  TM Distance: >3 FB Neck ROM: full    Dental no notable dental hx. (+) Chipped, Dental Advidsory Given   Pulmonary neg pulmonary ROS, neg shortness of breath, neg COPD, former smoker   Pulmonary exam normal breath sounds clear to auscultation       Cardiovascular Exercise Tolerance: Good On Medications (-) angina (-) Past MI and (-) CABG negative cardio ROS Normal cardiovascular exam Rhythm:regular Rate:Normal     Neuro/Psych   Anxiety Depression    negative neurological ROS  negative psych ROS   GI/Hepatic negative GI ROS, Neg liver ROS,  Medicated,,  Endo/Other  negative endocrine ROSWell Controlled    Renal/GU      Musculoskeletal   Abdominal   Peds  Hematology negative hematology ROS (+)   Anesthesia Other Findings Past Medical History: No date: Anxiety No date: Arthritis     Comment:  fingers No date: Billowing mitral valve No date: Cataract     Comment:  Surgery on both eyes No date: Depression 05/20/2006: Dysplastic nevus     Comment:  R lat buttock - moderate 12/31/2006: Dysplastic nevus     Comment:  R back braline - moderate, excised 04/07/2007 10/06/2008: Dysplastic nevus     Comment:  R back paraspinal med to inf scapula - mild to mod No date: GERD (gastroesophageal reflux disease) No date: Heart murmur     Comment:  MVP No date: Hypercholesterolemia No date: Mitral valve prolapse 2004: Neuromuscular disorder ([place])     Comment:  Carpol tunnel No date: Varicose vein of leg  Past Surgical History: 1990: bowel obstruction 1980's: BREAST CYST ASPIRATION; Right 05/13/2022: CATARACT EXTRACTION W/PHACO; Left     Comment:  Procedure:  CATARACT EXTRACTION PHACO AND INTRAOCULAR               LENS PLACEMENT ([place]) LEFT;  Surgeon: Eulogio Bear,               MD;  Location: [place];  Service:               Ophthalmology;  Laterality: Left;  3.38 10:29.1 05/27/2022: CATARACT EXTRACTION W/PHACO; Right     Comment:  Procedure: CATARACT EXTRACTION PHACO AND INTRAOCULAR               LENS PLACEMENT ([facility]) RIGHT  2.60  00:26.7;  Surgeon:               Eulogio Bear, MD;  Location: [place];                Service: Ophthalmology;  Laterality: Right; 06/29/2015: COLONOSCOPY WITH PROPOFOL; N/A     Comment:  Procedure: COLONOSCOPY WITH PROPOFOL;  Surgeon: Manya Silvas, MD;  Location[facility] ENDOSCOPY;  Service:               Endoscopy;  Laterality: N/A; 07/27/2020: COLONOSCOPY WITH PROPOFOL; N/A     Comment:  Procedure: COLONOSCOPY WITH PROPOFOL;  Surgeon: Lucilla Lame, MD;  Location: [place];  Service:  Endoscopy;  Laterality: N/A;  priority 4 1981: cyst removed     Comment:  on the ovary 11/23: EYE SURGERY     Comment:  Cataract surgery on both eyes 1970: LAPAROSCOPY     Comment:  complications from IUD [id number]: POLYPECTOMY; N/A     Comment:  Procedure: POLYPECTOMY;  Surgeon: Lucilla Lame, MD;                Location: [place];  Service: Endoscopy;                Laterality: N/A; 06/2011: ROTATOR CUFF REPAIR; Right 1953: TONSILLECTOMY AND ADENOIDECTOMY 1988?: TUBAL LIGATION  BMI    Body Mass Index: 25.13 kg/m      Reproductive/Obstetrics negative OB ROS                             Anesthesia Physical Anesthesia Plan  ASA: 2  Anesthesia Plan: General ETT   Post-op Pain Management: Regional block   Induction: Intravenous  PONV Risk Score and Plan: 3 and Ondansetron, Dexamethasone, Midazolam and Treatment may vary due to age or medical condition  Airway Management Planned: Oral  ETT  Additional Equipment:   Intra-op Plan:   Post-operative Plan: Extubation in OR  Informed Consent: I have reviewed the patients History and Physical, chart, labs and discussed the procedure including the risks, benefits and alternatives for the proposed anesthesia with the patient or authorized representative who has indicated his/her understanding and acceptance.     Dental Advisory Given  Plan Discussed with: Anesthesiologist, CRNA and Surgeon  Anesthesia Plan Comments: (Patient consented for risks of anesthesia including but not limited to:  - adverse reactions to medications - damage to eyes, teeth, lips or other oral mucosa - nerve damage due to positioning  - sore throat or hoarseness - Damage to heart, brain, nerves, lungs, other parts of body or loss of life  Patient voiced understanding.)       Anesthesia Quick Evaluation

## 2022-08-24 NOTE — Anesthesia Postprocedure Evaluation (Signed)
Anesthesia Post Note  Patient: Diana Blanchard  Procedure(s) Performed: Left shoulder arthroscopic rotator cuff repair, subacromial decompression, and biceps tenodesis (Left: Shoulder)  Patient location during evaluation: PACU Anesthesia Type: General Level of consciousness: awake and alert Pain management: pain level controlled Vital Signs Assessment: post-procedure vital signs reviewed and stable Respiratory status: spontaneous breathing, nonlabored ventilation, respiratory function stable and patient connected to nasal cannula oxygen Cardiovascular status: blood pressure returned to baseline and stable Postop Assessment: no apparent nausea or vomiting Anesthetic complications: no   No notable events documented.   Last Vitals:  Vitals:   08/23/22 1015 08/23/22 1030  BP: 114/69 112/68  Pulse: 65 64  Resp: 15 15  Temp: (!) 36.3 C (!) 36.3 C  SpO2: 92% 91%    Last Pain:  Vitals:   08/23/22 1030  TempSrc:   PainSc: 0-No pain                 Dimas Millin

## 2022-08-26 DIAGNOSIS — Z9889 Other specified postprocedural states: Secondary | ICD-10-CM | POA: Diagnosis not present

## 2022-08-26 DIAGNOSIS — M25612 Stiffness of left shoulder, not elsewhere classified: Secondary | ICD-10-CM | POA: Diagnosis not present

## 2022-08-26 DIAGNOSIS — M25512 Pain in left shoulder: Secondary | ICD-10-CM | POA: Diagnosis not present

## 2022-08-26 DIAGNOSIS — R531 Weakness: Secondary | ICD-10-CM | POA: Diagnosis not present

## 2022-09-04 DIAGNOSIS — M25612 Stiffness of left shoulder, not elsewhere classified: Secondary | ICD-10-CM | POA: Diagnosis not present

## 2022-09-04 DIAGNOSIS — Z9889 Other specified postprocedural states: Secondary | ICD-10-CM | POA: Diagnosis not present

## 2022-09-07 ENCOUNTER — Other Ambulatory Visit: Payer: Self-pay | Admitting: Family Medicine

## 2022-09-07 DIAGNOSIS — E7800 Pure hypercholesterolemia, unspecified: Secondary | ICD-10-CM

## 2022-09-09 DIAGNOSIS — Z9889 Other specified postprocedural states: Secondary | ICD-10-CM | POA: Diagnosis not present

## 2022-09-09 DIAGNOSIS — M25512 Pain in left shoulder: Secondary | ICD-10-CM | POA: Diagnosis not present

## 2022-09-09 NOTE — Telephone Encounter (Signed)
Requested Prescriptions  Pending Prescriptions Disp Refills   simvastatin (ZOCOR) 40 MG tablet [Pharmacy Med Name: SIMVASTATIN 40MG  TABLETS] 90 tablet 3    Sig: TAKE 1 TABLET BY MOUTH DAILY     Cardiovascular:  Antilipid - Statins Failed - 09/07/2022  7:02 AM      Failed - Lipid Panel in normal range within the last 12 months    Cholesterol, Total  Date Value Ref Range Status  07/26/2022 199 100 - 199 mg/dL Final   LDL Chol Calc (NIH)  Date Value Ref Range Status  07/26/2022 103 (H) 0 - 99 mg/dL Final   HDL  Date Value Ref Range Status  07/26/2022 82 >39 mg/dL Final   Triglycerides  Date Value Ref Range Status  07/26/2022 76 0 - 149 mg/dL Final         Passed - Patient is not pregnant      Passed - Valid encounter within last 12 months    Recent Outpatient Visits           1 month ago Encounter for [employer] annual wellness exam   [place], [place], PA-C   7 months ago Acute viral conjunctivitis of left eye   [place], [place], PA-C   1 year ago Encounter for health maintenance examination   [facility] [facility] [name], PA-C   1 year ago Acute otitis externa of right ear, unspecified type   [place], Dionne Bucy, MD   2 years ago Annual physical exam   [facility] [place], Clearnce Sorrel, [place]       Future Appointments             In 3 weeks Ralene Bathe, MD [place]

## 2022-09-18 DIAGNOSIS — M25512 Pain in left shoulder: Secondary | ICD-10-CM | POA: Diagnosis not present

## 2022-09-18 DIAGNOSIS — Z9889 Other specified postprocedural states: Secondary | ICD-10-CM | POA: Diagnosis not present

## 2022-09-23 DIAGNOSIS — Z9889 Other specified postprocedural states: Secondary | ICD-10-CM | POA: Diagnosis not present

## 2022-09-23 DIAGNOSIS — M25612 Stiffness of left shoulder, not elsewhere classified: Secondary | ICD-10-CM | POA: Diagnosis not present

## 2022-10-02 ENCOUNTER — Ambulatory Visit (INDEPENDENT_AMBULATORY_CARE_PROVIDER_SITE_OTHER): Payer: Medicare Other | Admitting: Dermatology

## 2022-10-02 ENCOUNTER — Encounter: Payer: Self-pay | Admitting: Dermatology

## 2022-10-02 VITALS — BP 131/82

## 2022-10-02 DIAGNOSIS — L578 Other skin changes due to chronic exposure to nonionizing radiation: Secondary | ICD-10-CM

## 2022-10-02 DIAGNOSIS — Z1283 Encounter for screening for malignant neoplasm of skin: Secondary | ICD-10-CM | POA: Diagnosis not present

## 2022-10-02 DIAGNOSIS — L814 Other melanin hyperpigmentation: Secondary | ICD-10-CM | POA: Diagnosis not present

## 2022-10-02 DIAGNOSIS — L729 Follicular cyst of the skin and subcutaneous tissue, unspecified: Secondary | ICD-10-CM

## 2022-10-02 DIAGNOSIS — D229 Melanocytic nevi, unspecified: Secondary | ICD-10-CM

## 2022-10-02 DIAGNOSIS — L820 Inflamed seborrheic keratosis: Secondary | ICD-10-CM

## 2022-10-02 DIAGNOSIS — Z86018 Personal history of other benign neoplasm: Secondary | ICD-10-CM

## 2022-10-02 DIAGNOSIS — L720 Epidermal cyst: Secondary | ICD-10-CM

## 2022-10-02 DIAGNOSIS — L570 Actinic keratosis: Secondary | ICD-10-CM | POA: Diagnosis not present

## 2022-10-02 DIAGNOSIS — Z9889 Other specified postprocedural states: Secondary | ICD-10-CM | POA: Diagnosis not present

## 2022-10-02 DIAGNOSIS — L821 Other seborrheic keratosis: Secondary | ICD-10-CM

## 2022-10-02 NOTE — Patient Instructions (Addendum)
Cryotherapy Aftercare  Wash gently with soap and water everyday.   Apply Vaseline and Band-Aid daily until healed.   Start Aklief nightly to white bumps nose and between eyes, tiny amount  Due to recent changes in healthcare laws, you may see results of your pathology and/or laboratory studies on MyChart before the doctors have had a chance to review them. We understand that in some cases there may be results that are confusing or concerning to you. Please understand that not all results are received at the same time and often the doctors may need to interpret multiple results in order to provide you with the best plan of care or course of treatment. Therefore, we ask that you please give [place] 2 business days to thoroughly review all your results before contacting the office for clarification. Should we see a critical lab result, you will be contacted sooner.   If You Need Anything After Your Visit  If you have any questions or concerns for your doctor, please call our main line at [phone] and press option 4 to reach your doctor's medical assistant. If no one answers, please leave a voicemail as directed and we will return your call as soon as possible. Messages left after 4 pm will be answered the following business day.   You may also send [place] a message via MyChart. We typically respond to MyChart messages within 1-2 business days.  For prescription refills, please ask your pharmacy to contact our office. Our fax number is [phone].  If you have an urgent issue when the clinic is closed that cannot wait until the next business day, you can page your doctor at the number below.    Please note that while we do our best to be available for urgent issues outside of office hours, we are not available 24/7.   If you have an urgent issue and are unable to reach [place], you may choose to seek medical care at your doctor's office, retail clinic, urgent care center, or emergency room.  If you have  a medical emergency, please immediately call 911 or go to the emergency department.  Pager Numbers  - Dr. Gwen Pounds: [phone]  - Dr. Neale Burly: [phone]  - Dr. Roseanne Reno: [phone]  In the event of inclement weather, please call our main line at [phone] for an update on the status of any delays or closures.  Dermatology Medication Tips: Please keep the boxes that topical medications come in in order to help keep track of the instructions about where and how to use these. Pharmacies typically print the medication instructions only on the boxes and not directly on the medication tubes.   If your medication is too expensive, please contact our office at [phone] option 4 or send [place] a message through MyChart.   We are unable to tell what your co-pay for medications will be in advance as this is different depending on your insurance coverage. However, we may be able to find a substitute medication at lower cost or fill out paperwork to get insurance to cover a needed medication.   If a prior authorization is required to get your medication covered by your insurance company, please allow [place] 1-2 business days to complete this process.  Drug prices often vary depending on where the prescription is filled and some pharmacies may offer cheaper prices.  The website www.goodrx.com contains coupons for medications through different pharmacies. The prices here do not account for what the cost may be with help from insurance (  it may be cheaper with your insurance), but the website can give you the price if you did not use any insurance.  - You can print the associated coupon and take it with your prescription to the pharmacy.  - You may also stop by our office during regular business hours and pick up a GoodRx coupon card.  - If you need your prescription sent electronically to a different pharmacy, notify our office through [facility] or by phone at [phone] option 4.     Si  Usted Necesita Algo Despus de Su Visita  Tambin puede enviarnos un mensaje a travs de Pharmacist, community. Por lo general respondemos a los mensajes de MyChart en el transcurso de 1 a 2 das hbiles.  Para renovar recetas, por favor pida a su farmacia que se ponga en contacto con nuestra oficina. Harland Dingwall de fax es [place] [phone].  Si tiene un asunto urgente cuando la clnica est cerrada y que no puede esperar hasta el siguiente da hbil, puede llamar/localizar a su doctor(a) al nmero que aparece a continuacin.   Por favor, tenga en cuenta que aunque hacemos todo lo posible para estar disponibles para asuntos urgentes fuera del horario de [place], no estamos disponibles las 24 horas del da, los 7 das de la [place].   Si tiene un problema urgente y no puede comunicarse con nosotros, puede optar por buscar atencin mdica  en el consultorio de su doctor(a), en una clnica privada, en un centro de atencin urgente o en una sala de emergencias.  Si tiene Engineering geologist, por favor llame inmediatamente al 911 o vaya a la sala de emergencias.  Nmeros de bper  - Dr. Nehemiah Massed: [phone]  - Dra. Moye: [phone]  - Dra. Nicole Kindred: [phone]  En caso de inclemencias del [place], por favor llame a Johnsie Kindred principal al [phone] para una actualizacin sobre el [place] de cualquier retraso o cierre.  Consejos para la medicacin en dermatologa: Por favor, guarde las cajas en las que vienen los medicamentos de uso tpico para ayudarle a seguir las instrucciones sobre dnde y cmo usarlos. Las farmacias generalmente imprimen las instrucciones del medicamento slo en las cajas y no directamente en los tubos del [place].   Si su medicamento es muy caro, por favor, pngase en contacto con Zigmund Daniel llamando al [phone] y presione la opcin 4 o envenos un mensaje a travs de Pharmacist, community.   No podemos decirle cul ser su copago por los medicamentos por adelantado ya que  esto es diferente dependiendo de la cobertura de su seguro. Sin embargo, es posible que podamos encontrar un medicamento sustituto a Electrical engineer un formulario para que el seguro cubra el medicamento que se considera necesario.   Si se requiere una autorizacin previa para que su compaa de seguros [place] su medicamento, por favor permtanos de 1 a 2 das hbiles para completar este proceso.  Los precios de los medicamentos varan con frecuencia dependiendo del Environmental consultant de dnde se surte la receta y alguna farmacias pueden ofrecer precios ms baratos.  El sitio web www.goodrx.com tiene cupones para medicamentos de Airline pilot. Los precios aqu no tienen en cuenta lo que podra costar con la ayuda del seguro (puede ser ms barato con su seguro), pero el sitio web puede darle el precio si no utiliz Research scientist (physical sciences).  - Puede imprimir el cupn correspondiente y llevarlo con su receta a la farmacia.  - Tambin puede pasar por nuestra oficina durante el horario de atencin  regular y recoger una tarjeta de cupones de GoodRx.  - Si necesita que su receta se enve electrnicamente a una farmacia diferente, informe a nuestra oficina a travs de MyChart de [place] o por telfono llamando al [phone] y presione la opcin 4.

## 2022-10-02 NOTE — Progress Notes (Signed)
Follow-Up Visit   Subjective  Diana Blanchard is a 77 y.o. female who presents for the following: Skin Cancer Screening and Full Body Skin Exam, check spots face, back, itchy, hx of Dysplastic nevi, hx of AKs  The patient presents for Total-Body Skin Exam (TBSE) for skin cancer screening and mole check. The patient has spots, moles and lesions to be evaluated, some may be new or changing and the patient has concerns that these could be cancer.    The following portions of the chart were reviewed this encounter and updated as appropriate: medications, allergies, medical history  Review of Systems:  No other skin or systemic complaints except as noted in HPI or Assessment and Plan.  Objective  Well appearing patient in no apparent distress; mood and affect are within normal limits.  A full examination was performed including scalp, head, eyes, ears, nose, lips, neck, chest, axillae, abdomen, back, buttocks, bilateral upper extremities, bilateral lower extremities, hands, feet, fingers, toes, fingernails, and toenails. All findings within normal limits unless otherwise noted below.   Relevant physical exam findings are noted in the Assessment and Plan.  R temple x 1, L temple x 1, R infrascapular x 1, L shoulder x 2, R shoulder x 1, chest x 9 (15) Stuck on waxy paps with erythema  Chest x 2 (2) Pink scaly macules    Assessment & Plan   LENTIGINES, SEBORRHEIC KERATOSES, HEMANGIOMAS - Benign normal skin lesions - Benign-appearing - Call for any changes  MELANOCYTIC NEVI - Tan-brown and/or pink-flesh-colored symmetric macules and papules - Benign appearing on exam today - Observation - Call clinic for new or changing moles - Recommend daily use of broad spectrum spf 30+ sunscreen to sun-exposed areas.   ACTINIC DAMAGE - Chronic condition, secondary to cumulative UV/sun exposure - diffuse scaly erythematous macules with underlying dyspigmentation - Recommend daily broad spectrum  sunscreen SPF 30+ to sun-exposed areas, reapply every 2 hours as needed.  - Staying in the shade or wearing long sleeves, sun glasses (UVA+UVB protection) and wide brim hats (4-inch brim around the entire circumference of the hat) are also recommended for sun protection.  - Call for new or changing lesions.  SKIN CANCER SCREENING PERFORMED TODAY.  HISTORY OF DYSPLASTIC NEVUS No evidence of recurrence today Recommend regular full body skin exams Recommend daily broad spectrum sunscreen SPF 30+ to sun-exposed areas, reapply every 2 hours as needed.  Call if any new or changing lesions are noted between office visits  - Right lat buttock, right back braline, right back paraspinal med to inf scapula.   Inflamed seborrheic keratosis (15) R temple x 1, L temple x 1, R infrascapular x 1, L shoulder x 2, R shoulder x 1, chest x 9  Symptomatic, irritating, patient would like treated.   Destruction of lesion - R temple x 1, L temple x 1, R infrascapular x 1, L shoulder x 2, R shoulder x 1, chest x 9 Complexity: simple   Destruction method: cryotherapy   Informed consent: discussed and consent obtained   Timeout:  patient name, date of birth, surgical site, and procedure verified Lesion destroyed using liquid nitrogen: Yes   Region frozen until ice ball extended beyond lesion: Yes   Outcome: patient tolerated procedure well with no complications   Post-procedure details: wound care instructions given    AK (actinic keratosis) (2) Chest x 2  Destruction of lesion - Chest x 2 Complexity: simple   Destruction method: cryotherapy   Informed consent: discussed  and consent obtained   Timeout:  patient name, date of birth, surgical site, and procedure verified Lesion destroyed using liquid nitrogen: Yes   Region frozen until ice ball extended beyond lesion: Yes   Outcome: patient tolerated procedure well with no complications   Post-procedure details: wound care instructions given      [place] - tiny firm white papules - type of cyst - benign - may be extracted if symptomatic - observe  - nose, glabella - Start Aklief qhs to nose glabella, Lot [id number] exp 05/25  Return in about 1 year (around 10/02/2023) for Hx of Dysplastic nevi, Hx of AKs.  I, Ardis Rowan, RMA, am acting as scribe for Armida Sans, MD .   Documentation: I have reviewed the above documentation for accuracy and completeness, and I agree with the above.  Armida Sans, MD

## 2022-10-04 ENCOUNTER — Other Ambulatory Visit: Payer: Self-pay | Admitting: Family Medicine

## 2022-10-04 DIAGNOSIS — F331 Major depressive disorder, recurrent, moderate: Secondary | ICD-10-CM

## 2022-10-08 DIAGNOSIS — M25612 Stiffness of left shoulder, not elsewhere classified: Secondary | ICD-10-CM | POA: Diagnosis not present

## 2022-10-08 DIAGNOSIS — Z9889 Other specified postprocedural states: Secondary | ICD-10-CM | POA: Diagnosis not present

## 2022-10-11 DIAGNOSIS — M25512 Pain in left shoulder: Secondary | ICD-10-CM | POA: Diagnosis not present

## 2022-10-11 DIAGNOSIS — Z9889 Other specified postprocedural states: Secondary | ICD-10-CM | POA: Diagnosis not present

## 2022-10-14 ENCOUNTER — Ambulatory Visit
Admission: RE | Admit: 2022-10-14 | Discharge: 2022-10-14 | Disposition: A | Discharge status: Home / Self Care | Payer: Medicare Other | Source: Ambulatory Visit | Attending: Physician Assistant | Admitting: Physician Assistant

## 2022-10-14 ENCOUNTER — Encounter: Payer: Self-pay | Admitting: Dermatology

## 2022-10-14 DIAGNOSIS — Z1231 Encounter for screening mammogram for malignant neoplasm of breast: Secondary | ICD-10-CM | POA: Insufficient documentation

## 2022-10-14 DIAGNOSIS — M8589 Other specified disorders of bone density and structure, multiple sites: Secondary | ICD-10-CM | POA: Insufficient documentation

## 2022-10-14 DIAGNOSIS — M8580 Other specified disorders of bone density and structure, unspecified site: Secondary | ICD-10-CM

## 2022-10-14 DIAGNOSIS — M85851 Other specified disorders of bone density and structure, right thigh: Secondary | ICD-10-CM | POA: Diagnosis not present

## 2022-10-14 DIAGNOSIS — R531 Weakness: Secondary | ICD-10-CM | POA: Diagnosis not present

## 2022-10-14 DIAGNOSIS — M25612 Stiffness of left shoulder, not elsewhere classified: Secondary | ICD-10-CM | POA: Diagnosis not present

## 2022-10-14 DIAGNOSIS — Z780 Asymptomatic menopausal state: Secondary | ICD-10-CM | POA: Diagnosis not present

## 2022-10-14 DIAGNOSIS — Z9889 Other specified postprocedural states: Secondary | ICD-10-CM | POA: Diagnosis not present

## 2022-10-14 DIAGNOSIS — M25512 Pain in left shoulder: Secondary | ICD-10-CM | POA: Diagnosis not present

## 2022-10-16 ENCOUNTER — Telehealth: Payer: Self-pay

## 2022-10-16 NOTE — Telephone Encounter (Signed)
Copied from CRM [id number]. Topic: General - Other >> Oct 16, 2022  1:21 PM [place] B wrote: Reason for CRM:  pt called in trying to make sure she is up to date on shingles shot.

## 2022-10-16 NOTE — Telephone Encounter (Signed)
Patient advised she is up to date on her shingles vaccine. Patient verbalized understanding

## 2022-10-17 DIAGNOSIS — Z9889 Other specified postprocedural states: Secondary | ICD-10-CM | POA: Diagnosis not present

## 2022-10-17 DIAGNOSIS — M25512 Pain in left shoulder: Secondary | ICD-10-CM | POA: Diagnosis not present

## 2022-10-21 DIAGNOSIS — M25512 Pain in left shoulder: Secondary | ICD-10-CM | POA: Diagnosis not present

## 2022-10-21 DIAGNOSIS — Z9889 Other specified postprocedural states: Secondary | ICD-10-CM | POA: Diagnosis not present

## 2022-10-25 DIAGNOSIS — Z9889 Other specified postprocedural states: Secondary | ICD-10-CM | POA: Diagnosis not present

## 2022-10-27 ENCOUNTER — Other Ambulatory Visit: Payer: Self-pay | Admitting: Family Medicine

## 2022-10-27 DIAGNOSIS — F331 Major depressive disorder, recurrent, moderate: Secondary | ICD-10-CM

## 2022-10-28 DIAGNOSIS — Z9889 Other specified postprocedural states: Secondary | ICD-10-CM | POA: Diagnosis not present

## 2022-10-28 DIAGNOSIS — M25512 Pain in left shoulder: Secondary | ICD-10-CM | POA: Diagnosis not present

## 2022-10-28 NOTE — Telephone Encounter (Signed)
Please advise 

## 2022-10-28 NOTE — Telephone Encounter (Signed)
Correction re-routed to [place], PA

## 2022-11-05 DIAGNOSIS — M25612 Stiffness of left shoulder, not elsewhere classified: Secondary | ICD-10-CM | POA: Diagnosis not present

## 2022-11-05 DIAGNOSIS — R531 Weakness: Secondary | ICD-10-CM | POA: Diagnosis not present

## 2022-11-05 DIAGNOSIS — M25512 Pain in left shoulder: Secondary | ICD-10-CM | POA: Diagnosis not present

## 2022-11-05 DIAGNOSIS — Z9889 Other specified postprocedural states: Secondary | ICD-10-CM | POA: Diagnosis not present

## 2022-11-07 DIAGNOSIS — Z9889 Other specified postprocedural states: Secondary | ICD-10-CM | POA: Diagnosis not present

## 2022-11-07 DIAGNOSIS — R531 Weakness: Secondary | ICD-10-CM | POA: Diagnosis not present

## 2022-11-07 DIAGNOSIS — M25612 Stiffness of left shoulder, not elsewhere classified: Secondary | ICD-10-CM | POA: Diagnosis not present

## 2022-11-07 DIAGNOSIS — M25512 Pain in left shoulder: Secondary | ICD-10-CM | POA: Diagnosis not present

## 2022-11-12 DIAGNOSIS — M25612 Stiffness of left shoulder, not elsewhere classified: Secondary | ICD-10-CM | POA: Diagnosis not present

## 2022-11-12 DIAGNOSIS — R531 Weakness: Secondary | ICD-10-CM | POA: Diagnosis not present

## 2022-11-12 DIAGNOSIS — Z9889 Other specified postprocedural states: Secondary | ICD-10-CM | POA: Diagnosis not present

## 2022-11-12 DIAGNOSIS — M25512 Pain in left shoulder: Secondary | ICD-10-CM | POA: Diagnosis not present

## 2022-11-14 DIAGNOSIS — Z9889 Other specified postprocedural states: Secondary | ICD-10-CM | POA: Diagnosis not present

## 2022-11-14 DIAGNOSIS — M25512 Pain in left shoulder: Secondary | ICD-10-CM | POA: Diagnosis not present

## 2022-11-14 DIAGNOSIS — R531 Weakness: Secondary | ICD-10-CM | POA: Diagnosis not present

## 2022-11-14 DIAGNOSIS — M25612 Stiffness of left shoulder, not elsewhere classified: Secondary | ICD-10-CM | POA: Diagnosis not present

## 2022-11-19 DIAGNOSIS — M25612 Stiffness of left shoulder, not elsewhere classified: Secondary | ICD-10-CM | POA: Diagnosis not present

## 2022-11-19 DIAGNOSIS — Z9889 Other specified postprocedural states: Secondary | ICD-10-CM | POA: Diagnosis not present

## 2022-11-19 DIAGNOSIS — M25512 Pain in left shoulder: Secondary | ICD-10-CM | POA: Diagnosis not present

## 2022-11-19 DIAGNOSIS — R531 Weakness: Secondary | ICD-10-CM | POA: Diagnosis not present

## 2022-11-21 DIAGNOSIS — M25512 Pain in left shoulder: Secondary | ICD-10-CM | POA: Diagnosis not present

## 2022-11-21 DIAGNOSIS — Z9889 Other specified postprocedural states: Secondary | ICD-10-CM | POA: Diagnosis not present

## 2022-11-21 DIAGNOSIS — M25612 Stiffness of left shoulder, not elsewhere classified: Secondary | ICD-10-CM | POA: Diagnosis not present

## 2022-11-21 DIAGNOSIS — R531 Weakness: Secondary | ICD-10-CM | POA: Diagnosis not present

## 2022-11-25 DIAGNOSIS — M25512 Pain in left shoulder: Secondary | ICD-10-CM | POA: Diagnosis not present

## 2022-11-25 DIAGNOSIS — M25612 Stiffness of left shoulder, not elsewhere classified: Secondary | ICD-10-CM | POA: Diagnosis not present

## 2022-11-25 DIAGNOSIS — Z9889 Other specified postprocedural states: Secondary | ICD-10-CM | POA: Diagnosis not present

## 2022-11-25 DIAGNOSIS — R531 Weakness: Secondary | ICD-10-CM | POA: Diagnosis not present

## 2022-11-27 DIAGNOSIS — M75122 Complete rotator cuff tear or rupture of left shoulder, not specified as traumatic: Secondary | ICD-10-CM | POA: Diagnosis not present

## 2022-11-27 DIAGNOSIS — M25512 Pain in left shoulder: Secondary | ICD-10-CM | POA: Diagnosis not present

## 2022-11-27 DIAGNOSIS — Z9889 Other specified postprocedural states: Secondary | ICD-10-CM | POA: Diagnosis not present

## 2022-12-02 DIAGNOSIS — Z9889 Other specified postprocedural states: Secondary | ICD-10-CM | POA: Diagnosis not present

## 2022-12-02 DIAGNOSIS — M25512 Pain in left shoulder: Secondary | ICD-10-CM | POA: Diagnosis not present

## 2022-12-02 DIAGNOSIS — M25612 Stiffness of left shoulder, not elsewhere classified: Secondary | ICD-10-CM | POA: Diagnosis not present

## 2022-12-02 DIAGNOSIS — R531 Weakness: Secondary | ICD-10-CM | POA: Diagnosis not present

## 2022-12-04 DIAGNOSIS — M25512 Pain in left shoulder: Secondary | ICD-10-CM | POA: Diagnosis not present

## 2022-12-04 DIAGNOSIS — M25612 Stiffness of left shoulder, not elsewhere classified: Secondary | ICD-10-CM | POA: Diagnosis not present

## 2022-12-04 DIAGNOSIS — Z9889 Other specified postprocedural states: Secondary | ICD-10-CM | POA: Diagnosis not present

## 2022-12-04 DIAGNOSIS — R531 Weakness: Secondary | ICD-10-CM | POA: Diagnosis not present

## 2022-12-11 DIAGNOSIS — Z9889 Other specified postprocedural states: Secondary | ICD-10-CM | POA: Diagnosis not present

## 2022-12-11 DIAGNOSIS — M25612 Stiffness of left shoulder, not elsewhere classified: Secondary | ICD-10-CM | POA: Diagnosis not present

## 2022-12-11 DIAGNOSIS — R531 Weakness: Secondary | ICD-10-CM | POA: Diagnosis not present

## 2022-12-11 DIAGNOSIS — M25512 Pain in left shoulder: Secondary | ICD-10-CM | POA: Diagnosis not present

## 2022-12-13 DIAGNOSIS — F3342 Major depressive disorder, recurrent, in full remission: Secondary | ICD-10-CM | POA: Diagnosis not present

## 2022-12-16 DIAGNOSIS — Z9889 Other specified postprocedural states: Secondary | ICD-10-CM | POA: Diagnosis not present

## 2022-12-16 DIAGNOSIS — M25612 Stiffness of left shoulder, not elsewhere classified: Secondary | ICD-10-CM | POA: Diagnosis not present

## 2022-12-16 DIAGNOSIS — R531 Weakness: Secondary | ICD-10-CM | POA: Diagnosis not present

## 2022-12-16 DIAGNOSIS — M25512 Pain in left shoulder: Secondary | ICD-10-CM | POA: Diagnosis not present

## 2022-12-18 DIAGNOSIS — R531 Weakness: Secondary | ICD-10-CM | POA: Diagnosis not present

## 2022-12-18 DIAGNOSIS — M25612 Stiffness of left shoulder, not elsewhere classified: Secondary | ICD-10-CM | POA: Diagnosis not present

## 2022-12-18 DIAGNOSIS — Z9889 Other specified postprocedural states: Secondary | ICD-10-CM | POA: Diagnosis not present

## 2022-12-18 DIAGNOSIS — M25512 Pain in left shoulder: Secondary | ICD-10-CM | POA: Diagnosis not present

## 2022-12-25 DIAGNOSIS — H26493 Other secondary cataract, bilateral: Secondary | ICD-10-CM | POA: Diagnosis not present

## 2022-12-25 DIAGNOSIS — Z0100 Encounter for examination of eyes and vision without abnormal findings: Secondary | ICD-10-CM | POA: Diagnosis not present

## 2022-12-27 DIAGNOSIS — Z9889 Other specified postprocedural states: Secondary | ICD-10-CM | POA: Diagnosis not present

## 2022-12-27 DIAGNOSIS — M25612 Stiffness of left shoulder, not elsewhere classified: Secondary | ICD-10-CM | POA: Diagnosis not present

## 2022-12-27 DIAGNOSIS — M25512 Pain in left shoulder: Secondary | ICD-10-CM | POA: Diagnosis not present

## 2022-12-27 DIAGNOSIS — R531 Weakness: Secondary | ICD-10-CM | POA: Diagnosis not present

## 2023-01-21 DIAGNOSIS — G8929 Other chronic pain: Secondary | ICD-10-CM | POA: Diagnosis not present

## 2023-01-21 DIAGNOSIS — M25512 Pain in left shoulder: Secondary | ICD-10-CM | POA: Diagnosis not present

## 2023-01-21 DIAGNOSIS — Z9889 Other specified postprocedural states: Secondary | ICD-10-CM | POA: Diagnosis not present

## 2023-01-21 DIAGNOSIS — R531 Weakness: Secondary | ICD-10-CM | POA: Diagnosis not present

## 2023-02-26 DIAGNOSIS — M75122 Complete rotator cuff tear or rupture of left shoulder, not specified as traumatic: Secondary | ICD-10-CM | POA: Diagnosis not present

## 2023-03-04 ENCOUNTER — Other Ambulatory Visit: Payer: Self-pay | Admitting: Physician Assistant

## 2023-03-04 DIAGNOSIS — B009 Herpesviral infection, unspecified: Secondary | ICD-10-CM

## 2023-03-05 ENCOUNTER — Ambulatory Visit (INDEPENDENT_AMBULATORY_CARE_PROVIDER_SITE_OTHER): Payer: Medicare Other | Admitting: Physician Assistant

## 2023-03-05 ENCOUNTER — Encounter: Payer: Self-pay | Admitting: Physician Assistant

## 2023-03-05 VITALS — BP 118/88 | HR 72 | Ht 66.0 in | Wt 149.0 lb

## 2023-03-05 DIAGNOSIS — E7800 Pure hypercholesterolemia, unspecified: Secondary | ICD-10-CM | POA: Diagnosis not present

## 2023-03-05 DIAGNOSIS — G5600 Carpal tunnel syndrome, unspecified upper limb: Secondary | ICD-10-CM

## 2023-03-05 DIAGNOSIS — B009 Herpesviral infection, unspecified: Secondary | ICD-10-CM | POA: Diagnosis not present

## 2023-03-05 DIAGNOSIS — R7303 Prediabetes: Secondary | ICD-10-CM | POA: Diagnosis not present

## 2023-03-05 DIAGNOSIS — F331 Major depressive disorder, recurrent, moderate: Secondary | ICD-10-CM

## 2023-03-05 DIAGNOSIS — R030 Elevated blood-pressure reading, without diagnosis of hypertension: Secondary | ICD-10-CM

## 2023-03-05 MED ORDER — VALACYCLOVIR HCL 500 MG PO TABS: ORAL_TABLET | ORAL | 5 refills | Status: DC

## 2023-03-05 NOTE — Progress Notes (Signed)
Established patient visit  Patient: Diana Blanchard   DOB: 10/24/1945   77 y.o. Female  MRN: [id number] Visit Date: 03/05/2023  Today's healthcare provider: Debera Lat, PA-C   Chief Complaint  Patient presents with   Medication Refill    Valtrex    Subjective     Discussed the use of AI scribe software for clinical note transcription with the patient, who gave verbal consent to proceed.  History of Present Illness   The patient, with a history of elevated cholesterol, osteopenia, and depression managed with Wellbutrin and Lexapro, presents for a medication refill and reports increased stress. She is currently caring for her 74 year old mother who is bedridden and under hospice care, her husband who recently had back surgery, and she herself had shoulder surgery in March. 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 She has previously seen a psychiatrist for an evaluation, but no further action was taken as she was deemed to be okay. She expresses that she is too busy to seek further psychiatric help at this time.  In addition to her stress, she reports discomfort in her ear, describing it as feeling like there is water in it and it is a little sore. She denies any nasal congestion or drainage. She also reports occasional numbness in her hand due to carpal tunnel syndrome.           03/05/2023    1:20 PM 07/26/2022   10:16 AM 02/07/2022    2:24 PM  Depression screen PHQ 2/9  Decreased Interest 1 0 0  Down, Depressed, Hopeless 1 1 1   PHQ - 2 Score 2 1 1   Altered sleeping 0 0 0  Tired, decreased energy 0 0 1  Change in appetite 0 0 0  Feeling bad or failure about yourself  0 0 0  Trouble concentrating 0 0 0  Moving slowly or fidgety/restless 0 0 0  Suicidal thoughts 0 0 0  PHQ-9 Score 2 1 2   Difficult doing work/chores Not difficult at all Not difficult at all Not difficult at all      03/05/2023    1:19 PM 01/13/2020    5:56 PM  GAD 7 :  Generalized Anxiety Score  Nervous, Anxious, on Edge 1 1  Control/stop worrying 1 1  Worry too much - different things 1 1  Trouble relaxing 1 0  Restless 0 0  Easily annoyed or irritable 0 1  Afraid - awful might happen 0 0  Total GAD 7 Score 4 4  Anxiety Difficulty  Somewhat difficult    Medications: Outpatient Medications Prior to Visit  Medication Sig   acetaminophen (TYLENOL) 500 MG tablet Take 2 tablets (1,000 mg total) by mouth every 8 (eight) hours.   buPROPion (WELLBUTRIN XL) 150 MG 24 hr tablet TAKE 1 TABLET(150 MG) BY MOUTH DAILY   escitalopram (LEXAPRO) 20 MG tablet Take 1 tablet (20 mg total) by mouth daily.   Multiple Vitamins-Minerals (WOMENS 50+ MULTI VITAMIN/MIN) TABS Take by mouth daily.   simvastatin (ZOCOR) 40 MG tablet TAKE 1 TABLET BY MOUTH DAILY   [DISCONTINUED] valACYclovir (VALTREX) 500 MG tablet TAKE 1 TABLET(500 MG) BY MOUTH TWICE DAILY AS NEEDED   [DISCONTINUED] ondansetron (ZOFRAN-ODT) 4 MG disintegrating tablet Take 1 tablet (4 mg total) by mouth every 8 (eight) hours as needed for nausea or vomiting.   [DISCONTINUED] oxyCODONE (ROXICODONE) 5 MG immediate release tablet Take 1-2 tablets (5-10 mg total) by mouth every 4 (four) hours  as needed (pain).   No facility-administered medications prior to visit.    Review of Systems  All other systems reviewed and are negative.  Except see HPI       Objective    BP 118/88 (BP Location: Left Arm, Patient Position: Sitting, Cuff Size: Large)   Pulse 72   Ht 5\' 6"  (1.676 m)   Wt 149 lb (67.6 kg)   SpO2 100%   BMI 24.05 kg/m     Physical Exam Vitals reviewed.  Constitutional:      General: She is not in acute distress.    Appearance: Normal appearance. She is well-developed. She is not diaphoretic.  HENT:     Head: Normocephalic and atraumatic.  Eyes:     General: No scleral icterus.    Conjunctiva/sclera: Conjunctivae normal.  Neck:     Thyroid: No thyromegaly.  Cardiovascular:     Rate and  Rhythm: Normal rate and regular rhythm.     Pulses: Normal pulses.     Heart sounds: Normal heart sounds. No murmur heard. Pulmonary:     Effort: Pulmonary effort is normal. No respiratory distress.     Breath sounds: Normal breath sounds. No wheezing, rhonchi or rales.  Musculoskeletal:     Cervical back: Neck supple.     Right lower leg: No edema.     Left lower leg: No edema.  Lymphadenopathy:     Cervical: No cervical adenopathy.  Skin:    General: Skin is warm and dry.     Findings: No rash.  Neurological:     Mental Status: She is alert and oriented to person, place, and time. Mental status is at baseline.  Psychiatric:        Mood and Affect: Mood normal.        Behavior: Behavior normal.      No results found for any visits on 03/05/23.  Assessment & Plan     Prediabetes Chronic   Last  A1c 5.9 from 7 mo ago, indicating prediabetic range. Discussed importance of low carb diet, daily exercise, and portion control. -Monitor blood glucose levels and maintain low carb diet and regular exercise. - CBC with Differential/Platelet - Comprehensive metabolic panel - Hemoglobin A1c - Lipid panel Will reassess after  receiving lab results Will FU  Hypercholesterolemia Chronic  Last LDL 103  7 mo ago Currently managed with medication. Zocor 40mg . No changes reported. -Continue current lipid-lowering medication as prescribed. - CBC with Differential/Platelet - Comprehensive metabolic panel - Hemoglobin A1c - Lipid panel Continue low cholesterol diet .Will FU  Herpes simplex type 2 infection Recurrent. Has been taking vatrex since 06/20/20 when she has been having flare ups. - valACYclovir (VALTREX) 500 MG tablet; TAKE 1 TABLET(500 MG) BY MOUTH TWICE DAILY AS NEEDED  Dispense: 30 tablet; Refill: 5 Denies side effects. Will monitor  Elevated Stress Levels High stress due to personal circumstances. Discussed potential benefits of counseling and caregiver resources.  Patient declined at this time due to current commitments. -Consider counseling or caregiver resources in the future if stress levels remain high.  Depression    03/05/2023    1:20 PM 07/26/2022   10:16 AM 02/07/2022    2:24 PM  PHQ9 SCORE ONLY  PHQ-9 Total Score 2 1 2       03/05/2023    1:19 PM 01/13/2020    5:56 PM  GAD 7 : Generalized Anxiety Score  Nervous, Anxious, on Edge 1 1  Control/stop worrying 1 1  Worry too  much - different things 1 1  Trouble relaxing 1 0  Restless 0 0  Easily annoyed or irritable 0 1  Afraid - awful might happen 0 0  Total GAD 7 Score 4 4  Anxiety Difficulty  Somewhat difficult    Currently managed with Wellbutrin and Lexapro. No changes in mood or medication side effects reported. -Continue Wellbutrin 150mg  and Lexapro 20mg  as prescribed. Will FU  Ear Discomfort/right Patient reports ear discomfort, but no clear signs of infection or fluid build-up. No nasal congestion or drainage reported. -Monitor symptoms and contact office if symptoms worsen.  Carpal Tunnel Syndrome Reports occasional numbness in hand. Patient manages symptoms by shaking hand. -Consider contrast baths for symptom management and braces at night if needed.  General Health Maintenance -Continue taking multivitamin. -Consider adding calcium and vitamin D supplements. due to osteopenia. -Check blood pressure periodically and report if consistently above 140/90. -Complete blood work tomorrow morning. -Schedule physical for next year. Follow-up sooner if lab results are concerning or if new symptoms arise.      Return for CPE for february of 2025 with me , please.     The patient was advised to call back or seek an in-person evaluation if the symptoms worsen or if the condition fails to improve as anticipated.  I discussed the assessment and treatment plan with the patient. The patient was provided an opportunity to ask questions and all were answered. The patient agreed with  the plan and demonstrated an understanding of the instructions.  I, Debera Lat, PA-C have reviewed all documentation for this visit. The documentation on  03/05/23 for the exam, diagnosis, procedures, and orders are all accurate and complete.  Debera Lat, [facility], MMS [facility] [phone] (phone) [phone] (fax)  [facility]

## 2023-03-06 DIAGNOSIS — R7303 Prediabetes: Secondary | ICD-10-CM | POA: Diagnosis not present

## 2023-03-06 DIAGNOSIS — R030 Elevated blood-pressure reading, without diagnosis of hypertension: Secondary | ICD-10-CM | POA: Diagnosis not present

## 2023-03-06 DIAGNOSIS — E7800 Pure hypercholesterolemia, unspecified: Secondary | ICD-10-CM | POA: Diagnosis not present

## 2023-03-07 LAB — COMPREHENSIVE METABOLIC PANEL
ALT: 6 IU/L (ref 0–32)
AST: 14 IU/L (ref 0–40)
Albumin: 4.4 g/dL (ref 3.8–4.8)
Alkaline Phosphatase: 84 IU/L (ref 44–121)
BUN/Creatinine Ratio: 17 (ref 12–28)
BUN: 18 mg/dL (ref 8–27)
Bilirubin Total: 0.6 mg/dL (ref 0.0–1.2)
CO2: 22 mmol/L (ref 20–29)
Calcium: 9.6 mg/dL (ref 8.7–10.3)
Chloride: 106 mmol/L (ref 96–106)
Creatinine, Ser: 1.09 mg/dL — ABNORMAL HIGH (ref 0.57–1.00)
Globulin, Total: 2.1 g/dL (ref 1.5–4.5)
Glucose: 91 mg/dL (ref 70–99)
Potassium: 4.9 mmol/L (ref 3.5–5.2)
Sodium: 142 mmol/L (ref 134–144)
Total Protein: 6.5 g/dL (ref 6.0–8.5)
eGFR: 53 mL/min/{1.73_m2} — ABNORMAL LOW (ref >59)

## 2023-03-07 LAB — CBC WITH DIFFERENTIAL/PLATELET
Basophils Absolute: 0.1 10*3/uL (ref 0.0–0.2)
Basos: 1 %
EOS (ABSOLUTE): 0.2 10*3/uL (ref 0.0–0.4)
Eos: 4 %
Hematocrit: 39.2 % (ref 34.0–46.6)
Hemoglobin: 12.8 g/dL (ref 11.1–15.9)
Immature Grans (Abs): 0 10*3/uL (ref 0.0–0.1)
Immature Granulocytes: 0 %
Lymphocytes Absolute: 1.6 10*3/uL (ref 0.7–3.1)
Lymphs: 30 %
MCH: 32.7 pg (ref 26.6–33.0)
MCHC: 32.7 g/dL (ref 31.5–35.7)
MCV: 100 fL — ABNORMAL HIGH (ref 79–97)
Monocytes Absolute: 0.7 10*3/uL (ref 0.1–0.9)
Monocytes: 12 %
Neutrophils Absolute: 2.9 10*3/uL (ref 1.4–7.0)
Neutrophils: 53 %
Platelets: 292 10*3/uL (ref 150–450)
RBC: 3.91 x10E6/uL (ref 3.77–5.28)
RDW: 12.3 % (ref 11.7–15.4)
WBC: 5.6 10*3/uL (ref 3.4–10.8)

## 2023-03-07 LAB — HEMOGLOBIN A1C
Est. average glucose Bld gHb Est-mCnc: 123 mg/dL
Hgb A1c MFr Bld: 5.9 % — ABNORMAL HIGH (ref 4.8–5.6)

## 2023-03-07 LAB — LIPID PANEL
Chol/HDL Ratio: 2.5 ratio (ref 0.0–4.4)
Cholesterol, Total: 197 mg/dL (ref 100–199)
HDL: 78 mg/dL (ref >39)
LDL Chol Calc (NIH): 108 mg/dL — ABNORMAL HIGH (ref 0–99)
Triglycerides: 61 mg/dL (ref 0–149)
VLDL Cholesterol Cal: 11 mg/dL (ref 5–40)

## 2023-03-13 ENCOUNTER — Ambulatory Visit: Payer: Self-pay

## 2023-03-13 ENCOUNTER — Telehealth: Payer: Medicare Other | Admitting: Physician Assistant

## 2023-03-13 ENCOUNTER — Other Ambulatory Visit: Payer: Self-pay | Admitting: Physician Assistant

## 2023-03-13 DIAGNOSIS — F331 Major depressive disorder, recurrent, moderate: Secondary | ICD-10-CM

## 2023-03-13 DIAGNOSIS — U071 COVID-19: Secondary | ICD-10-CM | POA: Diagnosis not present

## 2023-03-13 MED ORDER — BENZONATATE 100 MG PO CAPS
100.0000 mg | ORAL_CAPSULE | Freq: Three times a day (TID) | ORAL | 0 refills | Status: DC | PRN
Start: 2023-03-13 — End: 2023-08-12

## 2023-03-13 MED ORDER — NIRMATRELVIR/RITONAVIR (PAXLOVID) TABLET (RENAL DOSING)
2.0000 | ORAL_TABLET | Freq: Two times a day (BID) | ORAL | 0 refills | Status: AC
Start: 2023-03-13 — End: 2023-03-18

## 2023-03-13 NOTE — Patient Instructions (Addendum)
Diana Blanchard, thank you for joining Piedad Climes, PA-C for today's virtual visit.  While this provider is not your primary care provider (PCP), if your PCP is located in our provider database this encounter information will be shared with them immediately following your visit.   A [facility] MyChart account gives you access to today's visit and all your visits, tests, and labs performed at [facility] " click here if you don't have a [facility] MyChart account or go to [email or web address]  Consent: (Patient) Diana Blanchard provided verbal consent for this virtual visit at the beginning of the encounter.  Current Medications:  Current Outpatient Medications:    acetaminophen (TYLENOL) 500 MG tablet, Take 2 tablets (1,000 mg total) by mouth every 8 (eight) hours., Disp: 90 tablet, Rfl: 2   buPROPion (WELLBUTRIN XL) 150 MG 24 hr tablet, TAKE 1 TABLET(150 MG) BY MOUTH DAILY, Disp: 90 tablet, Rfl: 1   escitalopram (LEXAPRO) 20 MG tablet, Take 1 tablet (20 mg total) by mouth daily., Disp: 90 tablet, Rfl: 1   Multiple Vitamins-Minerals (WOMENS 50+ MULTI VITAMIN/MIN) TABS, Take by mouth daily., Disp: , Rfl:    simvastatin (ZOCOR) 40 MG tablet, TAKE 1 TABLET BY MOUTH DAILY, Disp: 90 tablet, Rfl: 3   valACYclovir (VALTREX) 500 MG tablet, TAKE 1 TABLET(500 MG) BY MOUTH TWICE DAILY AS NEEDED, Disp: 30 tablet, Rfl: 5   Medications ordered in this encounter:  No orders of the defined types were placed in this encounter.    *If you need refills on other medications prior to your next appointment, please contact your pharmacy*  Follow-Up: Call back or seek an in-person evaluation if the symptoms worsen or if the condition fails to improve as anticipated.  [facility] [phone]  Care Instructions: Please keep well-hydrated and get plenty of rest. Start a saline nasal rinse to flush out your nasal passages. You can use plain Mucinex to help thin  congestion. Hold of on taking your simvastatin while taking the Paxlovid and for 5 additional days as discussed. If you have a humidifier, running in the bedroom at night. I want you to start OTC vitamin D3 1000 units daily, vitamin C 1000 mg daily, and a zinc supplement. Please take prescribed medications as directed.    Isolation Instructions: You are to isolate at home until you have been fever free for at least 24 hours without a fever-reducing medication, and symptoms have been steadily improving for 24 hours. At that time,  you can end isolation but need to mask for an additional 5 days.   If you must be around other household members who do not have symptoms, you need to make sure that both you and the family members are masking consistently with a high-quality mask.  If you note any worsening of symptoms despite treatment, please seek an in-person evaluation ASAP. If you note any significant shortness of breath or any chest pain, please seek ER evaluation. Please do not delay care!   COVID-19: What to Do if You Are Sick If you test positive and are an older adult or someone who is at high risk of getting very sick from COVID-19, treatment may be available. Contact a healthcare provider right away after a positive test to determine if you are eligible, even if your symptoms are mild right now. You can also visit a Test to Treat location and, if eligible, receive a prescription from a provider. Don't delay: Treatment must be started within the first few  days to be effective. If you have a fever, cough, or other symptoms, you might have COVID-19. Most people have mild illness and are able to recover at home. If you are sick: Keep track of your symptoms. If you have an emergency warning sign (including trouble breathing), call 911. Steps to help prevent the spread of COVID-19 if you are sick If you are sick with COVID-19 or think you might have COVID-19, follow the steps below to care for  yourself and to help protect other people in your home and community. Stay home except to get medical care Stay home. Most people with COVID-19 have mild illness and can recover at home without medical care. Do not leave your home, except to get medical care. Do not visit public areas and do not go to places where you are unable to wear a mask. Take care of yourself. Get rest and stay hydrated. Take over-the-counter medicines, such as acetaminophen, to help you feel better. Stay in touch with your doctor. Call before you get medical care. Be sure to get care if you have trouble breathing, or have any other emergency warning signs, or if you think it is an emergency. Avoid public transportation, ride-sharing, or taxis if possible. Get tested If you have symptoms of COVID-19, get tested. While waiting for test results, stay away from others, including staying apart from those living in your household. Get tested as soon as possible after your symptoms start. Treatments may be available for people with COVID-19 who are at risk for becoming very sick. Don't delay: Treatment must be started early to be effective--some treatments must begin within 5 days of your first symptoms. Contact your healthcare provider right away if your test result is positive to determine if you are eligible. Self-tests are one of several options for testing for the virus that causes COVID-19 and may be more convenient than laboratory-based tests and point-of-care tests. Ask your healthcare provider or your local health department if you need help interpreting your test results. You can visit your state, tribal, local, and territorial health department's website to look for the latest local information on testing sites. Separate yourself from other people As much as possible, stay in a specific room and away from other people and pets in your home. If possible, you should use a separate bathroom. If you need to be around other people  or animals in or outside of the home, wear a well-fitting mask. Tell your close contacts that they may have been exposed to COVID-19. An infected person can spread COVID-19 starting 48 hours (or 2 days) before the person has any symptoms or tests positive. By letting your close contacts know they may have been exposed to COVID-19, you are helping to protect everyone. See COVID-19 and Animals if you have questions about pets. If you are diagnosed with COVID-19, someone from the health department may call you. Answer the call to slow the spread. Monitor your symptoms Symptoms of COVID-19 include fever, cough, or other symptoms. Follow care instructions from your healthcare provider and local health department. Your local health authorities may give instructions on checking your symptoms and reporting information. When to seek emergency medical attention Look for emergency warning signs* for COVID-19. If someone is showing any of these signs, seek emergency medical care immediately: Trouble breathing Persistent pain or pressure in the chest New confusion Inability to wake or stay awake Pale, gray, or blue-colored skin, lips, or nail beds, depending on skin tone *This list is  not all possible symptoms. Please call your medical provider for any other symptoms that are severe or concerning to you. Call 911 or call ahead to your local emergency facility: Notify the operator that you are seeking care for someone who has or may have COVID-19. Call ahead before visiting your doctor Call ahead. Many medical visits for routine care are being postponed or done by phone or telemedicine. If you have a medical appointment that cannot be postponed, call your doctor's office, and tell them you have or may have COVID-19. This will help the office protect themselves and other patients. If you are sick, wear a well-fitting mask You should wear a mask if you must be around other people or animals, including pets (even  at home). Wear a mask with the best fit, protection, and comfort for you. You don't need to wear the mask if you are alone. If you can't put on a mask (because of trouble breathing, for example), cover your coughs and sneezes in some other way. Try to stay at least 6 feet away from other people. This will help protect the people around you. Masks should not be placed on young children under age 61 years, anyone who has trouble breathing, or anyone who is not able to remove the mask without help. Cover your coughs and sneezes Cover your mouth and nose with a tissue when you cough or sneeze. Throw away used tissues in a lined trash can. Immediately wash your hands with soap and water for at least 20 seconds. If soap and water are not available, clean your hands with an alcohol-based hand sanitizer that contains at least 60% alcohol. Clean your hands often Wash your hands often with soap and water for at least 20 seconds. This is especially important after blowing your nose, coughing, or sneezing; going to the bathroom; and before eating or preparing food. Use hand sanitizer if soap and water are not available. Use an alcohol-based hand sanitizer with at least 60% alcohol, covering all surfaces of your hands and rubbing them together until they feel dry. Soap and water are the best option, especially if hands are visibly dirty. Avoid touching your eyes, nose, and mouth with unwashed hands. Handwashing Tips Avoid sharing personal household items Do not share dishes, drinking glasses, cups, eating utensils, towels, or bedding with other people in your home. Wash these items thoroughly after using them with soap and water or put in the dishwasher. Clean surfaces in your home regularly Clean and disinfect high-touch surfaces (for example, doorknobs, tables, handles, light switches, and countertops) in your "sick room" and bathroom. In shared spaces, you should clean and disinfect surfaces and items after  each use by the person who is ill. If you are sick and cannot clean, a caregiver or other person should only clean and disinfect the area around you (such as your bedroom and bathroom) on an as needed basis. Your caregiver/other person should wait as long as possible (at least several hours) and wear a mask before entering, cleaning, and disinfecting shared spaces that you use. Clean and disinfect areas that may have blood, stool, or body fluids on them. Use household cleaners and disinfectants. Clean visible dirty surfaces with household cleaners containing soap or detergent. Then, use a household disinfectant. Use a product from [employer] List N: Disinfectants for Coronavirus (COVID-19). Be sure to follow the instructions on the label to ensure safe and effective use of the product. Many products recommend keeping the surface wet with a disinfectant  for a certain period of time (look at "contact time" on the product label). You may also need to wear personal protective equipment, such as gloves, depending on the directions on the product label. Immediately after disinfecting, wash your hands with soap and water for 20 seconds. For completed guidance on cleaning and disinfecting your home, visit Complete Disinfection Guidance. Take steps to improve ventilation at home Improve ventilation (air flow) at home to help prevent from spreading COVID-19 to other people in your household. Clear out COVID-19 virus particles in the air by opening windows, using air filters, and turning on fans in your home. Use this interactive tool to learn how to improve air flow in your home. When you can be around others after being sick with COVID-19 Deciding when you can be around others is different for different situations. Find out when you can safely end home isolation. For any additional questions about your care, contact your healthcare provider or state or local health department. 09/05/2020 Content source: [facility] Diseases ([facility]), Division of Viral Diseases This information is not intended to replace advice given to you by your health care provider. Make sure you discuss any questions you have with your health care provider. Document Revised: 10/19/2020 Document Reviewed: 10/19/2020 Elsevier Patient Education  2022 [employer].   If you have been instructed to have an in-person evaluation today at a local Urgent Care facility, please use the link below. It will take you to a list of all of our available [facility], including address, phone number and hours of operation. Please do not delay care.  [facility]  If you or a family member do not have a primary care provider, use the link below to schedule a visit and establish care. When you choose a [facility] primary care physician or advanced practice provider, you gain a long-term partner in health. Find a Primary Care Provider  Learn more about [facility]'s in-office and virtual care options: [facility] - Get Care Now

## 2023-03-13 NOTE — Progress Notes (Signed)
Virtual Visit Consent   Diana Blanchard, you are scheduled for a virtual visit with a [facility] provider today. Just as with appointments in the office, your consent must be obtained to participate. Your consent will be active for this visit and any virtual visit you may have with one of our providers in the next 365 days. If you have a MyChart account, a copy of this consent can be sent to you electronically.  As this is a virtual visit, video technology does not allow for your provider to perform a traditional examination. This may limit your provider's ability to fully assess your condition. If your provider identifies any concerns that need to be evaluated in person or the need to arrange testing (such as labs, EKG, etc.), we will make arrangements to do so. Although advances in technology are sophisticated, we cannot ensure that it will always work on either your end or our end. If the connection with a video visit is poor, the visit may have to be switched to a telephone visit. With either a video or telephone visit, we are not always able to ensure that we have a secure connection.  By engaging in this virtual visit, you consent to the provision of healthcare and authorize for your insurance to be billed (if applicable) for the services provided during this visit. Depending on your insurance coverage, you may receive a charge related to this service.  I need to obtain your verbal consent now. Are you willing to proceed with your visit today? Jaquasha Gotts has provided verbal consent on 03/13/2023 for a virtual visit (video or telephone). Diana Blanchard, [place]  Date: 03/13/2023 1:53 PM  Virtual Visit via Video Note   I, Diana Blanchard, connected with  Diana Blanchard  ([id number], 1945-08-23) on 03/13/23 at  2:00 PM EDT by a video-enabled telemedicine application and verified that I am speaking with the correct person using two identifiers.  Location: Patient: Virtual Visit Location Patient:  Home Provider: Virtual Visit Location Provider: Home Office   I discussed the limitations of evaluation and management by telemedicine and the availability of in person appointments. The patient expressed understanding and agreed to proceed.    History of Present Illness: Diana Blanchard is a 77 y.o. who identifies as a female who was assigned female at birth, and is being seen today for COVID-19. Patient endorses symptoms starting overnight with aches, fever (Tmax), chills, nasal and head congestion, mild cough. Denies shortness of breath or chest pain. Notes some mild, loose stool and nausea. Took a home COVID test this morning that was positive .  OTC -- Tylenol.  HPI: HPI  Problems:  Patient Active Problem List   Diagnosis Date Noted   Osteopenia 07/26/2022   Elevated blood pressure reading 07/26/2022   Prediabetes 07/26/2022   Arthritis of both hands 07/25/2021   Varicose veins of leg with pain, right 09/01/2020   Moderate episode of recurrent major depressive disorder (HCC) 07/28/2020   History of colonic polyps    Polyp of descending colon    Heart palpitations 06/12/2020   Herpes simplex type 2 infection 05/31/2015   Hypercholesterolemia 05/31/2015   High potassium 05/31/2015   Billowing mitral valve 05/31/2015   Rotator cuff syndrome 05/31/2015   Clinical depression 12/08/2008   Low back pain 12/08/2008   Anxiety 11/07/2008    Allergies:  Allergies  Allergen Reactions   Latex Itching   Neosporin [Neomycin-Bacitracin Zn-Polymyx]    Prednisone Swelling    And redness  Sulfa Antibiotics Other (See Comments)   Benzalkonium Chloride Itching and Rash   Medications:  Current Outpatient Medications:    benzonatate (TESSALON) 100 MG capsule, Take 1 capsule (100 mg total) by mouth 3 (three) times daily as needed for cough., Disp: 30 capsule, Rfl: 0   nirmatrelvir/ritonavir, renal dosing, (PAXLOVID) 10 x 150 MG & 10 x 100MG  TABS, Take 2 tablets by mouth 2 (two) times daily for  5 days. (Take nirmatrelvir 150 mg one tablet twice daily for 5 days and ritonavir 100 mg one tablet twice daily for 5 days) Patient GFR is 56, Disp: 20 tablet, Rfl: 0   acetaminophen (TYLENOL) 500 MG tablet, Take 2 tablets (1,000 mg total) by mouth every 8 (eight) hours., Disp: 90 tablet, Rfl: 2   buPROPion (WELLBUTRIN XL) 150 MG 24 hr tablet, TAKE 1 TABLET(150 MG) BY MOUTH DAILY, Disp: 90 tablet, Rfl: 1   escitalopram (LEXAPRO) 20 MG tablet, Take 1 tablet (20 mg total) by mouth daily., Disp: 90 tablet, Rfl: 1   Multiple Vitamins-Minerals (WOMENS 50+ MULTI VITAMIN/MIN) TABS, Take by mouth daily., Disp: , Rfl:    simvastatin (ZOCOR) 40 MG tablet, TAKE 1 TABLET BY MOUTH DAILY, Disp: 90 tablet, Rfl: 3   valACYclovir (VALTREX) 500 MG tablet, TAKE 1 TABLET(500 MG) BY MOUTH TWICE DAILY AS NEEDED, Disp: 30 tablet, Rfl: 5  Observations/Objective: Patient is well-developed, well-nourished in no acute distress.  Resting comfortably at home.  Head is normocephalic, atraumatic.  No labored breathing. Speech is clear and coherent with logical content.  Patient is alert and oriented at baseline.   Assessment and Plan: 1. COVID-19 - benzonatate (TESSALON) 100 MG capsule; Take 1 capsule (100 mg total) by mouth 3 (three) times daily as needed for cough.  Dispense: 30 capsule; Refill: 0 - nirmatrelvir/ritonavir, renal dosing, (PAXLOVID) 10 x 150 MG & 10 x 100MG  TABS; Take 2 tablets by mouth 2 (two) times daily for 5 days. (Take nirmatrelvir 150 mg one tablet twice daily for 5 days and ritonavir 100 mg one tablet twice daily for 5 days) Patient GFR is 56  Dispense: 20 tablet; Refill: 0  Patient with multiple risk factors for complicated course of illness. Discussed risks/benefits of antiviral medications including most common potential ADRs. Patient voiced understanding and would like to proceed with antiviral medication. They are candidate for Paxlovid (renal dose).  She will hold off on her Simvastatin (last  dose yesterday) for the next 10 days Rx sent to pharmacy. Supportive measures, OTC medications and vitamin regimen reviewed. Tessalon per orders. Quarantine reviewed in detail. Strict ER precautions discussed with patient.    Follow Up Instructions: I discussed the assessment and treatment plan with the patient. The patient was provided an opportunity to ask questions and all were answered. The patient agreed with the plan and demonstrated an understanding of the instructions.  A copy of instructions were sent to the patient via MyChart unless otherwise noted below.   The patient was advised to call back or seek an in-person evaluation if the symptoms worsen or if the condition fails to improve as anticipated.  Time:  I spent 10 minutes with the patient via telehealth technology discussing the above problems/concerns.    Diana Climes, PA-C

## 2023-03-13 NOTE — Telephone Encounter (Signed)
  Chief Complaint: COVID positive  Symptoms: 102 fever, body aches, stiffness, cough, congestion and wheezing  Frequency: today  Pertinent Negatives: Patient denies SOB Disposition: [] ED /[] Urgent Care (no appt availability in office) / [] Appointment(In office/virtual)/ [x]  [facility]/ [] Home Care/ [] Refused Recommended Disposition /[]  Mobile Bus/ []  Follow-up with PCP Additional Notes: pt states sx started in middle of night, took a Covid test this morning and was positive. Pt has taken Tylenol for fever. No appts until 03/14/23. Scheduled [facility] appt at 1400 today. Care advice given and pt verbalized understanding.   Summary: Covid +   Patient states that she tested positive for Covid this morning. Patient says she has 102 fever, body aches and stiffness.     Reason for Disposition  [1] Fever > 101 F (38.3 C) AND [2] age > 60 years  Answer Assessment - Initial Assessment Questions 1. COVID-19 DIAGNOSIS: "How do you know that you have COVID?" (e.g., positive lab test or self-test, diagnosed by doctor or NP/PA, symptoms after exposure).     Home test today  3. ONSET: "When did the COVID-19 symptoms start?"      Middle of night  4. WORST SYMPTOM: "What is your worst symptom?" (e.g., cough, fever, shortness of breath, muscle aches)     Fever  5. COUGH: "Do you have a cough?" If Yes, ask: "How bad is the cough?"       yes 6. FEVER: "Do you have a fever?" If Yes, ask: "What is your temperature, how was it measured, and when did it start?"     102 7. RESPIRATORY STATUS: "Describe your breathing?" (e.g., normal; shortness of breath, wheezing, unable to speak)      Normal  9. OTHER SYMPTOMS: "Do you have any other symptoms?"  (e.g., chills, fatigue, headache, loss of smell or taste, muscle pain, sore throat)     Body aches, stiffness, congestion 10. HIGH RISK DISEASE: "Do you have any chronic medical problems?" (e.g., asthma, heart or lung disease, weak immune system,  obesity, etc.)       Mitral valve prolase 11. VACCINE: "Have you had the COVID-19 vaccine?" If Yes, ask: "Which one, how many shots, when did you get it?"       no  Protocols used: Coronavirus (COVID-19) Diagnosed or Suspected-A-AH

## 2023-04-08 ENCOUNTER — Other Ambulatory Visit: Payer: Self-pay | Admitting: Physician Assistant

## 2023-04-08 DIAGNOSIS — F331 Major depressive disorder, recurrent, moderate: Secondary | ICD-10-CM

## 2023-04-08 NOTE — Telephone Encounter (Signed)
Medication Refill - Medication:  escitalopram (LEXAPRO) 20 MG tablet  *completely out   Has the patient contacted their pharmacy? Yes, advised to contact PCP  Preferred Pharmacy (with phone number or street name):  [id number] [place], [place] - [address] AT [facility] OF [address] [facility]  Phone: [phone] Fax: [phone]  Has the patient been seen for an appointment in the last year OR does the patient have an upcoming appointment? Last seen 9.18.24

## 2023-04-09 MED ORDER — ESCITALOPRAM OXALATE 20 MG PO TABS
20.0000 mg | ORAL_TABLET | Freq: Every day | ORAL | 0 refills | Status: DC
Start: 2023-04-09 — End: 2023-07-12

## 2023-04-09 NOTE — Telephone Encounter (Signed)
Requested Prescriptions  Pending Prescriptions Disp Refills   escitalopram (LEXAPRO) 20 MG tablet 90 tablet 0    Sig: Take 1 tablet (20 mg total) by mouth daily.     Psychiatry:  Antidepressants - SSRI Passed - 04/08/2023  1:30 PM      Passed - Completed PHQ-2 or PHQ-9 in the last 360 days      Passed - Valid encounter within last 6 months    Recent Outpatient Visits           1 month ago Elevated blood pressure reading   [facility] [place], [place], PA-C   8 months ago Encounter for [employer] annual wellness exam   [facility] [place], [place], PA-C   1 year ago Acute viral conjunctivitis of left eye   [facility] [place], [place], PA-C   1 year ago Encounter for health maintenance examination   [facility] Alfredia Ferguson, PA-C   2 years ago Acute otitis externa of right ear, unspecified type   [facility] Beryle Flock, Marzella Schlein, MD       Future Appointments             In 6 months Deirdre Evener, MD [facility]

## 2023-06-03 ENCOUNTER — Ambulatory Visit
Admission: RE | Admit: 2023-06-03 | Discharge: 2023-06-03 | Disposition: A | Discharge status: Home / Self Care | Payer: Medicare Other | Source: Ambulatory Visit | Attending: Family Medicine

## 2023-06-03 ENCOUNTER — Encounter: Payer: Self-pay | Admitting: Family Medicine

## 2023-06-03 ENCOUNTER — Ambulatory Visit (INDEPENDENT_AMBULATORY_CARE_PROVIDER_SITE_OTHER): Payer: Medicare Other | Admitting: Family Medicine

## 2023-06-03 ENCOUNTER — Ambulatory Visit
Admission: RE | Admit: 2023-06-03 | Discharge: 2023-06-03 | Disposition: A | Discharge status: Home / Self Care | Payer: Medicare Other | Attending: Family Medicine | Admitting: Family Medicine

## 2023-06-03 VITALS — BP 118/78 | HR 65 | Ht 66.0 in | Wt 152.3 lb

## 2023-06-03 DIAGNOSIS — K649 Unspecified hemorrhoids: Secondary | ICD-10-CM | POA: Diagnosis not present

## 2023-06-03 DIAGNOSIS — L03011 Cellulitis of right finger: Secondary | ICD-10-CM

## 2023-06-03 DIAGNOSIS — M7989 Other specified soft tissue disorders: Secondary | ICD-10-CM | POA: Diagnosis not present

## 2023-06-03 DIAGNOSIS — R059 Cough, unspecified: Secondary | ICD-10-CM | POA: Insufficient documentation

## 2023-06-03 DIAGNOSIS — M19041 Primary osteoarthritis, right hand: Secondary | ICD-10-CM | POA: Diagnosis not present

## 2023-06-03 MED ORDER — BENZOCAINE 20 % RE OINT: TOPICAL_OINTMENT | RECTAL | 0 refills | Status: DC | PRN

## 2023-06-03 MED ORDER — MUPIROCIN 2 % EX OINT
1.0000 | TOPICAL_OINTMENT | Freq: Two times a day (BID) | CUTANEOUS | 0 refills | Status: AC
Start: 2023-06-03 — End: ?

## 2023-06-03 MED ORDER — DOXYCYCLINE HYCLATE 100 MG PO TABS
100.0000 mg | ORAL_TABLET | Freq: Two times a day (BID) | ORAL | 0 refills | Status: DC
Start: 2023-06-03 — End: 2023-08-12

## 2023-06-03 NOTE — Assessment & Plan Note (Addendum)
Pt appears to have chronic paronychia of Right index finger.  - Xray of R hand to rule out any bone involvement - mupirocin topically - Doxycycline 100 mg po BID for 5 days given chronic - warm salt water soaks to affected finger - Avoid nail salon minimally until infection dissipates - to limit further exposure pt agreeable to holding off for one month. - May cover with bandage.

## 2023-06-03 NOTE — Progress Notes (Signed)
Acute Office Visit  Introduced to nurse practitioner role and practice setting.  All questions answered.  Discussed provider/patient relationship and expectations.   Subjective:     Patient ID: Diana Blanchard, female    DOB: Jan 13, 1946, 77 y.o.   MRN: [id number]  Chief Complaint  Patient presents with   Hand Problem    Possible infection on right hand index finger. Reports it is off and on for weeks and then nothing. Reports she hits it at lot. Reports associated with swelling, pain and redness.     Pt presents with concerns for chronic nail infection in r index finger distally surrounding nail bed for at least a year to her knowledge. States for over a year, every few weeks she will take a needle and drain it because it gets swollen and has purulent drainage. Will be tender and more warm when it needs to be popped. She last drained it on 06/01/23, son advised her she should see provider. She gets nails done every 3-4 weeks. Has not taken an medications for the wound. Has carpal tunnel at baseline, but denies sensation changes to affected finger. States normal movement of finger. Denies fever, chills, shortness of breathing, palpitations, difficulty breathing, dizziness, or rigors. No recent antibiotics or known infections, had COVID in early fall.   Also has concerns for worsening hemorrhoids, has intermittently had them since having children. Has been using Preparation H, but they seem to be worsening in pain. Denies diarrhea, constipation, abdominal pain, changes in diet, bloody stools, melena.  Cough - gets random cough after eating sweets a couple times a weeks, cough will last a couple minutes, sounds like she is choking, but she doesn't feel like she is choking, cough will then shortly go away and then not bother her for awhile. Denies wheezing, swollen throat, difficulty breathing, shortness of breath, chest tightness, hx of anaphylaxis, heartburn, GERD.     Review of Systems   Constitutional:  Negative for chills, diaphoresis, fever, malaise/fatigue and weight loss.  HENT:  Negative for congestion and ear pain.   Gastrointestinal:        Hemorrhoids, chronic  All other systems reviewed and are negative.     Objective:    BP 118/78 (BP Location: Left Arm, Patient Position: Sitting, Cuff Size: Normal)   Pulse 65   Ht 5\' 6"  (1.676 m)   Wt 152 lb 4.8 oz (69.1 kg)   SpO2 99%   BMI 24.58 kg/m    Physical Exam Constitutional:      Appearance: Normal appearance. She is normal weight.  HENT:     Head: Normocephalic.     Nose: Nose normal.     Mouth/Throat:     Mouth: Mucous membranes are moist.  Eyes:     Extraocular Movements: Extraocular movements intact.     Pupils: Pupils are equal, round, and reactive to light.  Cardiovascular:     Rate and Rhythm: Normal rate and regular rhythm.     Pulses: Normal pulses.     Heart sounds: Normal heart sounds.  Pulmonary:     Effort: Pulmonary effort is normal.     Breath sounds: Normal breath sounds.  Musculoskeletal:     Right hand: Swelling and tenderness present. No bony tenderness. Normal range of motion. Normal strength. Normal sensation. Normal capillary refill. Normal pulse.     Left hand: Normal.     Cervical back: No tenderness.     Comments: R index finger, distal mild tenderness to palpation, mild  edema. Pt has chronic carpal tunnel bilateral, sensation at baseline  Lymphadenopathy:     Cervical: No cervical adenopathy.  Skin:    General: Skin is dry.     Capillary Refill: Capillary refill takes less than 2 seconds.     Findings: Erythema, rash and wound present. Rash is crusting.     Comments: R index finger, distal erythema, edema, present scab, no drainage, temperature normal  Neurological:     General: No focal deficit present.     Mental Status: She is alert and oriented to person, place, and time. Mental status is at baseline.  Psychiatric:        Mood and Affect: Mood normal.         Behavior: Behavior normal.        Thought Content: Thought content normal.        Judgment: Judgment normal.   R index finger  No results found for any visits on 06/03/23.      Assessment & Plan:   Problem List Items Addressed This Visit       Cardiovascular and Mediastinum   Hemorrhoids   Chronic hemorrhoids, pt using preparation H.  - Will try benzocaine topical rectally for assist in pain mgmt - may use witch hazel wipes and sitz bath - Stool softeners to ease bowel movements - Pt declines hydrocortisone suppositories at this time - if continues may need GI consult for further eval and mgmt.      Relevant Medications   benzocaine (AMERICAINE) 20 % rectal ointment     Musculoskeletal and Integument   Paronychia of finger, right - Primary   Pt appears to have chronic paronychia of Right index finger.  - Xray of R hand to rule out any bone involvement - mupirocin topically - Doxycycline 100 mg po BID for 5 days given chronic - warm salt water soaks to affected finger - Avoid nail salon minimally until infection dissipates - to limit further exposure pt agreeable to holding off for one month. - May cover with bandage.      Relevant Medications   mupirocin ointment (BACTROBAN) 2 %   doxycycline (VIBRA-TABS) 100 MG tablet   Other Relevant Orders   DG Hand Complete Right     Other   Cough   Pt had concern for intermittent cough, origin determination difficult at this time based on vague history - respiratory vs GI vs immune. Discussed with pt being aware of cough, triggers, time of day, activity, time course, and alleviation - this will help with better assessment at next visit if still continues.  - Discussed emergent cough symptoms and when to seek further care         Meds ordered this encounter  Medications   mupirocin ointment (BACTROBAN) 2 %    Sig: Apply 1 Application topically 2 (two) times daily. Apply to R ring finger    Dispense:  22 g    Refill:  0    doxycycline (VIBRA-TABS) 100 MG tablet    Sig: Take 1 tablet (100 mg total) by mouth 2 (two) times daily.    Dispense:  10 tablet    Refill:  0   benzocaine (AMERICAINE) 20 % rectal ointment    Sig: Place rectally every 3 (three) hours as needed for pain.    Dispense:  28.4 g    Refill:  0    Return if symptoms worsen or fail to improve.  I, Sallee Provencal, FNP, have reviewed all documentation  for this visit. The documentation on 06/03/23 for the exam, diagnosis, procedures, and orders are all accurate and complete.   Sallee Provencal, FNP

## 2023-06-03 NOTE — Assessment & Plan Note (Addendum)
Chronic hemorrhoids, pt using preparation H.  - Will try benzocaine topical rectally for assist in pain mgmt - may use witch hazel wipes and sitz bath - Stool softeners to ease bowel movements - Pt declines hydrocortisone suppositories at this time - if continues may need GI consult for further eval and mgmt.

## 2023-06-03 NOTE — Assessment & Plan Note (Addendum)
Pt had concern for intermittent cough, origin determination difficult at this time based on vague history - respiratory vs GI vs immune. Discussed with pt being aware of cough, triggers, time of day, activity, time course, and alleviation - this will help with better assessment at next visit if still continues.  - Discussed emergent cough symptoms and when to seek further care

## 2023-06-11 ENCOUNTER — Other Ambulatory Visit: Payer: Self-pay | Admitting: Physician Assistant

## 2023-06-11 DIAGNOSIS — F331 Major depressive disorder, recurrent, moderate: Secondary | ICD-10-CM

## 2023-06-11 DIAGNOSIS — E7800 Pure hypercholesterolemia, unspecified: Secondary | ICD-10-CM

## 2023-06-12 NOTE — Telephone Encounter (Signed)
Please, let pt know that LDL cholesterol goal is <70. Last lipid panel - from 03/06/23. We will need to repeat her labs, advised to schedule a follow-up in February with me after 07/27/2023

## 2023-06-13 ENCOUNTER — Telehealth: Payer: Self-pay | Admitting: Physician Assistant

## 2023-06-13 NOTE — Telephone Encounter (Signed)
Patient inquiring about her imaging results and would like a follow up call.

## 2023-06-16 ENCOUNTER — Encounter: Payer: Self-pay | Admitting: Family Medicine

## 2023-06-24 ENCOUNTER — Encounter: Payer: Self-pay | Admitting: Physician Assistant

## 2023-06-24 ENCOUNTER — Telehealth: Payer: Self-pay | Admitting: Physician Assistant

## 2023-06-24 DIAGNOSIS — K649 Unspecified hemorrhoids: Secondary | ICD-10-CM

## 2023-06-24 MED ORDER — BENZOCAINE 20 % RE OINT: TOPICAL_OINTMENT | RECTAL | 0 refills | Status: AC | PRN

## 2023-06-24 NOTE — Telephone Encounter (Signed)
 Pharmacy reports to pt. They did not receive benzocaine ointment (new medication). Please resend.

## 2023-06-24 NOTE — Telephone Encounter (Signed)
 Left message for pt. That benzocaine request to resend prescription has been sent to provider, new medication.

## 2023-06-24 NOTE — Telephone Encounter (Signed)
 Medication Refill -  Most Recent Primary Care Visit:  Provider: CLIFTON, KELLIE A  Department: [facility]  Visit Type: OFFICE VISIT  Date: 06/03/2023  Medication: benzocaine  (AMERICAINE) 20 % rectal ointment   Pt has not been able to pick up prescription. Pt called office to confirm prescription was sent to pharmacy on 06/03/2023. Pt spoke to pharmacy today and was informed that they did not have prescription and office would need to send it again.  Pt upset she has not been able to get the prescription and it has now been 3 weeks.   Has the patient contacted their pharmacy? Yes  Is this the correct pharmacy for this prescription? Yes  This is the patient's preferred pharmacy:  [id number] - [place],  - [address] AT [facility] OF [address] [place] [address] [place] [address] Phone: [phone] Fax: [phone]   Has the prescription been filled recently? Yes  Is the patient out of the medication? Yes  Has the patient been seen for an appointment in the last year OR does the patient have an upcoming appointment? Yes  Can we respond through MyChart? Yes  Agent: Please be advised that Rx refills may take up to 3 business days. We ask that you follow-up with your pharmacy.

## 2023-07-02 DIAGNOSIS — L600 Ingrowing nail: Secondary | ICD-10-CM | POA: Diagnosis not present

## 2023-07-02 DIAGNOSIS — L603 Nail dystrophy: Secondary | ICD-10-CM | POA: Diagnosis not present

## 2023-07-02 DIAGNOSIS — B351 Tinea unguium: Secondary | ICD-10-CM | POA: Diagnosis not present

## 2023-07-12 ENCOUNTER — Other Ambulatory Visit: Payer: Self-pay | Admitting: Physician Assistant

## 2023-07-12 DIAGNOSIS — F331 Major depressive disorder, recurrent, moderate: Secondary | ICD-10-CM

## 2023-07-14 MED ORDER — ESCITALOPRAM OXALATE 20 MG PO TABS: 20.0000 mg | ORAL_TABLET | Freq: Every day | ORAL | 0 refills | Status: DC

## 2023-07-16 DIAGNOSIS — L600 Ingrowing nail: Secondary | ICD-10-CM | POA: Diagnosis not present

## 2023-07-16 DIAGNOSIS — B351 Tinea unguium: Secondary | ICD-10-CM | POA: Diagnosis not present

## 2023-08-06 DIAGNOSIS — B351 Tinea unguium: Secondary | ICD-10-CM | POA: Diagnosis not present

## 2023-08-06 DIAGNOSIS — L03032 Cellulitis of left toe: Secondary | ICD-10-CM | POA: Diagnosis not present

## 2023-08-06 DIAGNOSIS — L600 Ingrowing nail: Secondary | ICD-10-CM | POA: Diagnosis not present

## 2023-08-06 DIAGNOSIS — L03031 Cellulitis of right toe: Secondary | ICD-10-CM | POA: Diagnosis not present

## 2023-08-10 NOTE — Progress Notes (Unsigned)
 Annual Wellness Visit     Patient: Diana Blanchard, Female    DOB: 06-03-1946, 78 y.o.   MRN: [id number] Visit Date: 08/12/2023  Today's Provider: Debera Lat, PA-C   No chief complaint on file.  Subjective    Diana Blanchard is a 78 y.o. female who presents today for her Annual Wellness Visit. She reports consuming a {diet types:17450} diet. {Exercise:19826} She generally feels {well/fairly well/poorly:18703}. She reports sleeping {well/fairly well/poorly:18703}. She {does/does not:200015} have additional problems to discuss today.   HPI    Medications: Outpatient Medications Prior to Visit  Medication Sig   acetaminophen (TYLENOL) 500 MG tablet Take 2 tablets (1,000 mg total) by mouth every 8 (eight) hours.   benzocaine (AMERICAINE) 20 % rectal ointment Place rectally every 3 (three) hours as needed for pain.   benzonatate (TESSALON) 100 MG capsule Take 1 capsule (100 mg total) by mouth 3 (three) times daily as needed for cough.   buPROPion (WELLBUTRIN XL) 150 MG 24 hr tablet TAKE 1 TABLET(150 MG) BY MOUTH DAILY   doxycycline (VIBRA-TABS) 100 MG tablet Take 1 tablet (100 mg total) by mouth 2 (two) times daily.   escitalopram (LEXAPRO) 20 MG tablet Take 1 tablet (20 mg total) by mouth daily.   Multiple Vitamins-Minerals (WOMENS 50+ MULTI VITAMIN/MIN) TABS Take by mouth daily.   mupirocin ointment (BACTROBAN) 2 % Apply 1 Application topically 2 (two) times daily. Apply to R ring finger   simvastatin (ZOCOR) 40 MG tablet TAKE 1 TABLET BY MOUTH DAILY   valACYclovir (VALTREX) 500 MG tablet TAKE 1 TABLET(500 MG) BY MOUTH TWICE DAILY AS NEEDED   No facility-administered medications prior to visit.    Allergies  Allergen Reactions   Latex Itching   Neosporin [Neomycin-Bacitracin Zn-Polymyx]    Prednisone Swelling    And redness   Sulfa Antibiotics Other (See Comments)   Benzalkonium Chloride Itching and Rash    Patient Care Team: Cherlynn Polo as PCP - General  (Physician Assistant) Deirdre Evener, MD (Dermatology) Eli Phillips, OD (Optometry) Wyn Quaker Marlow Baars, MD as Referring Physician (Vascular Surgery) Wynona Meals., MD (Dentistry)  Review of Systems  All other systems reviewed and are negative.   {Insert previous labs (optional):23779} {See past labs  Heme  Chem  Endocrine  Serology  Results Review (optional):1}    Objective    Vitals: There were no vitals taken for this visit. {Insert last BP/Wt (optional):23777}{See vitals history (optional):1}    Physical Exam ***  Most recent functional status assessment:    08/23/2022    6:45 AM  In your present state of health, do you have any difficulty performing the following activities:  Hearing? 0  Vision? 0  Difficulty concentrating or making decisions? 0  Walking or climbing stairs? 0  Dressing or bathing? 0   Most recent fall risk assessment:    07/26/2022   10:16 AM  Fall Risk   Falls in the past year? 0  Number falls in past yr: 0  Injury with Fall? 0  Risk for fall due to : No Fall Risks  Follow up Falls evaluation completed    Most recent depression screenings:    03/05/2023    1:20 PM 07/26/2022   10:16 AM  PHQ 2/9 Scores  PHQ - 2 Score 2 1  PHQ- 9 Score 2 1   Most recent cognitive screening:    07/28/2020    2:37 PM  6CIT Screen  What Year? 0 points  What month?  0 points  What time? 0 points  Count back from 20 0 points  Months in reverse 0 points  Repeat phrase 0 points  Total Score 0 points   Most recent Audit-C alcohol use screening    07/26/2022   10:15 AM  Alcohol Use Disorder Test (AUDIT)  1. How often do you have a drink containing alcohol? 4  2. How many drinks containing alcohol do you have on a typical day when you are drinking? 0  3. How often do you have six or more drinks on one occasion? 0  AUDIT-C Score 4   A score of 3 or more in women, and 4 or more in men indicates increased risk for alcohol abuse, EXCEPT if all of the  points are from question 1   No results found for any visits on 08/12/23.  Assessment & Plan     Annual wellness visit done today including the all of the following: Reviewed patient's Family Medical History Reviewed and updated list of patient's medical providers Assessment of cognitive impairment was done Assessed patient's functional ability Established a written schedule for health screening services Health Risk Assessent Completed and Reviewed  Exercise Activities and Dietary recommendations  Goals      DIET - EAT MORE FRUITS AND VEGETABLES     Exercise 3x per week (30 min per time)     Recommend to exercise for 3 days a week for at least 30 minutes at a time.          Immunization History  Administered Date(s) Administered   Fluad Quad(high Dose 65+) 03/17/2019, 03/23/2020   Influenza Split 04/03/2009, 03/01/2010, 03/08/2011, 04/06/2012   Influenza, High Dose Seasonal PF 04/19/2014, 04/05/2015, 04/11/2016, 03/26/2017, 04/08/2018   Influenza,inj,Quad PF,6+ Mos 04/07/2013   Influenza-Unspecified 03/26/2021, 04/12/2022   PFIZER(Purple Top)SARS-COV-2 Vaccination 06/21/2019, 07/12/2019, 04/20/2020   Pfizer Covid-19 Vaccine Bivalent Booster 17yrs & up 03/05/2021   Pneumococcal Conjugate-13 06/03/2014   Pneumococcal Polysaccharide-23 06/17/1998, 04/07/2013   Tdap 04/09/2005, 06/02/2015   Zoster Recombinant(Shingrix) 12/02/2017, 04/07/2018   Zoster, Live 01/13/2008    Health Maintenance  Topic Date Due   COVID-19 Vaccine (5 - 2024-25 season) 02/16/2023   Medicare Annual Wellness (AWV)  07/27/2023   INFLUENZA VACCINE  09/15/2023 (Originally 01/16/2023)   MAMMOGRAM  10/14/2023   DTaP/Tdap/Td (3 - Td or Tdap) 06/01/2025   Colonoscopy  07/27/2025   DEXA SCAN  10/14/2027   Pneumonia Vaccine 25+ Years old  Completed   Hepatitis C Screening  Completed   Zoster Vaccines- Shingrix  Completed   HPV VACCINES  Aged Out     Discussed health benefits of physical activity, and  encouraged her to engage in regular exercise appropriate for her age and condition.    ***  No follow-ups on file.     {provider attestation***:1}   Debera Lat, PA-C  [facility] [phone] (phone) [phone] (fax)  [facility]

## 2023-08-12 ENCOUNTER — Encounter: Payer: Self-pay | Admitting: Physician Assistant

## 2023-08-12 ENCOUNTER — Ambulatory Visit (INDEPENDENT_AMBULATORY_CARE_PROVIDER_SITE_OTHER): Payer: Medicare Other | Admitting: Physician Assistant

## 2023-08-12 VITALS — BP 146/96 | HR 60 | Resp 16 | Ht 66.0 in | Wt 151.0 lb

## 2023-08-12 DIAGNOSIS — Z0000 Encounter for general adult medical examination without abnormal findings: Secondary | ICD-10-CM | POA: Diagnosis not present

## 2023-08-12 DIAGNOSIS — F331 Major depressive disorder, recurrent, moderate: Secondary | ICD-10-CM

## 2023-08-12 DIAGNOSIS — E7800 Pure hypercholesterolemia, unspecified: Secondary | ICD-10-CM

## 2023-08-12 DIAGNOSIS — R7303 Prediabetes: Secondary | ICD-10-CM

## 2023-08-20 DIAGNOSIS — I739 Peripheral vascular disease, unspecified: Secondary | ICD-10-CM | POA: Diagnosis not present

## 2023-08-20 DIAGNOSIS — L603 Nail dystrophy: Secondary | ICD-10-CM | POA: Diagnosis not present

## 2023-08-20 DIAGNOSIS — L600 Ingrowing nail: Secondary | ICD-10-CM | POA: Diagnosis not present

## 2023-08-20 DIAGNOSIS — B351 Tinea unguium: Secondary | ICD-10-CM | POA: Diagnosis not present

## 2023-08-21 ENCOUNTER — Other Ambulatory Visit (INDEPENDENT_AMBULATORY_CARE_PROVIDER_SITE_OTHER): Payer: Self-pay | Admitting: Podiatry

## 2023-08-21 DIAGNOSIS — I739 Peripheral vascular disease, unspecified: Secondary | ICD-10-CM

## 2023-08-22 ENCOUNTER — Ambulatory Visit (INDEPENDENT_AMBULATORY_CARE_PROVIDER_SITE_OTHER)

## 2023-08-22 DIAGNOSIS — I739 Peripheral vascular disease, unspecified: Secondary | ICD-10-CM | POA: Diagnosis not present

## 2023-08-25 LAB — VAS US ABI WITH/WO TBI
Left ABI: 1.21
Right ABI: 1.16

## 2023-08-26 ENCOUNTER — Ambulatory Visit (INDEPENDENT_AMBULATORY_CARE_PROVIDER_SITE_OTHER): Payer: Medicare Other | Admitting: Physician Assistant

## 2023-08-26 VITALS — BP 126/80 | HR 66 | Resp 16 | Ht 66.0 in | Wt 150.6 lb

## 2023-08-26 DIAGNOSIS — K649 Unspecified hemorrhoids: Secondary | ICD-10-CM

## 2023-08-26 DIAGNOSIS — R21 Rash and other nonspecific skin eruption: Secondary | ICD-10-CM

## 2023-08-26 DIAGNOSIS — R030 Elevated blood-pressure reading, without diagnosis of hypertension: Secondary | ICD-10-CM

## 2023-08-26 DIAGNOSIS — K625 Hemorrhage of anus and rectum: Secondary | ICD-10-CM | POA: Diagnosis not present

## 2023-08-26 DIAGNOSIS — F331 Major depressive disorder, recurrent, moderate: Secondary | ICD-10-CM

## 2023-08-26 DIAGNOSIS — E7800 Pure hypercholesterolemia, unspecified: Secondary | ICD-10-CM

## 2023-08-26 DIAGNOSIS — R7303 Prediabetes: Secondary | ICD-10-CM

## 2023-08-26 NOTE — Progress Notes (Unsigned)
 Established patient visit  Patient: Diana Blanchard   DOB: [birth year]   78 y.o. Female  MRN: [id number] Visit Date: 08/26/2023  Today's healthcare provider: Debera Lat, PA-C   Chief Complaint  Patient presents with   Follow-up    2 week f/u bp.. referral  for hemorrhoids.   Subjective     Discussed the use of AI scribe software for clinical note transcription with the patient, who gave verbal consent to proceed.  History of Present Illness   The patient, with a history of hypertension, varicose veins, and arthritis, presents with concerns about her blood pressure and rectal bleeding. The patient has been monitoring her blood pressure at home and has noticed fluctuations, with readings ranging from 102/63 to 144/90. The patient attributes these fluctuations to stress, particularly related to caring for her elderly mother and uncle.  The patient also reports rectal bleeding, which is particularly severe during bowel movements. The bleeding is described as bright red and can be significant enough to drip on the floor. The patient has a history of internal hemorrhoids and has previously had polyps removed during a colonoscopy. The patient is due to travel to [place] for a week and is concerned about scheduling appointments around this.  The patient also mentions having cataract surgery a year and a half ago and needing to wear reading glasses. The patient has a history of varicose veins and arthritis. The patient also mentions the appearance of small red dots on her legs, which do not itch or cause discomfort.           08/12/2023   10:39 AM 03/05/2023    1:20 PM 07/26/2022   10:16 AM  Depression screen PHQ 2/9  Decreased Interest 0 1 0  Down, Depressed, Hopeless 1 1 1   PHQ - 2 Score 1 2 1   Altered sleeping 0 0 0  Tired, decreased energy 0 0 0  Change in appetite 0 0 0  Feeling bad or failure about yourself  0 0 0  Trouble concentrating 0 0 0  Moving slowly or fidgety/restless 0 0 0   Suicidal thoughts 0 0 0  PHQ-9 Score 1 2 1   Difficult doing work/chores  Not difficult at all Not difficult at all      08/12/2023   10:55 AM 03/05/2023    1:19 PM 01/13/2020    5:56 PM  GAD 7 : Generalized Anxiety Score  Nervous, Anxious, on Edge 0 1 1  Control/stop worrying 0 1 1  Worry too much - different things 1 1 1   Trouble relaxing 0 1 0  Restless 0 0 0  Easily annoyed or irritable 0 0 1  Afraid - awful might happen 0 0 0  Total GAD 7 Score 1 4 4   Anxiety Difficulty Not difficult at all  Somewhat difficult    Medications: Outpatient Medications Prior to Visit  Medication Sig   benzocaine (AMERICAINE) 20 % rectal ointment Place rectally every 3 (three) hours as needed for pain.   buPROPion (WELLBUTRIN XL) 150 MG 24 hr tablet TAKE 1 TABLET(150 MG) BY MOUTH DAILY   escitalopram (LEXAPRO) 20 MG tablet Take 1 tablet (20 mg total) by mouth daily.   Multiple Vitamins-Minerals (WOMENS 50+ MULTI VITAMIN/MIN) TABS Take by mouth daily.   mupirocin ointment (BACTROBAN) 2 % Apply 1 Application topically 2 (two) times daily. Apply to R ring finger   simvastatin (ZOCOR) 40 MG tablet TAKE 1 TABLET BY MOUTH DAILY   valACYclovir (VALTREX) 500 MG tablet TAKE  1 TABLET(500 MG) BY MOUTH TWICE DAILY AS NEEDED   No facility-administered medications prior to visit.    Review of Systems All negative Except see HPI       Objective    BP 126/80 (BP Location: Right Arm, Patient Position: Sitting, Cuff Size: Normal)   Pulse 66   Resp 16   Ht 5\' 6"  (1.676 m)   Wt 150 lb 9.6 oz (68.3 kg)   SpO2 95%   BMI 24.31 kg/m     Physical Exam Vitals reviewed.  Constitutional:      General: She is not in acute distress.    Appearance: Normal appearance. She is well-developed. She is not diaphoretic.  HENT:     Head: Normocephalic and atraumatic.  Eyes:     General: No scleral icterus.    Conjunctiva/sclera: Conjunctivae normal.  Neck:     Thyroid: No thyromegaly.  Cardiovascular:      Rate and Rhythm: Normal rate and regular rhythm.     Pulses: Normal pulses.     Heart sounds: Normal heart sounds. No murmur heard. Pulmonary:     Effort: Pulmonary effort is normal. No respiratory distress.     Breath sounds: Normal breath sounds. No wheezing, rhonchi or rales.  Musculoskeletal:     Cervical back: Neck supple.     Right lower leg: No edema.     Left lower leg: No edema.  Lymphadenopathy:     Cervical: No cervical adenopathy.  Skin:    General: Skin is warm and dry.     Findings: No rash.  Neurological:     Mental Status: She is alert and oriented to person, place, and time. Mental status is at baseline.  Psychiatric:        Mood and Affect: Mood normal.        Behavior: Behavior normal.      No results found for any visits on 08/26/23.      Assessment and Plan    Rectal Bleeding Bright red bleeding likely due to hemorrhoids. Previous colonoscopy 3 years ago showed internal hemorrhoids and polyps, which were removed. Severity varies. - Refer to Dr. Servando Snare, gastroenterologist, for evaluation of rectal bleeding and hemorrhoids. - Advise scheduling appointment after returning from travel. - Encourage dietary adjustments for soft stools, including increased water intake. Will follow-up with labs: cbc, cmp  Prediabetes Pre-diabetic range A1c and decreased kidney function noted. - Schedule regular eye exams. - Monitor A1c and kidney function in six months. - Encourage physical activity to manage prediabetes And low-carb diet.  Will follow-up with labs: A1c, cmp, lp  Hypercholesterolemia Chronic No need to order LP Continue low-cholesterol diet and regular exercise The 10-year ASCVD risk score (Arnett DK, et al., 2019) is: 17.6% Continue simvastatin 40 Mg Will reassess after lab results will be back  Depression Chronic Well-controlled continue current medication Continue Lexapro 20 Will follow-up  Elevated BP reading Intermittent elevated blood  pressure, possibly stress-related. Current readings normal. - Monitor blood pressure periodically. - Advise low salt intake. - Advise stress reduction. - Return if blood pressure consistently exceeds 140/90 for medication initiation.  Rash Sporadic red dots on legs. No itching or growth. - Await dermatology evaluation for further assessment of red dots on legs.  General Health Maintenance Cataract surgery 1.5 years ago, requires readers.      Orders Placed This Encounter  Procedures   Ambulatory referral to Gastroenterology    Referral Priority:   Urgent    Referral Type:  Consultation    Referral Reason:   Specialty Services Required    Number of Visits Requested:   1    No follow-ups on file.   The patient was advised to call back or seek an in-person evaluation if the symptoms worsen or if the condition fails to improve as anticipated.  I discussed the assessment and treatment plan with the patient. The patient was provided an opportunity to ask questions and all were answered. The patient agreed with the plan and demonstrated an understanding of the instructions.  I, Debera Lat, PA-C have reviewed all documentation for this visit. The documentation on 08/26/2023  for the exam, diagnosis, procedures, and orders are all accurate and complete. I spent 30 minutes dedicated to the care of this patient on the date of this encounter to include pre-visit review of records, face-to-face with the patient discussing condition/treatment: prediabetes , and post visit ordering of referral and completing documentation.  Debera Lat, [facility], [facility] [phone] (phone) [phone] (fax)  [facility]

## 2023-08-27 ENCOUNTER — Encounter: Payer: Self-pay | Admitting: Physician Assistant

## 2023-09-08 ENCOUNTER — Telehealth: Payer: Self-pay

## 2023-09-08 NOTE — Telephone Encounter (Signed)
 The patient called back regarding a missed call. She mentioned that the nurse was supposed to contact her cell phone, not her home phone, as she was out of town in [place]. The patient is now back and needs to be seen soon. I explained that the nurse likely called due to an upcoming appointment. The patient requested to speak with the nurse who called. I forwarded the message to [facility] for follow-up.

## 2023-09-09 NOTE — Telephone Encounter (Signed)
 We received a referral to scheduled a appointment I called the number in the chart to schedule because we had a a provider open the schedule. We do not have no appointments available at this time so I am unable to schedule any new patient appointments.

## 2023-09-10 ENCOUNTER — Other Ambulatory Visit: Payer: Self-pay | Admitting: Physician Assistant

## 2023-09-10 DIAGNOSIS — Z1231 Encounter for screening mammogram for malignant neoplasm of breast: Secondary | ICD-10-CM

## 2023-09-12 NOTE — Telephone Encounter (Signed)
 I spoke to pt, she was upset as she said that no one ever called her back to let her know anything whether she can be scheduled for an OV...  Pt advised to reach back out to PCP regarding this and make sure this is not a general surgery vs GI issue and have them place an urgent referral...   Pt expressed understanding and said that she wished that someone had called her earlier in the week to let her know   Nothing further needed

## 2023-09-15 DIAGNOSIS — R030 Elevated blood-pressure reading, without diagnosis of hypertension: Secondary | ICD-10-CM | POA: Diagnosis not present

## 2023-09-15 DIAGNOSIS — R7303 Prediabetes: Secondary | ICD-10-CM | POA: Diagnosis not present

## 2023-09-15 DIAGNOSIS — K625 Hemorrhage of anus and rectum: Secondary | ICD-10-CM | POA: Diagnosis not present

## 2023-09-16 ENCOUNTER — Encounter: Payer: Self-pay | Admitting: Physician Assistant

## 2023-09-16 DIAGNOSIS — E7800 Pure hypercholesterolemia, unspecified: Secondary | ICD-10-CM

## 2023-09-16 LAB — LIPID PANEL
Chol/HDL Ratio: 2.6 ratio (ref 0.0–4.4)
Cholesterol, Total: 208 mg/dL — ABNORMAL HIGH (ref 100–199)
HDL: 79 mg/dL (ref >39)
LDL Chol Calc (NIH): 111 mg/dL — ABNORMAL HIGH (ref 0–99)
Triglycerides: 104 mg/dL (ref 0–149)
VLDL Cholesterol Cal: 18 mg/dL (ref 5–40)

## 2023-09-16 LAB — CBC WITH DIFFERENTIAL/PLATELET
Basophils Absolute: 0.1 10*3/uL (ref 0.0–0.2)
Basos: 1 %
EOS (ABSOLUTE): 0.3 10*3/uL (ref 0.0–0.4)
Eos: 5 %
Hematocrit: 41.1 % (ref 34.0–46.6)
Hemoglobin: 13.8 g/dL (ref 11.1–15.9)
Immature Grans (Abs): 0 10*3/uL (ref 0.0–0.1)
Immature Granulocytes: 0 %
Lymphocytes Absolute: 1.9 10*3/uL (ref 0.7–3.1)
Lymphs: 36 %
MCH: 32.7 pg (ref 26.6–33.0)
MCHC: 33.6 g/dL (ref 31.5–35.7)
MCV: 97 fL (ref 79–97)
Monocytes Absolute: 0.6 10*3/uL (ref 0.1–0.9)
Monocytes: 11 %
Neutrophils Absolute: 2.5 10*3/uL (ref 1.4–7.0)
Neutrophils: 47 %
Platelets: 293 10*3/uL (ref 150–450)
RBC: 4.22 x10E6/uL (ref 3.77–5.28)
RDW: 12.3 % (ref 11.7–15.4)
WBC: 5.3 10*3/uL (ref 3.4–10.8)

## 2023-09-16 LAB — COMPREHENSIVE METABOLIC PANEL WITH GFR
ALT: 7 IU/L (ref 0–32)
AST: 19 IU/L (ref 0–40)
Albumin: 4.5 g/dL (ref 3.8–4.8)
Alkaline Phosphatase: 87 IU/L (ref 44–121)
BUN/Creatinine Ratio: 17 (ref 12–28)
BUN: 18 mg/dL (ref 8–27)
Bilirubin Total: 0.6 mg/dL (ref 0.0–1.2)
CO2: 23 mmol/L (ref 20–29)
Calcium: 9.7 mg/dL (ref 8.7–10.3)
Chloride: 105 mmol/L (ref 96–106)
Creatinine, Ser: 1.08 mg/dL — ABNORMAL HIGH (ref 0.57–1.00)
Globulin, Total: 1.8 g/dL (ref 1.5–4.5)
Glucose: 89 mg/dL (ref 70–99)
Potassium: 4.5 mmol/L (ref 3.5–5.2)
Sodium: 143 mmol/L (ref 134–144)
Total Protein: 6.3 g/dL (ref 6.0–8.5)
eGFR: 53 mL/min/{1.73_m2} — ABNORMAL LOW (ref >59)

## 2023-09-16 LAB — HEMOGLOBIN A1C
Est. average glucose Bld gHb Est-mCnc: 123 mg/dL
Hgb A1c MFr Bld: 5.9 % — ABNORMAL HIGH (ref 4.8–5.6)

## 2023-09-17 ENCOUNTER — Telehealth: Payer: Self-pay

## 2023-09-17 NOTE — Telephone Encounter (Signed)
 Copied from CRM [id number]. Topic: Referral - Status >> Sep 12, 2023 11:01 AM Fuller Mandril wrote: Reason for CRM: Patient called. States she was unavailable when Laurette Schimke called and she has been trying to call them back for three days. One time she was able to speak to someone who was not able to assist her and then she called back a few more times and keeps getting recording. Wanted to know if there was anything provider could do to assist. Thank You

## 2023-09-17 NOTE — Telephone Encounter (Signed)
 Copied from CRM [id number]. Topic: Referral - Question >> Sep 12, 2023 11:40 AM Priscille Loveless wrote: Reason for CRM: Pt called and stated that she had needed a referral for a gastroenterologist. The one that we sent a referral to does not do iffice and only hosp. They advised that they could not see her.  She needs to have another referral done to another dr and practice.    Thank you!

## 2023-09-19 MED ORDER — EZETIMIBE 10 MG PO TABS: 10.0000 mg | ORAL_TABLET | Freq: Every day | ORAL | 3 refills | Status: AC

## 2023-09-23 ENCOUNTER — Telehealth: Payer: Self-pay | Admitting: Physician Assistant

## 2023-09-23 ENCOUNTER — Telehealth: Payer: Self-pay

## 2023-09-23 ENCOUNTER — Encounter: Payer: Self-pay | Admitting: Physician Assistant

## 2023-09-23 DIAGNOSIS — K649 Unspecified hemorrhoids: Secondary | ICD-10-CM

## 2023-09-23 DIAGNOSIS — K625 Hemorrhage of anus and rectum: Secondary | ICD-10-CM

## 2023-09-23 NOTE — Telephone Encounter (Signed)
 Copied from CRM [id number]. Topic: Referral - Request for Referral >> Sep 22, 2023 12:50 PM Higinio Roger wrote: Did the patient discuss referral with their provider in the last year? Yes (If No - schedule appointment) (If Yes - send message)  Appointment offered? No  Type of order/referral and detailed reason for visit: Gastroenterology  Preference of office, provider, location:  [facility] [name] [place], [place] [address] Office: [phone] Fax: [phone]  If referral order, have you been seen by this specialty before? No (If Yes, this issue or another issue? When? Where?  Can we respond through MyChart? Yes

## 2023-09-23 NOTE — Telephone Encounter (Signed)
 Can we send her referral for GI, please.  Preference of office, provider, location:  [facility] [address] [place], [place] [address] Office: [phone] Fax: [phone]

## 2023-10-07 ENCOUNTER — Other Ambulatory Visit: Payer: Self-pay | Admitting: Physician Assistant

## 2023-10-07 DIAGNOSIS — F331 Major depressive disorder, recurrent, moderate: Secondary | ICD-10-CM

## 2023-10-07 DIAGNOSIS — K641 Second degree hemorrhoids: Secondary | ICD-10-CM | POA: Diagnosis not present

## 2023-10-07 DIAGNOSIS — K625 Hemorrhage of anus and rectum: Secondary | ICD-10-CM | POA: Diagnosis not present

## 2023-10-08 ENCOUNTER — Encounter: Payer: Self-pay | Admitting: Dermatology

## 2023-10-08 ENCOUNTER — Ambulatory Visit: Payer: Medicare Other | Admitting: Dermatology

## 2023-10-08 DIAGNOSIS — L578 Other skin changes due to chronic exposure to nonionizing radiation: Secondary | ICD-10-CM

## 2023-10-08 DIAGNOSIS — Z1283 Encounter for screening for malignant neoplasm of skin: Secondary | ICD-10-CM

## 2023-10-08 DIAGNOSIS — B001 Herpesviral vesicular dermatitis: Secondary | ICD-10-CM

## 2023-10-08 DIAGNOSIS — L814 Other melanin hyperpigmentation: Secondary | ICD-10-CM

## 2023-10-08 DIAGNOSIS — W908XXA Exposure to other nonionizing radiation, initial encounter: Secondary | ICD-10-CM | POA: Diagnosis not present

## 2023-10-08 DIAGNOSIS — Z7189 Other specified counseling: Secondary | ICD-10-CM

## 2023-10-08 DIAGNOSIS — D229 Melanocytic nevi, unspecified: Secondary | ICD-10-CM

## 2023-10-08 DIAGNOSIS — L821 Other seborrheic keratosis: Secondary | ICD-10-CM

## 2023-10-08 DIAGNOSIS — L820 Inflamed seborrheic keratosis: Secondary | ICD-10-CM | POA: Diagnosis not present

## 2023-10-08 DIAGNOSIS — D1801 Hemangioma of skin and subcutaneous tissue: Secondary | ICD-10-CM

## 2023-10-08 DIAGNOSIS — Z86018 Personal history of other benign neoplasm: Secondary | ICD-10-CM

## 2023-10-08 NOTE — Patient Instructions (Addendum)

## 2023-10-08 NOTE — Telephone Encounter (Signed)
 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

## 2023-10-08 NOTE — Progress Notes (Signed)
 Follow-Up Visit   Subjective  Diana Blanchard is a 78 y.o. female who presents for the following: Skin Cancer Screening and Full Body Skin Exam hx of Dysplastic nevi, Aks, check spots that are irritating, back, legs, face, check spot L nose, hx of mole removed ~30 yrs ago  The patient presents for Total-Body Skin Exam (TBSE) for skin cancer screening and mole check. The patient has spots, moles and lesions to be evaluated, some may be new or changing and the patient may have concern these could be cancer.  The following portions of the chart were reviewed this encounter and updated as appropriate: medications, allergies, medical history  Review of Systems:  No other skin or systemic complaints except as noted in HPI or Assessment and Plan.  Objective  Well appearing patient in no apparent distress; mood and affect are within normal limits.  A full examination was performed including scalp, head, eyes, ears, nose, lips, neck, chest, axillae, abdomen, back, buttocks, bilateral upper extremities, bilateral lower extremities, hands, feet, fingers, toes, fingernails, and toenails. All findings within normal limits unless otherwise noted below.   Relevant physical exam findings are noted in the Assessment and Plan.  L face x 2, R ant shoulder x 1, back x 2, L pretibial x 1, L ant thigh x 1, L nasal ala x 1, R neck x 1 (9) Stuck on waxy paps with erythema L nasal ala tan macule  Assessment & Plan   SKIN CANCER SCREENING PERFORMED TODAY.  ACTINIC DAMAGE - Chronic condition, secondary to cumulative UV/sun exposure - diffuse scaly erythematous macules with underlying dyspigmentation - Recommend daily broad spectrum sunscreen SPF 30+ to sun-exposed areas, reapply every 2 hours as needed.  - Staying in the shade or wearing long sleeves, sun glasses (UVA+UVB protection) and wide brim hats (4-inch brim around the entire circumference of the hat) are also recommended for sun protection.  - Call for  new or changing lesions.  LENTIGINES, SEBORRHEIC KERATOSES, HEMANGIOMAS - Benign normal skin lesions - Benign-appearing - Call for any changes  MELANOCYTIC NEVI - Tan-brown and/or pink-flesh-colored symmetric macules and papules - Benign appearing on exam today - Observation - Call clinic for new or changing moles - Recommend daily use of broad spectrum spf 30+ sunscreen to sun-exposed areas.   HISTORY OF DYSPLASTIC NEVUS No evidence of recurrence today Recommend regular full body skin exams Recommend daily broad spectrum sunscreen SPF 30+ to sun-exposed areas, reapply every 2 hours as needed.  Call if any new or changing lesions are noted between office visits  - Right lat buttock, right back braline, right back paraspinal med to inf scapula   HERPESVIRAL INFECTION (COLD SORES) R medial buttocks Exam: pink patch R medial buttocks Chronic and persistent condition with duration or expected duration over one year. Condition is bothersome/symptomatic for patient. Currently flared. Herpes Simplex Virus = Cold Sores = Fever Blisters is a chronic recurring blistering; scabbing sore-producing viral infection that is recurrent usually in the same area triggered by stress, sun/UV exposure and trauma.  It is infectious and can be spread from person to person by direct contact.  It is not curable, but is treatable with topical and oral medication. Treatment Plan Cont Valtrex  500mg  bid as prescribed by PCP   INFLAMED SEBORRHEIC KERATOSIS (9) L face x 2, R ant shoulder x 1, back x 2, L pretibial x 1, L ant thigh x 1, L nasal ala x 1, R neck x 1 (9) Symptomatic, irritating, patient would like  treated. Recheck L nasal ala at next visit Destruction of lesion - L face x 2, R ant shoulder x 1, back x 2, L pretibial x 1, L ant thigh x 1, L nasal ala x 1, R neck x 1 (9) Complexity: simple   Destruction method: cryotherapy   Informed consent: discussed and consent obtained   Timeout:  patient name,  date of birth, surgical site, and procedure verified Lesion destroyed using liquid nitrogen: Yes   Region frozen until ice ball extended beyond lesion: Yes   Outcome: patient tolerated procedure well with no complications   Post-procedure details: wound care instructions given   Return for 1m recheck ISK L nasal ala, 1 yr TBSE, Hx of Dysplastic nevi, Hx of AKs.  I, Rollie Clipper, RMA, am acting as scribe for Celine Collard, MD .   Documentation: I have reviewed the above documentation for accuracy and completeness, and I agree with the above.  Celine Collard, MD

## 2023-10-14 DIAGNOSIS — D122 Benign neoplasm of ascending colon: Secondary | ICD-10-CM | POA: Diagnosis not present

## 2023-10-14 DIAGNOSIS — Z860100 Personal history of colon polyps, unspecified: Secondary | ICD-10-CM | POA: Diagnosis not present

## 2023-10-14 DIAGNOSIS — K635 Polyp of colon: Secondary | ICD-10-CM | POA: Diagnosis not present

## 2023-10-14 DIAGNOSIS — K642 Third degree hemorrhoids: Secondary | ICD-10-CM | POA: Diagnosis not present

## 2023-10-14 DIAGNOSIS — Z1211 Encounter for screening for malignant neoplasm of colon: Secondary | ICD-10-CM | POA: Diagnosis not present

## 2023-10-14 DIAGNOSIS — K644 Residual hemorrhoidal skin tags: Secondary | ICD-10-CM | POA: Diagnosis not present

## 2023-10-14 DIAGNOSIS — K5730 Diverticulosis of large intestine without perforation or abscess without bleeding: Secondary | ICD-10-CM | POA: Diagnosis not present

## 2023-10-14 DIAGNOSIS — D120 Benign neoplasm of cecum: Secondary | ICD-10-CM | POA: Diagnosis not present

## 2023-10-15 ENCOUNTER — Ambulatory Visit
Admission: RE | Admit: 2023-10-15 | Discharge: 2023-10-15 | Disposition: A | Discharge status: Home / Self Care | Source: Ambulatory Visit | Attending: Physician Assistant | Admitting: Physician Assistant

## 2023-10-15 DIAGNOSIS — Z1231 Encounter for screening mammogram for malignant neoplasm of breast: Secondary | ICD-10-CM | POA: Insufficient documentation

## 2023-10-15 DIAGNOSIS — D120 Benign neoplasm of cecum: Secondary | ICD-10-CM | POA: Diagnosis not present

## 2023-10-15 DIAGNOSIS — D122 Benign neoplasm of ascending colon: Secondary | ICD-10-CM | POA: Diagnosis not present

## 2023-10-17 ENCOUNTER — Encounter: Payer: Self-pay | Admitting: Physician Assistant

## 2023-11-04 ENCOUNTER — Encounter (INDEPENDENT_AMBULATORY_CARE_PROVIDER_SITE_OTHER): Payer: Self-pay

## 2023-11-04 DIAGNOSIS — K641 Second degree hemorrhoids: Secondary | ICD-10-CM | POA: Diagnosis not present

## 2023-11-04 DIAGNOSIS — K625 Hemorrhage of anus and rectum: Secondary | ICD-10-CM | POA: Diagnosis not present

## 2023-11-05 DIAGNOSIS — F3342 Major depressive disorder, recurrent, in full remission: Secondary | ICD-10-CM | POA: Diagnosis not present

## 2023-11-22 ENCOUNTER — Other Ambulatory Visit: Payer: Self-pay | Admitting: Physician Assistant

## 2023-11-22 DIAGNOSIS — F331 Major depressive disorder, recurrent, moderate: Secondary | ICD-10-CM

## 2023-11-24 DIAGNOSIS — K641 Second degree hemorrhoids: Secondary | ICD-10-CM | POA: Diagnosis not present

## 2023-12-16 DIAGNOSIS — K641 Second degree hemorrhoids: Secondary | ICD-10-CM | POA: Diagnosis not present

## 2023-12-22 ENCOUNTER — Telehealth: Payer: Self-pay | Admitting: Physician Assistant

## 2023-12-22 DIAGNOSIS — F331 Major depressive disorder, recurrent, moderate: Secondary | ICD-10-CM

## 2023-12-22 NOTE — Telephone Encounter (Signed)
 [facility] pharmacy is requesting refill buPROPion (WELLBUTRIN XL) 150 MG 24 hr tablet  Please advise

## 2023-12-24 MED ORDER — BUPROPION HCL ER (XL) 150 MG PO TB24: ORAL_TABLET | ORAL | 1 refills | Status: DC

## 2024-01-15 DIAGNOSIS — H43813 Vitreous degeneration, bilateral: Secondary | ICD-10-CM | POA: Diagnosis not present

## 2024-01-15 DIAGNOSIS — H26493 Other secondary cataract, bilateral: Secondary | ICD-10-CM | POA: Diagnosis not present

## 2024-01-15 DIAGNOSIS — Z961 Presence of intraocular lens: Secondary | ICD-10-CM | POA: Diagnosis not present

## 2024-01-19 ENCOUNTER — Other Ambulatory Visit: Payer: Self-pay | Admitting: Physician Assistant

## 2024-01-22 ENCOUNTER — Other Ambulatory Visit: Payer: Self-pay | Admitting: Physician Assistant

## 2024-01-22 DIAGNOSIS — F331 Major depressive disorder, recurrent, moderate: Secondary | ICD-10-CM

## 2024-01-22 NOTE — Telephone Encounter (Unsigned)
 Copied from CRM #[id number]. Topic: Clinical - Medication Refill >> Jan 22, 2024 10:06 AM Janeecia G wrote: Medication: escitalopram   Has the patient contacted their pharmacy? No (Agent: If no, request that the patient contact the pharmacy for the refill. If patient does not wish to contact the pharmacy document the reason why and proceed with request.) (Agent: If yes, when and what did the pharmacy advise?)  This is the patient's preferred pharmacy:  [id number] [place], [place] - [address] AT [facility] OF [address] [place] [address] [place] [address] Phone: [phone] Fax: [phone]  Is this the correct pharmacy for this prescription? Yes If no, delete pharmacy and type the correct one.   Has the prescription been filled recently? No  Is the patient out of the medication? Yes  Has the patient been seen for an appointment in the last year OR does the patient have an upcoming appointment? Yes  Can we respond through MyChart? Yes  Agent: Please be advised that Rx refills may take up to 3 business days. We ask that you follow-up with your pharmacy.

## 2024-01-23 ENCOUNTER — Other Ambulatory Visit: Payer: Self-pay

## 2024-01-23 ENCOUNTER — Ambulatory Visit: Payer: Self-pay | Admitting: Physician Assistant

## 2024-01-23 DIAGNOSIS — F331 Major depressive disorder, recurrent, moderate: Secondary | ICD-10-CM

## 2024-01-23 MED ORDER — ESCITALOPRAM OXALATE 20 MG PO TABS: 20.0000 mg | ORAL_TABLET | Freq: Every day | ORAL | 0 refills | Status: DC

## 2024-01-23 NOTE — Telephone Encounter (Signed)
Refilled medication for 30 days. 

## 2024-01-23 NOTE — Telephone Encounter (Signed)
 FYI Only or Action Required?: Action required by provider: medication refill request.  Patient was last seen in primary care on 06/03/2023 by Wellington Curtis LABOR, FNP.  Called Nurse Triage reporting Medication Refill.  Triage Disposition: Call PCP When Office is Open  Patient/caregiver understands and will follow disposition?: Yes                 Copied from CRM #[id number]. Topic: Clinical - Red Word Triage >> Jan 23, 2024  9:28 AM Diana Blanchard wrote: Red Word that prompted transfer to Nurse Triage: Patient is having some mental health issues such as stress and crying. Pt is currently of her escitalopram  (LEXAPRO ) 20 MG tablet Reason for Disposition  [1] Prescription refill request for NON-ESSENTIAL medicine (i.e., no harm to patient if med not taken) AND [2] triager unable to refill per department policy  Answer Assessment - Initial Assessment Questions Pt upset stating she called on Monday and has called three times. Pt states she is out of medication and wants it sent to pharmacy.   [id number] [place], [place] - [address] AT [facility] OF [address] [facility] [address] [place], [place] [address] Phone: [phone]  Fax: [phone]   DRUG NAME: What medicine do you need to have refilled?     Lexapro  20 mg  Protocols used: Medication Refill and Renewal Call-A-AH

## 2024-02-22 ENCOUNTER — Other Ambulatory Visit: Payer: Self-pay | Admitting: Physician Assistant

## 2024-02-22 DIAGNOSIS — F331 Major depressive disorder, recurrent, moderate: Secondary | ICD-10-CM

## 2024-02-25 DIAGNOSIS — H26493 Other secondary cataract, bilateral: Secondary | ICD-10-CM | POA: Diagnosis not present

## 2024-02-25 NOTE — Progress Notes (Signed)
 Established patient visit  Patient: Diana Blanchard   DOB: [birth year]   78 y.o. Female  MRN: [id number] Visit Date: 02/26/2024  Today's healthcare provider: Jolynn Spencer, PA-C   No chief complaint on file.  Subjective       Discussed the use of AI scribe software for clinical note transcription with the patient, who gave verbal consent to proceed.  History of Present Illness        08/12/2023   10:39 AM 03/05/2023    1:20 PM 07/26/2022   10:16 AM  Depression screen PHQ 2/9  Decreased Interest 0 1 0  Down, Depressed, Hopeless 1 1 1   PHQ - 2 Score 1 2 1   Altered sleeping 0 0 0  Tired, decreased energy 0 0 0  Change in appetite 0 0 0  Feeling bad or failure about yourself  0 0 0  Trouble concentrating 0 0 0  Moving slowly or fidgety/restless 0 0 0  Suicidal thoughts 0 0 0  PHQ-9 Score 1 2 1   Difficult doing work/chores  Not difficult at all Not difficult at all      08/12/2023   10:55 AM 03/05/2023    1:19 PM 01/13/2020    5:56 PM  GAD 7 : Generalized Anxiety Score  Nervous, Anxious, on Edge 0 1 1  Control/stop worrying 0 1 1  Worry too much - different things 1 1 1   Trouble relaxing 0 1 0  Restless 0 0 0  Easily annoyed or irritable 0 0 1  Afraid - awful might happen 0 0 0  Total GAD 7 Score 1 4 4   Anxiety Difficulty Not difficult at all  Somewhat difficult    Medications: Outpatient Medications Prior to Visit  Medication Sig   benzocaine  (AMERICAINE) 20 % rectal ointment Place rectally every 3 (three) hours as needed for pain.   buPROPion  (WELLBUTRIN  XL) 150 MG 24 hr tablet TAKE 1 TABLET(150 MG) BY MOUTH DAILY   escitalopram  (LEXAPRO ) 20 MG tablet TAKE 1 TABLET(20 MG) BY MOUTH DAILY   ezetimibe  (ZETIA ) 10 MG tablet Take 1 tablet (10 mg total) by mouth daily.   Multiple Vitamins-Minerals (WOMENS 50+ MULTI VITAMIN/MIN) TABS Take by mouth daily.   mupirocin  ointment (BACTROBAN ) 2 % Apply 1 Application topically 2 (two) times daily. Apply to R ring finger    simvastatin  (ZOCOR ) 40 MG tablet TAKE 1 TABLET BY MOUTH DAILY   valACYclovir  (VALTREX ) 500 MG tablet TAKE 1 TABLET(500 MG) BY MOUTH TWICE DAILY AS NEEDED   No facility-administered medications prior to visit.    Review of Systems  All other systems reviewed and are negative.  All negative Except see HPI   {Insert previous labs (optional):23779} {See past labs  Heme  Chem  Endocrine  Serology  Results Review (optional):1}   Objective    There were no vitals taken for this visit. {Insert last BP/Wt (optional):23777}{See vitals history (optional):1}   Physical Exam Vitals reviewed.  Constitutional:      General: She is not in acute distress.    Appearance: Normal appearance. She is well-developed. She is not diaphoretic.  HENT:     Head: Normocephalic and atraumatic.  Eyes:     General: No scleral icterus.    Conjunctiva/sclera: Conjunctivae normal.  Neck:     Thyroid : No thyromegaly.  Cardiovascular:     Rate and Rhythm: Normal rate and regular rhythm.     Pulses: Normal pulses.     Heart sounds: Normal heart sounds. No murmur heard. Pulmonary:  Effort: Pulmonary effort is normal. No respiratory distress.     Breath sounds: Normal breath sounds. No wheezing, rhonchi or rales.  Musculoskeletal:     Cervical back: Neck supple.     Right lower leg: No edema.     Left lower leg: No edema.  Lymphadenopathy:     Cervical: No cervical adenopathy.  Skin:    General: Skin is warm and dry.     Findings: No rash.  Neurological:     Mental Status: She is alert and oriented to person, place, and time. Mental status is at baseline.  Psychiatric:        Mood and Affect: Mood normal.        Behavior: Behavior normal.      No results found for any visits on 02/26/24.      Assessment and Plan Assessment & Plan     No orders of the defined types were placed in this encounter.   No follow-ups on file.   The patient was advised to call back or seek an in-person  evaluation if the symptoms worsen or if the condition fails to improve as anticipated.  I discussed the assessment and treatment plan with the patient. The patient was provided an opportunity to ask questions and all were answered. The patient agreed with the plan and demonstrated an understanding of the instructions.  I, Yosgar Demirjian, PA-C have reviewed all documentation for this visit. The documentation on 02/26/2024  for the exam, diagnosis, procedures, and orders are all accurate and complete.  Jolynn Spencer, [facility], MMS [facility] [phone] (phone) [phone] (fax)  [facility]

## 2024-02-26 ENCOUNTER — Ambulatory Visit (INDEPENDENT_AMBULATORY_CARE_PROVIDER_SITE_OTHER): Admitting: Physician Assistant

## 2024-02-26 VITALS — BP 130/85 | HR 68 | Resp 14 | Ht 66.0 in | Wt 144.0 lb

## 2024-02-26 DIAGNOSIS — Z9889 Other specified postprocedural states: Secondary | ICD-10-CM

## 2024-02-26 DIAGNOSIS — R7303 Prediabetes: Secondary | ICD-10-CM

## 2024-02-26 DIAGNOSIS — M25541 Pain in joints of right hand: Secondary | ICD-10-CM

## 2024-02-26 DIAGNOSIS — G5600 Carpal tunnel syndrome, unspecified upper limb: Secondary | ICD-10-CM

## 2024-02-26 DIAGNOSIS — N1831 Chronic kidney disease, stage 3a: Secondary | ICD-10-CM

## 2024-02-26 DIAGNOSIS — E7800 Pure hypercholesterolemia, unspecified: Secondary | ICD-10-CM | POA: Diagnosis not present

## 2024-02-26 DIAGNOSIS — K648 Other hemorrhoids: Secondary | ICD-10-CM

## 2024-02-26 DIAGNOSIS — M25542 Pain in joints of left hand: Secondary | ICD-10-CM

## 2024-02-26 DIAGNOSIS — R030 Elevated blood-pressure reading, without diagnosis of hypertension: Secondary | ICD-10-CM | POA: Diagnosis not present

## 2024-02-26 DIAGNOSIS — F331 Major depressive disorder, recurrent, moderate: Secondary | ICD-10-CM | POA: Diagnosis not present

## 2024-02-26 DIAGNOSIS — K625 Hemorrhage of anus and rectum: Secondary | ICD-10-CM

## 2024-02-26 DIAGNOSIS — R21 Rash and other nonspecific skin eruption: Secondary | ICD-10-CM

## 2024-02-26 MED ORDER — ESCITALOPRAM OXALATE 20 MG PO TABS: 20.0000 mg | ORAL_TABLET | Freq: Every day | ORAL | 3 refills | Status: AC

## 2024-03-01 DIAGNOSIS — E7800 Pure hypercholesterolemia, unspecified: Secondary | ICD-10-CM | POA: Diagnosis not present

## 2024-03-01 DIAGNOSIS — M25541 Pain in joints of right hand: Secondary | ICD-10-CM | POA: Diagnosis not present

## 2024-03-01 DIAGNOSIS — R7303 Prediabetes: Secondary | ICD-10-CM | POA: Diagnosis not present

## 2024-03-01 DIAGNOSIS — M25542 Pain in joints of left hand: Secondary | ICD-10-CM | POA: Diagnosis not present

## 2024-03-02 LAB — LIPID PANEL
Chol/HDL Ratio: 2.4 ratio (ref 0.0–4.4)
Cholesterol, Total: 172 mg/dL (ref 100–199)
HDL: 71 mg/dL (ref >39)
LDL Chol Calc (NIH): 84 mg/dL (ref 0–99)
Triglycerides: 91 mg/dL (ref 0–149)
VLDL Cholesterol Cal: 17 mg/dL (ref 5–40)

## 2024-03-02 LAB — BASIC METABOLIC PANEL WITH GFR
BUN/Creatinine Ratio: 14 (ref 12–28)
BUN: 14 mg/dL (ref 8–27)
CO2: 22 mmol/L (ref 20–29)
Calcium: 9.7 mg/dL (ref 8.7–10.3)
Chloride: 102 mmol/L (ref 96–106)
Creatinine, Ser: 1.01 mg/dL — ABNORMAL HIGH (ref 0.57–1.00)
Glucose: 90 mg/dL (ref 70–99)
Potassium: 4.6 mmol/L (ref 3.5–5.2)
Sodium: 140 mmol/L (ref 134–144)
eGFR: 58 mL/min/1.73 — ABNORMAL LOW (ref >59)

## 2024-03-02 LAB — HEMOGLOBIN A1C
Est. average glucose Bld gHb Est-mCnc: 126 mg/dL
Hgb A1c MFr Bld: 6 % — ABNORMAL HIGH (ref 4.8–5.6)

## 2024-03-02 LAB — SEDIMENTATION RATE: Sed Rate: 2 mm/h (ref 0–40)

## 2024-03-02 LAB — C-REACTIVE PROTEIN: CRP: <1 mg/L (ref 0–10)

## 2024-03-02 LAB — RHEUMATOID FACTOR: Rheumatoid fact SerPl-aCnc: <10 [IU]/mL (ref <14.0)

## 2024-03-02 LAB — ANA W/REFLEX IF POSITIVE: Anti Nuclear Antibody (ANA): NEGATIVE

## 2024-03-02 LAB — URIC ACID: Uric Acid: 5.8 mg/dL (ref 3.1–7.9)

## 2024-03-03 ENCOUNTER — Ambulatory Visit: Payer: Self-pay | Admitting: Physician Assistant

## 2024-03-24 DIAGNOSIS — H43811 Vitreous degeneration, right eye: Secondary | ICD-10-CM | POA: Diagnosis not present

## 2024-04-08 ENCOUNTER — Encounter: Payer: Self-pay | Admitting: Dermatology

## 2024-04-08 ENCOUNTER — Ambulatory Visit (INDEPENDENT_AMBULATORY_CARE_PROVIDER_SITE_OTHER): Admitting: Dermatology

## 2024-04-08 DIAGNOSIS — C44311 Basal cell carcinoma of skin of nose: Secondary | ICD-10-CM

## 2024-04-08 DIAGNOSIS — W908XXA Exposure to other nonionizing radiation, initial encounter: Secondary | ICD-10-CM

## 2024-04-08 DIAGNOSIS — L578 Other skin changes due to chronic exposure to nonionizing radiation: Secondary | ICD-10-CM | POA: Diagnosis not present

## 2024-04-08 DIAGNOSIS — L814 Other melanin hyperpigmentation: Secondary | ICD-10-CM

## 2024-04-08 DIAGNOSIS — L821 Other seborrheic keratosis: Secondary | ICD-10-CM | POA: Diagnosis not present

## 2024-04-08 DIAGNOSIS — L820 Inflamed seborrheic keratosis: Secondary | ICD-10-CM

## 2024-04-08 DIAGNOSIS — D492 Neoplasm of unspecified behavior of bone, soft tissue, and skin: Secondary | ICD-10-CM

## 2024-04-08 DIAGNOSIS — C4491 Basal cell carcinoma of skin, unspecified: Secondary | ICD-10-CM

## 2024-04-08 HISTORY — DX: Basal cell carcinoma of skin, unspecified: C44.91

## 2024-04-08 NOTE — Patient Instructions (Addendum)

## 2024-04-08 NOTE — Progress Notes (Signed)
 Follow-Up Visit   Subjective  Diana Blanchard is a 78 y.o. female who presents for the following: The patient has spots, moles and lesions to be evaluated, some may be new or changing and the patient may have concern these could be cancer.  6 months f/u on a spot on her left nose she had treated with LN2- never went away.   The following portions of the chart were reviewed this encounter and updated as appropriate: medications, allergies, medical history  Review of Systems:  No other skin or systemic complaints except as noted in HPI or Assessment and Plan.  Objective  Well appearing patient in no apparent distress; mood and affect are within normal limits.  A focused examination was performed of the following areas: Face,chest,back,neck,arms   Relevant exam findings are noted in the Assessment and Plan.  L nasal ala Crusted papule  neck, shoulder, right arm x 17 (17) Stuck-on, waxy, tan-brown papule or plaque --Discussed benign etiology and prognosis.    Assessment & Plan   ACTINIC DAMAGE - chronic, secondary to cumulative UV radiation exposure/sun exposure over time - diffuse scaly erythematous macules with underlying dyspigmentation - Recommend daily broad spectrum sunscreen SPF 30+ to sun-exposed areas, reapply every 2 hours as needed.  - Recommend staying in the shade or wearing long sleeves, sun glasses (UVA+UVB protection) and wide brim hats (4-inch brim around the entire circumference of the hat). - Call for new or changing lesions.   LENTIGINES Exam: scattered tan macules Due to sun exposure Treatment Plan: Benign-appearing, observe. Recommend daily broad spectrum sunscreen SPF 30+ to sun-exposed areas, reapply every 2 hours as needed.  Call for any changes   SEBORRHEIC KERATOSIS - Stuck-on, waxy, tan-brown papules and/or plaques  - Benign-appearing - Discussed benign etiology and prognosis. - Observe - Call for any changes  NEOPLASM OF SKIN L nasal  ala Epidermal / dermal shaving  Lesion diameter (cm):  0.7 Informed consent: discussed and consent obtained   Timeout: patient name, date of birth, surgical site, and procedure verified   Procedure prep:  Patient was prepped and draped in usual sterile fashion Prep type:  Isopropyl alcohol Anesthesia: the lesion was anesthetized in a standard fashion   Anesthetic:  1% lidocaine  w/ epinephrine  1-100,000 buffered w/ 8.4% NaHCO3 Hemostasis achieved with: pressure, aluminum chloride and electrodesiccation   Outcome: patient tolerated procedure well   Post-procedure details: sterile dressing applied and wound care instructions given   Dressing type: bandage and petrolatum    Destruction of lesion Complexity: extensive   Destruction method: electrodesiccation and curettage   Informed consent: discussed and consent obtained   Timeout:  patient name, date of birth, surgical site, and procedure verified Procedure prep:  Patient was prepped and draped in usual sterile fashion Prep type:  Isopropyl alcohol Anesthesia: the lesion was anesthetized in a standard fashion   Anesthetic:  1% lidocaine  w/ epinephrine  1-100,000 buffered w/ 8.4% NaHCO3 Curettage performed in three different directions: Yes   Electrodesiccation performed over the curetted area: Yes   Lesion length (cm):  0.7 Lesion width (cm):  0.7 Margin per side (cm):  0.2 Final wound size (cm):  1.1 Hemostasis achieved with:  pressure, aluminum chloride and electrodesiccation Outcome: patient tolerated procedure well with no complications   Post-procedure details: sterile dressing applied and wound care instructions given   Dressing type: bandage and petrolatum   Additional details:  Treated with EDC   Specimen 1 - Surgical pathology Differential Diagnosis: R/O Skin cancer   Check  Margins: No INFLAMED SEBORRHEIC KERATOSIS (17) neck, shoulder, right arm x 17 (17) Symptomatic, irritating, patient would like treated.   Destruction of lesion - neck, shoulder, right arm x 17 (17) Complexity: simple   Destruction method: cryotherapy   Informed consent: discussed and consent obtained   Timeout:  patient name, date of birth, surgical site, and procedure verified Lesion destroyed using liquid nitrogen: Yes   Region frozen until ice ball extended beyond lesion: Yes   Outcome: patient tolerated procedure well with no complications   Post-procedure details: wound care instructions given    ACTINIC SKIN DAMAGE   SEBORRHEIC KERATOSIS   LENTIGO    Return in about 6 months (around 10/07/2024) for scheduled appt October 07, 2024.  IFay Kirks, CMA, am acting as scribe for Alm Rhyme, MD .   Documentation: I have reviewed the above documentation for accuracy and completeness, and I agree with the above.  Alm Rhyme, MD

## 2024-04-13 ENCOUNTER — Ambulatory Visit: Payer: Self-pay | Admitting: Dermatology

## 2024-04-13 LAB — SURGICAL PATHOLOGY

## 2024-04-14 ENCOUNTER — Encounter: Payer: Self-pay | Admitting: Dermatology

## 2024-04-14 NOTE — Telephone Encounter (Signed)
 Advised pt of bx results.  Advised pt we would recheck area at f/u appointment./sh

## 2024-04-14 NOTE — Telephone Encounter (Signed)
-----   Message from Alm Rhyme sent at 04/13/2024  5:31 PM EDT ----- FINAL DIAGNOSIS        1. Skin, left nasal ala :       BASAL CELL CARCINOMA, NODULAR PATTERN   Cancer = BCC Already treated Recheck next visit ----- Message ----- From: Interface, Lab In Three Zero One Sent: 04/13/2024   4:46 PM EDT To: Alm JAYSON Rhyme, MD

## 2024-05-20 ENCOUNTER — Other Ambulatory Visit: Payer: Self-pay | Admitting: Physician Assistant

## 2024-05-20 DIAGNOSIS — F331 Major depressive disorder, recurrent, moderate: Secondary | ICD-10-CM

## 2024-05-21 ENCOUNTER — Other Ambulatory Visit: Payer: Self-pay | Admitting: Physician Assistant

## 2024-05-21 DIAGNOSIS — E7800 Pure hypercholesterolemia, unspecified: Secondary | ICD-10-CM

## 2024-06-01 ENCOUNTER — Other Ambulatory Visit: Payer: Self-pay | Admitting: Physician Assistant

## 2024-06-01 DIAGNOSIS — B009 Herpesviral infection, unspecified: Secondary | ICD-10-CM

## 2024-07-08 ENCOUNTER — Other Ambulatory Visit: Payer: Self-pay | Admitting: Physician Assistant

## 2024-07-08 DIAGNOSIS — F331 Major depressive disorder, recurrent, moderate: Secondary | ICD-10-CM

## 2024-07-08 MED ORDER — BUPROPION HCL ER (XL) 150 MG PO TB24: ORAL_TABLET | ORAL | 0 refills | Status: AC

## 2024-07-08 NOTE — Telephone Encounter (Signed)
 Copied from CRM #[id number]. Topic: Clinical - Medication Refill >> Jul 08, 2024 10:45 AM Antwanette L wrote: Medication: buPROPion  (WELLBUTRIN  XL) 150 MG 24 hr tablet  Has the patient contacted their pharmacy? Yes   This is the patient's preferred pharmacy:  [facility] #[id number] - [place],  - [address] AT [facility] OF [address] [place] [address] [place] [address] Phone: [phone] Fax: [phone]  Is this the correct pharmacy for this prescription? Yes    Has the prescription been filled recently? Yes.Last refill was 12/24/23  Is the patient out of the medication? Yes  Has the patient been seen for an appointment in the last year OR does the patient have an upcoming appointment? Yes. Last office visit with Benedict Spencer PA-c was 02/26/24 and next app is 09/08/24  Can we respond through MyChart? Yes  Agent: Please be advised that Rx refills may take up to 3 business days. We ask that you follow-up with your pharmacy. >> Jul 08, 2024 10:50 AM Antwanette L wrote: Patient reports she was informed by the pharmacy that her refill would be ready yesterday. Her husband went to pick up the medication but was told it was not available and that the order had been cancelled. Pharmacy advised that the patient needs to contact the provider.

## 2024-07-08 NOTE — Telephone Encounter (Signed)
 Requested Prescriptions  Pending Prescriptions Disp Refills   buPROPion  (WELLBUTRIN  XL) 150 MG 24 hr tablet 90 tablet 0    Sig: TAKE 1 TABLET(150 MG) BY MOUTH DAILY     Psychiatry: Antidepressants - bupropion  Failed - 07/08/2024  3:47 PM      Failed - Cr in normal range and within 360 days    Creatinine, Ser  Date Value Ref Range Status  03/01/2024 1.01 (H) 0.57 - 1.00 mg/dL Final         Passed - AST in normal range and within 360 days    AST  Date Value Ref Range Status  09/15/2023 19 0 - 40 IU/L Final         Passed - ALT in normal range and within 360 days    ALT  Date Value Ref Range Status  09/15/2023 7 0 - 32 IU/L Final         Passed - Completed PHQ-2 or PHQ-9 in the last 360 days      Passed - Last BP in normal range    BP Readings from Last 1 Encounters:  02/26/24 130/85         Passed - Valid encounter within last 6 months    Recent Outpatient Visits           4 months ago Prediabetes   [facility] [place], [place], PA-C   10 months ago Rectal bleeding   [facility] [place], [place], PA-C   11 months ago 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

## 2024-08-25 ENCOUNTER — Ambulatory Visit: Admitting: Physician Assistant

## 2024-09-08 ENCOUNTER — Ambulatory Visit: Admitting: Physician Assistant

## 2024-10-07 ENCOUNTER — Ambulatory Visit: Admitting: Dermatology
